# Patient Record
Sex: Male | Born: 1960 | Race: White | Hispanic: No | Marital: Married | Smoking: Never smoker
Health system: Southern US, Community
[De-identification: ages and names within clinical notes are randomized; demographics above are authoritative.]

## PROBLEM LIST (undated history)

## (undated) DIAGNOSIS — I639 Cerebral infarction, unspecified: Secondary | ICD-10-CM

## (undated) DIAGNOSIS — H3320 Serous retinal detachment, unspecified eye: Secondary | ICD-10-CM

## (undated) DIAGNOSIS — J349 Unspecified disorder of nose and nasal sinuses: Secondary | ICD-10-CM

## (undated) DIAGNOSIS — R002 Palpitations: Secondary | ICD-10-CM

## (undated) DIAGNOSIS — H269 Unspecified cataract: Secondary | ICD-10-CM

## (undated) DIAGNOSIS — G459 Transient cerebral ischemic attack, unspecified: Secondary | ICD-10-CM

## (undated) HISTORY — PX: RETINAL DETACHMENT SURGERY: SHX105

## (undated) HISTORY — PX: HAND SURGERY: SHX662

## (undated) HISTORY — PX: CATARACT EXTRACTION: SUR2

## (undated) HISTORY — PX: EYE SURGERY: SHX253

---

## 2013-02-02 ENCOUNTER — Ambulatory Visit: Payer: Self-pay | Admitting: Ophthalmology

## 2014-04-17 ENCOUNTER — Emergency Department: Payer: Self-pay | Admitting: Emergency Medicine

## 2014-04-17 LAB — CBC WITH DIFFERENTIAL/PLATELET
Basophil #: 0.1 10*3/uL (ref 0.0–0.1)
Basophil %: 0.3 %
EOS ABS: 0.1 10*3/uL (ref 0.0–0.7)
EOS PCT: 0.4 %
HCT: 53 % — AB (ref 40.0–52.0)
HGB: 17.1 g/dL (ref 13.0–18.0)
LYMPHS ABS: 0.5 10*3/uL — AB (ref 1.0–3.6)
Lymphocyte %: 2.4 %
MCH: 28 pg (ref 26.0–34.0)
MCHC: 32.3 g/dL (ref 32.0–36.0)
MCV: 87 fL (ref 80–100)
MONO ABS: 0.9 x10 3/mm (ref 0.2–1.0)
Monocyte %: 4.9 %
NEUTROS PCT: 92 %
Neutrophil #: 17.5 10*3/uL — ABNORMAL HIGH (ref 1.4–6.5)
PLATELETS: 309 10*3/uL (ref 150–440)
RBC: 6.1 10*6/uL — AB (ref 4.40–5.90)
RDW: 12.9 % (ref 11.5–14.5)
WBC: 19.1 10*3/uL — ABNORMAL HIGH (ref 3.8–10.6)

## 2014-04-17 LAB — COMPREHENSIVE METABOLIC PANEL
ALBUMIN: 4.6 g/dL (ref 3.4–5.0)
ALT: 27 U/L
ANION GAP: 15 (ref 7–16)
AST: 24 U/L (ref 15–37)
Alkaline Phosphatase: 80 U/L
BUN: 24 mg/dL — ABNORMAL HIGH (ref 7–18)
Bilirubin,Total: 0.9 mg/dL (ref 0.2–1.0)
Calcium, Total: 9.8 mg/dL (ref 8.5–10.1)
Chloride: 104 mmol/L (ref 98–107)
Co2: 20 mmol/L — ABNORMAL LOW (ref 21–32)
Creatinine: 1.39 mg/dL — ABNORMAL HIGH (ref 0.60–1.30)
EGFR (Non-African Amer.): 57 — ABNORMAL LOW
Glucose: 190 mg/dL — ABNORMAL HIGH (ref 65–99)
OSMOLALITY: 287 (ref 275–301)
Potassium: 3.9 mmol/L (ref 3.5–5.1)
Sodium: 139 mmol/L (ref 136–145)
TOTAL PROTEIN: 8.4 g/dL — AB (ref 6.4–8.2)

## 2014-04-17 LAB — TROPONIN I

## 2014-04-17 LAB — LIPASE, BLOOD: Lipase: 239 U/L (ref 73–393)

## 2015-06-21 ENCOUNTER — Ambulatory Visit: Payer: Self-pay | Admitting: Unknown Physician Specialty

## 2016-04-12 ENCOUNTER — Encounter: Payer: Self-pay | Admitting: Emergency Medicine

## 2016-04-12 ENCOUNTER — Emergency Department
Admission: EM | Admit: 2016-04-12 | Discharge: 2016-04-12 | Payer: BLUE CROSS/BLUE SHIELD | Attending: Emergency Medicine | Admitting: Emergency Medicine

## 2016-04-12 DIAGNOSIS — H5340 Unspecified visual field defects: Secondary | ICD-10-CM

## 2016-04-12 DIAGNOSIS — H5460 Unqualified visual loss, one eye, unspecified: Secondary | ICD-10-CM | POA: Diagnosis present

## 2016-04-12 HISTORY — DX: Serous retinal detachment, unspecified eye: H33.20

## 2016-04-12 NOTE — ED Triage Notes (Signed)
L eye partial loss of vision began last night, has history of detatched retinas.

## 2016-04-12 NOTE — ED Notes (Signed)
Pt D/C into the care of his wife. This RN discussed with patient the necessity to go straight to [facility] for follow up. This RN also instructed patient not to eat or drink anything until further evaluation at [facility] and not to drive due to the eye patch. Pt's wife at bedside for D/C instructions, pt states understanding at this time. During D/C patient noted to be angry when this RN asked for final set of vitals and to sign consent to transfer. Pt states "You just did that why do you have to do it again?" This RN explained policy to patient who states understanding.

## 2016-04-12 NOTE — ED Provider Notes (Addendum)
[facility] Emergency Department Provider Note  ____________________________________________   I have reviewed the triage vital signs and the nursing notes.   HISTORY  Chief Complaint Loss of Vision    HPI Chris Shelton is a 54 y.o. male who presents today with a partial vision loss in the left eye. Patient is otherwise a baseline healthy person. He states that he was in his normal state of health last night when he began to see floaters" lightening" in the left eye similar to when he had retinal detachment on the right. He has had 2 different surgeries on the right eye for retinal detachment. His baseline vision is about 2030 2040 on the right and 20/30 on the left. This painless loss of vision progressed overnight and now he has a part of his vision missing specifically on the upper medial quadrant of the left eye. He denies any loss of vision the right eye or any other complaints the right eye itself. He denies any focal numbness or weakness or difficulty speaking headache or head trauma. No vomiting.    Past Medical History:  Diagnosis Date  . Detached retina     There are no active problems to display for this patient.   Past Surgical History:  Procedure Laterality Date  . EYE SURGERY      Prior to Admission medications   Not on File    Allergies Penicillins  No family history on file.  Social History Social History  Substance Use Topics  . Smoking status: Not on file  . Smokeless tobacco: Not on file  . Alcohol use Not on file    Review of Systems Constitutional: No fever/chills Eyes: No visual changes. ENT: No sore throat. No stiff neck no neck pain Cardiovascular: Denies chest pain. Respiratory: Denies shortness of breath. Gastrointestinal:   no vomiting.  No diarrhea.  No constipation. Genitourinary: Negative for dysuria. Musculoskeletal: Negative lower extremity swelling Skin: Negative for rash. Neurological: Negative for  severe headaches, focal weakness or numbness. 10-point ROS otherwise negative.  ____________________________________________   PHYSICAL EXAM:  VITAL SIGNS: ED Triage Vitals  Enc Vitals Group     BP 04/12/16 1235 (!) 141/68     Pulse Rate 04/12/16 1235 65     Resp 04/12/16 1235 20     Temp 04/12/16 1235 98.1 F (36.7 C)     Temp Source 04/12/16 1235 Oral     SpO2 04/12/16 1235 97 %     Weight 04/12/16 1237 225 lb (102.1 kg)     Height 04/12/16 1237 5\' 9"  (1.753 m)     Head Circumference --      Peak Flow --      Pain Score 04/12/16 1237 1     Pain Loc --      Pain Edu? --      Excl. in GC? --     Constitutional: Alert and oriented. Well appearing and in no acute distress. Eyes: Conjunctivae are normal. PERRL. EOMI.  Endoscopic exam is limited but no obvious pathology noted without dilation  Head: Atraumatic. Nose: No congestion/rhinnorhea. Mouth/Throat: Mucous membranes are moist.  Oropharynx non-erythematous. Neck: No stridor.   Nontender with no meningismus Cardiovascular: Normal rate, regular rhythm. Grossly normal heart sounds.  Good peripheral circulation. Respiratory: Normal respiratory effort.  No retractions. Lungs CTAB. Abdominal: Soft and nontender. No distention. No guarding no rebound Back:  There is no focal tenderness or step off.  there is no midline tenderness there are no lesions  noted. there is no CVA tenderness Musculoskeletal: No lower extremity tenderness, no upper extremity tenderness. No joint effusions, no DVT signs strong distal pulses no edema Neurologic:  Cranial nerves II through XII are grossly intact 5 out of 5 strength bilateral upper and lower extremity. Finger to nose within normal limits heel to shin within normal limits, speech is normal with no word finding difficulty or dysarthria, reflexes symmetric, pupils are equally round and reactive to light, there is no pronator drift, sensation is normal, vision is intact to confrontation in all  fields except for the medial upper quadrant of the left eye, gait is deferred, there is no nystagmus,  Skin:  Skin is warm, dry and intact. No rash noted. Psychiatric: Mood and affect are normal. Speech and behavior are normal.  ____________________________________________   LABS (all labs ordered are listed, but only abnormal results are displayed)  Labs Reviewed - No data to display ____________________________________________  EKG  I personally interpreted any EKGs ordered by me or triage  ____________________________________________  RADIOLOGY  I reviewed any imaging ordered by me or triage that were performed during my shift and, if possible, patient and/or family made aware of any abnormal findings. ____________________________________________   PROCEDURES  Procedure(s) performed: None  Procedures  Critical Care performed: None  ____________________________________________   INITIAL IMPRESSION / ASSESSMENT AND PLAN / ED COURSE  Pertinent labs & imaging results that were available during my care of the patient were reviewed by me and considered in my medical decision making (see chart for details).   pt with a unilateral painless loss of vision the the medial upper visual field of the left eye assoc with floaters and lightning  with no other neurological finding. I d/w Dr. Melanie CrazierPorfillio, who agrees that this is likely retinal detachment, but as they no longer have a retinal surgeon they suggested transfer. I [facility] to [facility] and their retinal specialist is not available.  They will call be back if they can be of help.  Pt in nad.  His vision at this time is 20/40 R 20/50 L   Low suspicion for cva and he would be outside the window for tpa even if it were.     ----------------------------------------- 2:10 PM on 04/12/2016 ----------------------------------------- [facility] that [facility] call [facility].  ----------------------------------------- 2:25  PM on 04/12/2016 -----------------------------------------  D/w daniel lattin md of [facility] ophthalmology who agrees with management, would like an eye shield and accepts he patient ED to ED transfer.  ----------------------------------------- 2:28 PM on 04/12/2016 -----------------------------------------  Patient has been given an eye shield, he would prefer to go by private vehicle he does not wish to go by ambulance. This is not unreasonable. Patient's wife will drive him. We will still fill out [facility] paperwork. He will essentially however be discharged from us with instructions to follow up immediately with them. The patient is calling his wife for a ride. He is no acute distress with no change in his status.  Clinical Course   ____________________________________________   FINAL CLINICAL IMPRESSION(S) / ED DIAGNOSES  Final diagnoses:  None      This chart was dictated using voice recognition software.  Despite best efforts to proofread,  errors can occur which can change meaning.      Jeanmarie PlantJames A Jentry Mcqueary, MD 04/12/16 1343    Jeanmarie PlantJames A Yossi Hinchman, MD 04/12/16 1410    Jeanmarie PlantJames A Joy Reiger, MD 04/12/16 1429    Jeanmarie PlantJames A Alesha Jaffee, MD 04/12/16 2043

## 2016-04-12 NOTE — Discharge Instructions (Signed)
Go directly to [facility] Department and make them aware that you are requested in transfer by ophthalmology for possible retinal detachment. If you have any new or worrisome symptoms on the way, seek emergency care. Do not drive. As they may need to do surgery, do not stop to eat or drink.

## 2016-04-12 NOTE — ED Notes (Signed)
Eye pad and eye shield applied per dr Alphonzo Lemmingsmcshane request

## 2016-04-12 NOTE — ED Notes (Signed)
This RN to bedside to inform patient that per MD he is not to eat or drink until he is evaluated at [facility], and also that he will need to wait until his wife arrives before he can leave due to his driving restrictions. Pt noted to be agitated at this time stating, "I'm just ready to leave, I'm just ready to get this over with". Pt noted to continue to wait in room, this RN informed him as soon as his wife arrived to drive him he would receive his D/C paperwork.

## 2017-06-23 DIAGNOSIS — H269 Unspecified cataract: Secondary | ICD-10-CM

## 2017-06-23 DIAGNOSIS — J349 Unspecified disorder of nose and nasal sinuses: Secondary | ICD-10-CM

## 2017-06-23 HISTORY — DX: Unspecified disorder of nose and nasal sinuses: J34.9

## 2017-06-23 HISTORY — DX: Unspecified cataract: H26.9

## 2017-08-17 ENCOUNTER — Encounter: Payer: Self-pay | Admitting: *Deleted

## 2017-08-25 ENCOUNTER — Encounter
Admission: RE | Disposition: A | Discharge status: Home / Self Care | Payer: Self-pay | Source: Ambulatory Visit | Attending: Ophthalmology

## 2017-08-25 ENCOUNTER — Ambulatory Visit
Admission: RE | Admit: 2017-08-25 | Discharge: 2017-08-25 | Disposition: A | Discharge status: Home / Self Care | Payer: BLUE CROSS/BLUE SHIELD | Source: Ambulatory Visit | Attending: Ophthalmology | Admitting: Ophthalmology

## 2017-08-25 ENCOUNTER — Ambulatory Visit: Payer: BLUE CROSS/BLUE SHIELD | Admitting: Certified Registered Nurse Anesthetist

## 2017-08-25 DIAGNOSIS — H2512 Age-related nuclear cataract, left eye: Secondary | ICD-10-CM | POA: Diagnosis present

## 2017-08-25 DIAGNOSIS — F419 Anxiety disorder, unspecified: Secondary | ICD-10-CM | POA: Diagnosis not present

## 2017-08-25 DIAGNOSIS — Z880 Allergy status to penicillin: Secondary | ICD-10-CM | POA: Insufficient documentation

## 2017-08-25 HISTORY — PX: CATARACT EXTRACTION W/PHACO: SHX586

## 2017-08-25 HISTORY — DX: Unspecified cataract: H26.9

## 2017-08-25 HISTORY — DX: Unspecified disorder of nose and nasal sinuses: J34.9

## 2017-08-25 HISTORY — DX: Palpitations: R00.2

## 2017-08-25 SURGERY — PHACOEMULSIFICATION, CATARACT, WITH IOL INSERTION
Anesthesia: Monitor Anesthesia Care | Site: Eye | Laterality: Left | Wound class: Clean

## 2017-08-25 MED ORDER — CARBACHOL 0.01 % IO SOLN
INTRAOCULAR | Status: DC | PRN
  Administered 2017-08-25: .2 mL via INTRAOCULAR

## 2017-08-25 MED ORDER — LIDOCAINE HCL (PF) 4 % IJ SOLN
INTRAMUSCULAR | Status: AC
  Filled 2017-08-25: qty 5

## 2017-08-25 MED ORDER — ARMC OPHTHALMIC DILATING DROPS
OPHTHALMIC | Status: AC
  Administered 2017-08-25: 1 via OPHTHALMIC
  Filled 2017-08-25: qty 0.4

## 2017-08-25 MED ORDER — POVIDONE-IODINE 5 % OP SOLN
OPHTHALMIC | Status: DC | PRN
  Administered 2017-08-25: 1 via OPHTHALMIC

## 2017-08-25 MED ORDER — ARMC OPHTHALMIC DILATING DROPS
1.0000 "application " | OPHTHALMIC | Status: AC
  Administered 2017-08-25 (×3): 1 via OPHTHALMIC

## 2017-08-25 MED ORDER — TRYPAN BLUE 0.06 % OP SOLN
OPHTHALMIC | Status: DC | PRN
  Administered 2017-08-25: 0.5 mL via INTRAOCULAR

## 2017-08-25 MED ORDER — LIDOCAINE HCL (PF) 4 % IJ SOLN
INTRAOCULAR | Status: DC | PRN
  Administered 2017-08-25: .1 mL via OPHTHALMIC

## 2017-08-25 MED ORDER — EPINEPHRINE PF 1 MG/ML IJ SOLN
INTRAOCULAR | Status: DC | PRN
  Administered 2017-08-25: 250 mL via OPHTHALMIC

## 2017-08-25 MED ORDER — MIDAZOLAM HCL 2 MG/2ML IJ SOLN
INTRAMUSCULAR | Status: DC | PRN
  Administered 2017-08-25 (×2): 1 mg via INTRAVENOUS

## 2017-08-25 MED ORDER — NA CHONDROIT SULF-NA HYALURON 40-17 MG/ML IO SOLN
INTRAOCULAR | Status: AC
  Filled 2017-08-25: qty 1

## 2017-08-25 MED ORDER — MOXIFLOXACIN HCL 0.5 % OP SOLN
OPHTHALMIC | Status: AC
  Filled 2017-08-25: qty 3

## 2017-08-25 MED ORDER — MIDAZOLAM HCL 2 MG/2ML IJ SOLN
INTRAMUSCULAR | Status: AC
  Filled 2017-08-25: qty 2

## 2017-08-25 MED ORDER — EPINEPHRINE PF 1 MG/ML IJ SOLN
INTRAMUSCULAR | Status: AC
  Filled 2017-08-25: qty 2

## 2017-08-25 MED ORDER — MOXIFLOXACIN HCL 0.5 % OP SOLN: 1.0000 [drp] | Freq: Once | OPHTHALMIC | Status: DC

## 2017-08-25 MED ORDER — MOXIFLOXACIN HCL 0.5 % OP SOLN
OPHTHALMIC | Status: DC | PRN
  Administered 2017-08-25: 0.2 mL via OPHTHALMIC

## 2017-08-25 MED ORDER — POVIDONE-IODINE 5 % OP SOLN
OPHTHALMIC | Status: AC
  Filled 2017-08-25: qty 30

## 2017-08-25 MED ORDER — TRYPAN BLUE 0.06 % OP SOLN
OPHTHALMIC | Status: AC
  Filled 2017-08-25: qty 0.5

## 2017-08-25 MED ORDER — NA CHONDROIT SULF-NA HYALURON 40-17 MG/ML IO SOLN
INTRAOCULAR | Status: DC | PRN
  Administered 2017-08-25: 1 mL via INTRAOCULAR

## 2017-08-25 MED ORDER — SODIUM CHLORIDE 0.9 % IV SOLN
INTRAVENOUS | Status: DC
  Administered 2017-08-25: 11:00:00 via INTRAVENOUS

## 2017-08-25 SURGICAL SUPPLY — 20 items
CANNULA ANT/CHMB 27GA (MISCELLANEOUS) ×3 IMPLANT
FILTER MILLEX .045 (MISCELLANEOUS) ×1 IMPLANT
FLTR MILLEX .045 (MISCELLANEOUS) ×3
GLOVE BIO SURGEON STRL SZ8 (GLOVE) ×3 IMPLANT
GLOVE BIOGEL M 6.5 STRL (GLOVE) ×3 IMPLANT
GLOVE SURG LX 8.0 MICRO (GLOVE) ×2
GLOVE SURG LX STRL 8.0 MICRO (GLOVE) ×1 IMPLANT
GOWN STRL REUS W/ TWL LRG LVL3 (GOWN DISPOSABLE) ×2 IMPLANT
GOWN STRL REUS W/TWL LRG LVL3 (GOWN DISPOSABLE) ×4
LABEL CATARACT MEDS ST (LABEL) ×3 IMPLANT
LENS IOL TECNIS ITEC 18.5 (Intraocular Lens) ×3 IMPLANT
PACK CATARACT (MISCELLANEOUS) ×3 IMPLANT
PACK CATARACT BRASINGTON LX (MISCELLANEOUS) ×3 IMPLANT
PACK EYE AFTER SURG (MISCELLANEOUS) ×3 IMPLANT
SOL BSS BAG (MISCELLANEOUS) ×3
SOLUTION BSS BAG (MISCELLANEOUS) ×1 IMPLANT
SYR 3ML LL SCALE MARK (SYRINGE) ×6 IMPLANT
SYR 5ML LL (SYRINGE) ×3 IMPLANT
WATER STERILE IRR 250ML POUR (IV SOLUTION) ×3 IMPLANT
WIPE NON LINTING 3.25X3.25 (MISCELLANEOUS) ×3 IMPLANT

## 2017-08-25 NOTE — Anesthesia Post-op Follow-up Note (Signed)
Anesthesia QCDR form completed.        

## 2017-08-25 NOTE — H&P (Signed)
All labs reviewed. Abnormal studies sent to patients PCP when indicated.  Previous H&P reviewed, patient examined, there are NO CHANGES.  Chris Shelton Porfilio3/5/201911:54 AM

## 2017-08-25 NOTE — Anesthesia Preprocedure Evaluation (Addendum)
Anesthesia Evaluation  Patient identified by MRN, date of birth, ID band Patient awake    Reviewed: Allergy & Precautions, H&P , NPO status , Patient's Chart, lab work & pertinent test results, reviewed documented beta blocker date and time   History of Anesthesia Complications Negative for: history of anesthetic complications  Airway Mallampati: I  TM Distance: >3 FB Neck ROM: full    Dental  (+) Dental Advidsory Given, Teeth Intact, Poor Dentition, Missing   Pulmonary neg pulmonary ROS,           Cardiovascular Exercise Tolerance: Good negative cardio ROS       Neuro/Psych negative neurological ROS  negative psych ROS   GI/Hepatic negative GI ROS, Neg liver ROS,   Endo/Other  negative endocrine ROS  Renal/GU negative Renal ROS  negative genitourinary   Musculoskeletal   Abdominal   Peds  Hematology negative hematology ROS (+)   Anesthesia Other Findings Past Medical History: 2019: Cataracts, bilateral No date: Detached retina No date: Palpitations 2019: Sinus trouble   Reproductive/Obstetrics negative OB ROS                             Anesthesia Physical Anesthesia Plan  ASA: I  Anesthesia Plan: MAC   Post-op Pain Management:    Induction: Intravenous  PONV Risk Score and Plan:   Airway Management Planned: Nasal Cannula  Additional Equipment:   Intra-op Plan:   Post-operative Plan:   Informed Consent: I have reviewed the patients History and Physical, chart, labs and discussed the procedure including the risks, benefits and alternatives for the proposed anesthesia with the patient or authorized representative who has indicated his/her understanding and acceptance.   Dental Advisory Given  Plan Discussed with: Anesthesiologist, CRNA and Surgeon  Anesthesia Plan Comments:        Anesthesia Quick Evaluation

## 2017-08-25 NOTE — Op Note (Signed)
PREOPERATIVE DIAGNOSIS:  Nuclear sclerotic cataract of the left eye.   POSTOPERATIVE DIAGNOSIS:  Nuclear sclerotic cataract of the left eye.   OPERATIVE PROCEDURE: Procedure(s): CATARACT EXTRACTION PHACO AND INTRAOCULAR LENS PLACEMENT (IOC)   SURGEON:  Galen ManilaWilliam Baby Gieger, MD.   ANESTHESIA:  Anesthesiologist: Lenard SimmerKarenz, Andrew, MD CRNA: Dava NajjarFrazier, Susan, CRNA; Lily KocherPeralta, Nicole, CRNA  1.      Managed anesthesia care. 2.     0.261ml of Shugarcaine was instilled following the paracentesis   COMPLICATIONS:Vision Blue was used to stain the anterior capsule due to very poor/ no visualization of the red reflex.    TECHNIQUE:   Stop and chop   DESCRIPTION OF PROCEDURE:  The patient was examined and consented in the preoperative holding area where the aforementioned topical anesthesia was applied to the left eye and then brought back to the Operating Room where the left eye was prepped and draped in the usual sterile ophthalmic fashion and a lid speculum was placed. A paracentesis was created with the side port blade and the anterior chamber was filled with viscoelastic. A near clear corneal incision was performed with the steel keratome. A continuous curvilinear capsulorrhexis was performed with a cystotome followed by the capsulorrhexis forceps. Hydrodissection and hydrodelineation were carried out with BSS on a blunt cannula. The lens was removed in a stop and chop  technique and the remaining cortical material was removed with the irrigation-aspiration handpiece. The capsular bag was inflated with viscoelastic and the Technis [id number] lens was placed in the capsular bag without complication. The remaining viscoelastic was removed from the eye with the irrigation-aspiration handpiece. The wounds were hydrated. The anterior chamber was flushed with Miostat and the eye was inflated to physiologic pressure. 0.471ml Vigamox was placed in the anterior chamber. The wounds were found to be water tight. The eye was dressed  with Vigamox. The patient was given protective glasses to wear throughout the day and a shield with which to sleep tonight. The patient was also given drops with which to begin a drop regimen today and will follow-up with me in one day. Implant Name Type Inv. Item Serial No. Manufacturer Lot No. LRB No. Used  LENS IOL DIOP 18.5 - [id number] Intraocular Lens LENS IOL DIOP 18.5 [id number] AMO  Left 1    Procedure(s) with comments: CATARACT EXTRACTION PHACO AND INTRAOCULAR LENS PLACEMENT (IOC) (Left) - US   00:49.9 AP%   17.5 CDE   8.75 Fluid Pack Lot # [id number]  Electronically signed: Galen ManilaWilliam Eleisha Branscomb 08/25/2017 12:29 PM

## 2017-08-25 NOTE — Transfer of Care (Signed)
Immediate Anesthesia Transfer of Care Note  Patient: Chris Shelton  Procedure(s) Performed: CATARACT EXTRACTION PHACO AND INTRAOCULAR LENS PLACEMENT (IOC) (Left Eye)  Patient Location: PACU  Anesthesia Type:MAC  Level of Consciousness: awake, alert  and oriented  Airway & Oxygen Therapy: Patient Spontanous Breathing and Patient connected to nasal cannula oxygen  Post-op Assessment: Report given to RN and Post -op Vital signs reviewed and stable  Post vital signs: Reviewed and stable  Last Vitals:  Vitals:   08/25/17 1104 08/25/17 1232  BP: (!) 150/81 130/76  Pulse: 80 60  Resp: 18 18  Temp: 36.5 C 36.6 C  SpO2: 98% 99%    Last Pain:  Vitals:   08/25/17 1232  TempSrc: Oral         Complications: No apparent anesthesia complications

## 2017-08-25 NOTE — Anesthesia Postprocedure Evaluation (Signed)
Anesthesia Post Note  Patient: Chris Shelton  Procedure(s) Performed: CATARACT EXTRACTION PHACO AND INTRAOCULAR LENS PLACEMENT (IOC) (Left Eye)  Patient location during evaluation: PACU Anesthesia Type: MAC Level of consciousness: awake and alert Pain management: pain level controlled Vital Signs Assessment: post-procedure vital signs reviewed and stable Respiratory status: spontaneous breathing, nonlabored ventilation, respiratory function stable and patient connected to nasal cannula oxygen Cardiovascular status: stable and blood pressure returned to baseline Postop Assessment: no apparent nausea or vomiting Anesthetic complications: no     Last Vitals:  Vitals:   08/25/17 1104 08/25/17 1232  BP: (!) 150/81 130/76  Pulse: 80 60  Resp: 18 18  Temp: 36.5 C 36.6 C  SpO2: 98% 99%    Last Pain:  Vitals:   08/25/17 1232  TempSrc: Oral                 Martha Clan

## 2017-08-25 NOTE — Discharge Instructions (Signed)
FOLLOW DR. PORFILIO'S POSTOP DISCHARGE INSTRUCTION SHEET AS REVIEWED. Eye Surgery Discharge Instructions  Expect mild scratchy sensation or mild soreness. DO NOT RUB YOUR EYE!  The day of surgery:  Minimal physical activity, but bed rest is not required  No reading, computer work, or close hand work  No bending, lifting, or straining.  May watch TV  For 24 hours:  No driving, legal decisions, or alcoholic beverages  Safety precautions  Eat anything you prefer: It is better to start with liquids, then soup then solid foods.  _____ Eye patch should be worn until postoperative exam tomorrow.  ____ Solar shield eyeglasses should be worn for comfort in the sunlight/patch while sleeping  Resume all regular medications including aspirin or Coumadin if these were discontinued prior to surgery. You may shower, bathe, shave, or wash your hair. Tylenol may be taken for mild discomfort.  Call your doctor if you experience significant pain, nausea, or vomiting, fever > 101 or other signs of infection. [phone] or [phone] Specific instructions:  Follow-up Information    Galen ManilaPorfilio, William, MD Follow up.   Specialty:  Ophthalmology Why:  08/26/17 @ 2:35 pm Contact information: [address] [place] [id number] [phone]

## 2017-09-22 ENCOUNTER — Encounter: Payer: Self-pay | Admitting: Ophthalmology

## 2019-02-27 ENCOUNTER — Emergency Department
Admission: EM | Admit: 2019-02-27 | Discharge: 2019-02-27 | Disposition: A | Discharge status: Home / Self Care | Payer: BC Managed Care – PPO | Attending: Emergency Medicine | Admitting: Emergency Medicine

## 2019-02-27 ENCOUNTER — Other Ambulatory Visit: Payer: Self-pay

## 2019-02-27 ENCOUNTER — Encounter: Payer: Self-pay | Admitting: Emergency Medicine

## 2019-02-27 DIAGNOSIS — T7840XA Allergy, unspecified, initial encounter: Secondary | ICD-10-CM | POA: Diagnosis not present

## 2019-02-27 DIAGNOSIS — H5789 Other specified disorders of eye and adnexa: Secondary | ICD-10-CM | POA: Diagnosis not present

## 2019-02-27 DIAGNOSIS — R220 Localized swelling, mass and lump, head: Secondary | ICD-10-CM | POA: Diagnosis present

## 2019-02-27 MED ORDER — FAMOTIDINE 20 MG PO TABS
20.0000 mg | ORAL_TABLET | Freq: Once | ORAL | Status: AC
  Administered 2019-02-27: 23:00:00 20 mg via ORAL
  Filled 2019-02-27: qty 1

## 2019-02-27 MED ORDER — DIPHENHYDRAMINE HCL 50 MG/ML IJ SOLN
50.0000 mg | Freq: Once | INTRAMUSCULAR | Status: AC
  Administered 2019-02-27: 23:00:00 50 mg via INTRAMUSCULAR
  Filled 2019-02-27: qty 1

## 2019-02-27 MED ORDER — PREDNISONE 50 MG PO TABS: 50.0000 mg | ORAL_TABLET | Freq: Every day | ORAL | 0 refills | Status: DC

## 2019-02-27 MED ORDER — AZELASTINE HCL 0.05 % OP SOLN: 1.0000 [drp] | Freq: Two times a day (BID) | OPHTHALMIC | 1 refills | Status: DC

## 2019-02-27 MED ORDER — DEXAMETHASONE SODIUM PHOSPHATE 10 MG/ML IJ SOLN
10.0000 mg | Freq: Once | INTRAMUSCULAR | Status: AC
  Administered 2019-02-27: 10 mg via INTRAMUSCULAR
  Filled 2019-02-27: qty 1

## 2019-02-27 NOTE — ED Notes (Signed)
No peripheral IV placed this visit.   Discharge instructions reviewed with patient. Questions fielded by this RN. Patient verbalizes understanding of instructions. Patient discharged home in stable condition per edp. No acute distress noted at time of discharge.

## 2019-02-27 NOTE — ED Triage Notes (Signed)
Pt arrives POV to triage with c/o facial swelling around his eyes x 3 hours. Pt states that he took some benadryl with no relief. Pt appears in NAD.

## 2019-02-27 NOTE — ED Provider Notes (Signed)
[facility] Emergency Department Provider Note  ____________________________________________  Time seen: Approximately 10:51 PM  I have reviewed the triage vital signs and the nursing notes.   HISTORY  Chief Complaint Facial Swelling    HPI Chris Shelton is a 58 y.o. male who presents the emergency department complaining of erythema and edema around both eyes.  Patient reports that symptoms began rapidly earlier today.  Patient denies any pain associated with it and states that the first symptom was noted by his family member when they identified increased swelling around his eye.  Patient originally had edema about both eyes but states that after taking some Benadryl the right side improved with residual edema of the left.  Patient was concerned and presented to emergency department for possible allergic reaction.  Patient reports that the only thing that he knows that he is allergic to is penicillin and has not been exposed to penicillin.  Patient denies any food or environmental allergies.  He denies any nasal congestion, sneezing.  No shortness of breath, edema about the mouth or tongue.  No coughing, wheezing, shortness of breath.  Other than Benadryl no medications prior to arrival.  No other complaints at this time.         Past Medical History:  Diagnosis Date  . Cataracts, bilateral 2019  . Detached retina   . Palpitations   . Sinus trouble 2019    There are no active problems to display for this patient.   Past Surgical History:  Procedure Laterality Date  . CATARACT EXTRACTION Right   . CATARACT EXTRACTION W/PHACO Left 08/25/2017   Procedure: CATARACT EXTRACTION PHACO AND INTRAOCULAR LENS PLACEMENT (IOC);  Surgeon: Galen Manila, MD;  Location: [facility] ORS;  Service: Ophthalmology;  Laterality: Left;  [place]   00:49.9 AP%   17.5 CDE   8.75 Fluid Pack Lot # [id number]  . EYE SURGERY    . HAND SURGERY     multiple surgeries  . RETINAL DETACHMENT  SURGERY Bilateral    right x 2 and left x 2    Prior to Admission medications   Medication Sig Start Date End Date Taking? Authorizing Provider  azelastine (OPTIVAR) 0.05 % ophthalmic solution Place 1 drop into the left eye 2 (two) times daily. 02/27/19   Samanthan Dugo, Delorise Royals, PA-C  predniSONE (DELTASONE) 50 MG tablet Take 1 tablet (50 mg total) by mouth daily with breakfast. 02/27/19   Keshauna Degraffenreid, Delorise Royals, PA-C    Allergies Penicillins  No family history on file.  Social History Social History   Tobacco Use  . Smoking status: Never Smoker  . Smokeless tobacco: Never Used  Substance Use Topics  . Alcohol use: No    Frequency: Never  . Drug use: No     Review of Systems  Constitutional: No fever/chills Eyes: No visual changes. No discharge ENT: No upper respiratory complaints. Cardiovascular: no chest pain. Respiratory: no cough. No SOB. Gastrointestinal: No abdominal pain.  No nausea, no vomiting.  No diarrhea.   Musculoskeletal: Negative for musculoskeletal pain. Skin: Swelling around both eyes Neurological: Negative for headaches, focal weakness or numbness. 10-point ROS otherwise negative.  ____________________________________________   PHYSICAL EXAM:  VITAL SIGNS: ED Triage Vitals  Enc Vitals Group     BP 02/27/19 2208 (!) 154/65     Pulse Rate 02/27/19 2208 64     Resp 02/27/19 2208 18     Temp 02/27/19 2207 98 F (36.7 C)     Temp Source 02/27/19 2207  Oral     SpO2 02/27/19 2208 98 %     Weight 02/27/19 2209 217 lb (98.4 kg)     Height 02/27/19 2209 5\' 9"  (1.753 m)     Head Circumference --      Peak Flow --      Pain Score 02/27/19 2209 0     Pain Loc --      Pain Edu? --      Excl. in [place]? --      Constitutional: Alert and oriented. Well appearing and in no acute distress. Eyes: Conjunctivae are normal. PERRL. EOMI. Head: Atraumatic.  Visualization of the face reveals edema about the left eye.  This is been compensating the lateral aspect of  the left eye.  No periorbital edema noted about the right.  Skin is minimally erythematous when compared with surrounding tissue.  Area is nontender to palpation with no palpable abnormalities.  No other visible abnormality to the face or skull. ENT:      Ears:       Nose: No congestion/rhinnorhea.      Mouth/Throat: Mucous membranes are moist.  Neck: No stridor.   Hematological/Lymphatic/Immunilogical: No cervical lymphadenopathy. Cardiovascular: Normal rate, regular rhythm. Normal S1 and S2.  Good peripheral circulation. Respiratory: Normal respiratory effort without tachypnea or retractions. Lungs CTAB. Good air entry to the bases with no decreased or absent breath sounds. Musculoskeletal: Full range of motion to all extremities. No gross deformities appreciated. Neurologic:  Normal speech and language. No gross focal neurologic deficits are appreciated.  Skin:  Skin is warm, dry and intact. No rash noted. Psychiatric: Mood and affect are normal. Speech and behavior are normal. Patient exhibits appropriate insight and judgement.   ____________________________________________   LABS (all labs ordered are listed, but only abnormal results are displayed)  Labs Reviewed - No data to display ____________________________________________  EKG   ____________________________________________  RADIOLOGY   No results found.  ____________________________________________    PROCEDURES  Procedure(s) performed:    Procedures    Medications  dexamethasone (DECADRON) injection 10 mg (has no administration in time range)  diphenhydrAMINE (BENADRYL) injection 50 mg (has no administration in time range)  famotidine (PEPCID) tablet 20 mg (has no administration in time range)     ____________________________________________   INITIAL IMPRESSION / ASSESSMENT AND PLAN / ED COURSE  Pertinent labs & imaging results that were available during my care of the patient were reviewed by  me and considered in my medical decision making (see chart for details).  Review of the [place] CSRS was performed in accordance of the [place] prior to dispensing any controlled drugs.           Patient's diagnosis is consistent with periorbital edema.  Patient presented to the emergency department complaining of swelling about both eyes.  I suspect that this is a localized reaction.  Differential included allergic reaction, ocular trauma, preseptal cellulitis, periorbital cellulitis.  No evidence of infection on exam.  The right side improved with Benadryl use prior to arrival with residual edema about the left eye.  Patient is given an injection of Decadron, diphenhydramine and oral famotidine..  Patient will be placed on short course of oral steroid, antihistamine eyedrops for symptom relief.  Return precautions are discussed at length with the patient to include pain about the left eye, worsening edema, worsening erythema, vision changes, headache, fevers or chills, shortness of breath, feeling of swelling of tongue or throat, wheezing or coughing.  Patient verbalizes understanding and agreement with  plan.  Follow-up with primary care as needed.  Patient is given ED precautions to return to the ED for any worsening or new symptoms.     ____________________________________________  FINAL CLINICAL IMPRESSION(S) / ED DIAGNOSES  Final diagnoses:  Allergic reaction, initial encounter  Eye swelling, bilateral      NEW MEDICATIONS STARTED DURING THIS VISIT:  ED Discharge Orders         Ordered    azelastine (OPTIVAR) 0.05 % ophthalmic solution  2 times daily     02/27/19 2256    predniSONE (DELTASONE) 50 MG tablet  Daily with breakfast     02/27/19 2256              This chart was dictated using voice recognition software/Dragon. Despite best efforts to proofread, errors can occur which can change the meaning. Any change was purely unintentional.    Racheal PatchesCuthriell, Zenith Kercheval D,  PA-C 02/27/19 2257    Phineas SemenGoodman, [place], MD 02/28/19 [phone]

## 2021-03-06 ENCOUNTER — Other Ambulatory Visit: Payer: Self-pay

## 2021-03-06 DIAGNOSIS — Z021 Encounter for pre-employment examination: Secondary | ICD-10-CM

## 2021-03-06 NOTE — Progress Notes (Signed)
Pt cleared pre-employment drug screen, HR notified.

## 2021-04-30 ENCOUNTER — Emergency Department: Payer: BC Managed Care – PPO

## 2021-04-30 ENCOUNTER — Other Ambulatory Visit: Payer: Self-pay

## 2021-04-30 ENCOUNTER — Emergency Department
Admission: EM | Admit: 2021-04-30 | Discharge: 2021-04-30 | Disposition: A | Discharge status: Home / Self Care | Payer: BC Managed Care – PPO | Attending: Emergency Medicine | Admitting: Emergency Medicine

## 2021-04-30 DIAGNOSIS — G459 Transient cerebral ischemic attack, unspecified: Secondary | ICD-10-CM | POA: Insufficient documentation

## 2021-04-30 DIAGNOSIS — H538 Other visual disturbances: Secondary | ICD-10-CM

## 2021-04-30 DIAGNOSIS — R110 Nausea: Secondary | ICD-10-CM | POA: Diagnosis not present

## 2021-04-30 DIAGNOSIS — R42 Dizziness and giddiness: Secondary | ICD-10-CM | POA: Diagnosis present

## 2021-04-30 LAB — PROTIME-INR
INR: 1 (ref 0.8–1.2)
Prothrombin Time: 13 seconds (ref 11.4–15.2)

## 2021-04-30 LAB — COMPREHENSIVE METABOLIC PANEL
ALT: 13 U/L (ref 0–44)
AST: 18 U/L (ref 15–41)
Albumin: 4.3 g/dL (ref 3.5–5.0)
Alkaline Phosphatase: 59 U/L (ref 38–126)
Anion gap: 8 (ref 5–15)
BUN: 20 mg/dL (ref 6–20)
CO2: 24 mmol/L (ref 22–32)
Calcium: 9.4 mg/dL (ref 8.9–10.3)
Chloride: 105 mmol/L (ref 98–111)
Creatinine, Ser: 0.98 mg/dL (ref 0.61–1.24)
GFR, Estimated: >60 mL/min (ref >60)
Glucose, Bld: 147 mg/dL — ABNORMAL HIGH (ref 70–99)
Potassium: 3.7 mmol/L (ref 3.5–5.1)
Sodium: 137 mmol/L (ref 135–145)
Total Bilirubin: 1.2 mg/dL (ref 0.3–1.2)
Total Protein: 7.2 g/dL (ref 6.5–8.1)

## 2021-04-30 LAB — CBC
HCT: 46.1 % (ref 39.0–52.0)
Hemoglobin: 15.6 g/dL (ref 13.0–17.0)
MCH: 29 pg (ref 26.0–34.0)
MCHC: 33.8 g/dL (ref 30.0–36.0)
MCV: 85.7 fL (ref 80.0–100.0)
Platelets: 237 10*3/uL (ref 150–400)
RBC: 5.38 MIL/uL (ref 4.22–5.81)
RDW: 12.2 % (ref 11.5–15.5)
WBC: 6.3 10*3/uL (ref 4.0–10.5)
nRBC: 0 % (ref 0.0–0.2)

## 2021-04-30 LAB — DIFFERENTIAL
Abs Immature Granulocytes: 0.02 10*3/uL (ref 0.00–0.07)
Basophils Absolute: 0.1 10*3/uL (ref 0.0–0.1)
Basophils Relative: 1 %
Eosinophils Absolute: 0.1 10*3/uL (ref 0.0–0.5)
Eosinophils Relative: 2 %
Immature Granulocytes: 0 %
Lymphocytes Relative: 25 %
Lymphs Abs: 1.6 10*3/uL (ref 0.7–4.0)
Monocytes Absolute: 0.3 10*3/uL (ref 0.1–1.0)
Monocytes Relative: 5 %
Neutro Abs: 4.2 10*3/uL (ref 1.7–7.7)
Neutrophils Relative %: 67 %

## 2021-04-30 LAB — APTT: aPTT: 27 seconds (ref 24–36)

## 2021-04-30 IMAGING — MR MR HEAD W/O CM
12 series · 48 of 48 positions shown · non-contrast
Comparison: Same day CT head.

CLINICAL DATA: Neuro deficit, acute, stroke suspected

EXAM:
MRI HEAD WITHOUT CONTRAST
TECHNIQUE: Multiplanar, multiecho pulse sequences of the brain and surrounding
structures were obtained without intravenous contrast.

[Series 5: ax dwi_tracew · axial · 3.0mm · 0.65mm/px · z∈[-93,+62]mm · 4 of 48 slices shown]
[im 1/48]
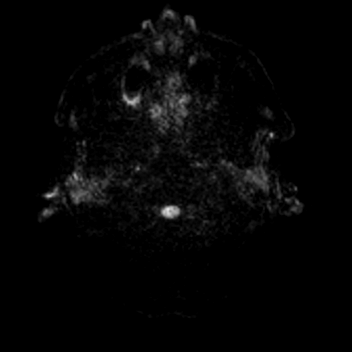
[im 16/48]
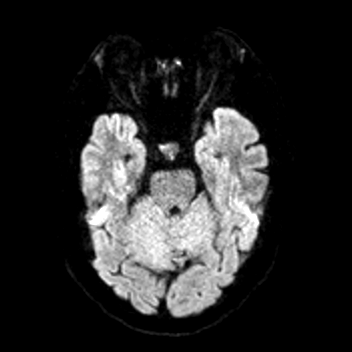
[im 32/48]
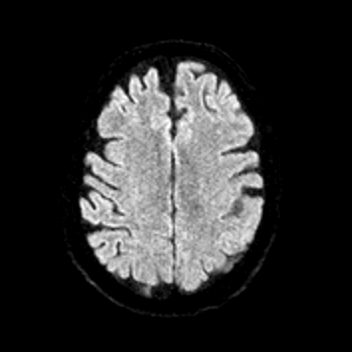
[im 48/48]
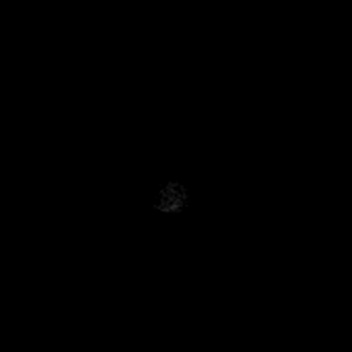

[Series 6: ax dwi_adc · axial · 3.0mm · 0.65mm/px · z∈[-93,+58]mm · 3 of 47 slices shown]
[im 1/47]
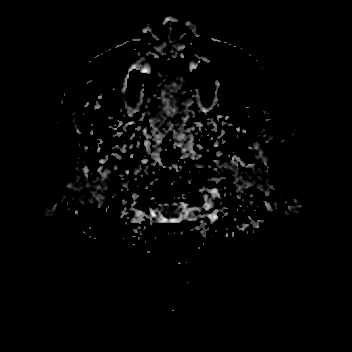
[im 24/47]
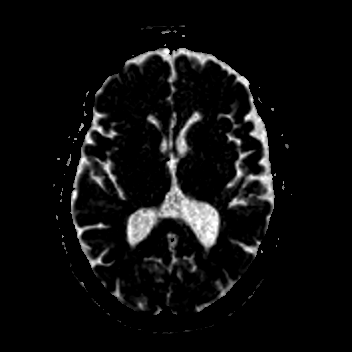
[im 47/47]
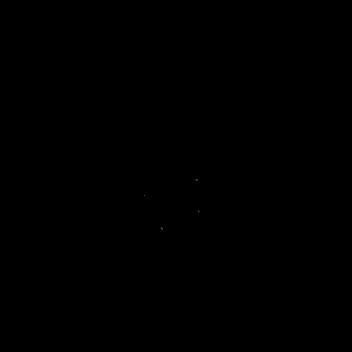

[Series 7: cor dwi_tracew · coronal · 5.0mm · 0.65mm/px · 3 of 40 slices shown]
[im 1/40]
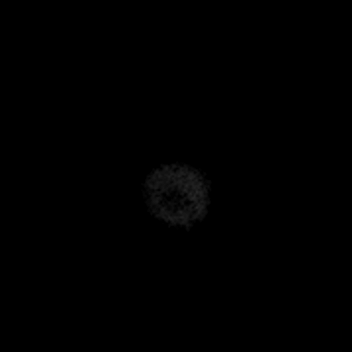
[im 20/40]
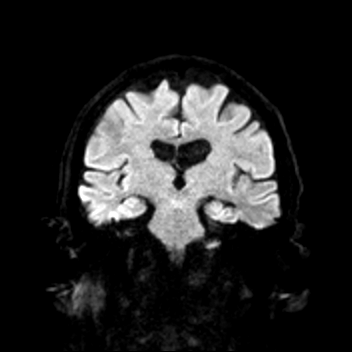
[im 40/40]
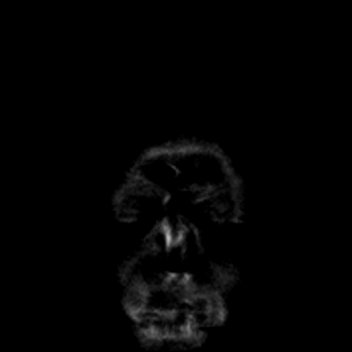

[Series 8: cor dwi_adc · coronal · 5.0mm · 0.65mm/px · 3 of 39 slices shown]
[im 1/39]
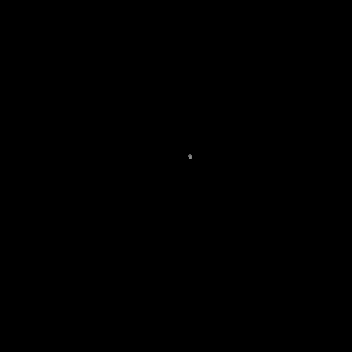
[im 20/39]
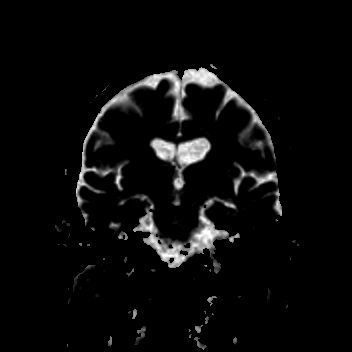
[im 39/39]
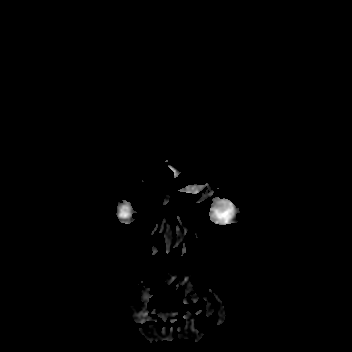

[Series 9: T1 · sagittal · 5.0mm · 0.62mm/px · 2 of 25 slices shown (1 of 2)]
[im 1/25]
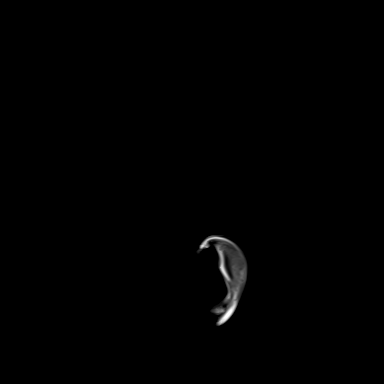
[im 25/25]
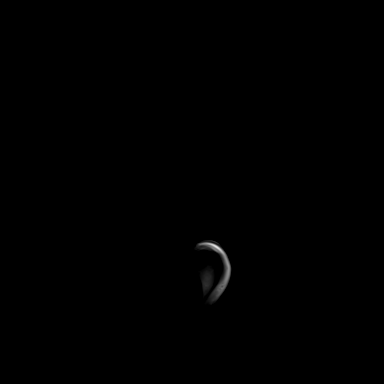

[Series 10: T2 · axial · 5.0mm · 0.53mm/px · z∈[-85,+58]mm · 2 of 25 slices shown (1 of 2)]
[im 1/25]
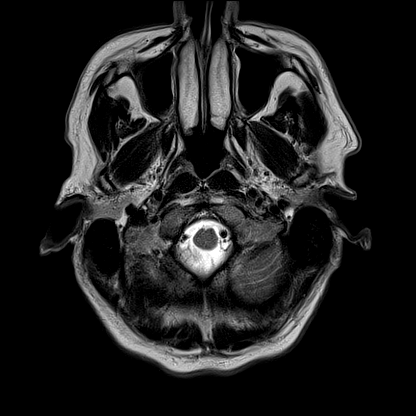
[im 25/25]
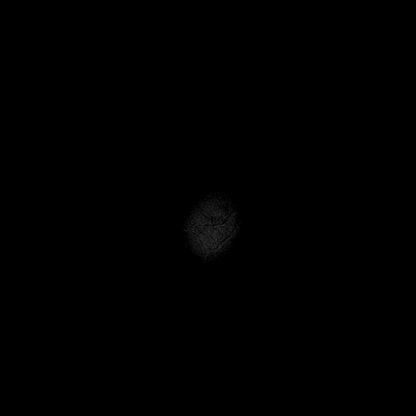

[Series 11: mag_images · axial · 3.0mm · 0.90mm/px · z∈[-101,+75]mm · 4 of 60 slices shown]
[im 1/60]
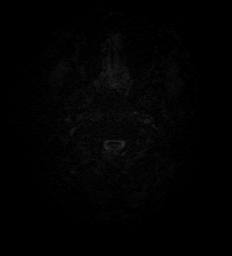
[im 20/60]
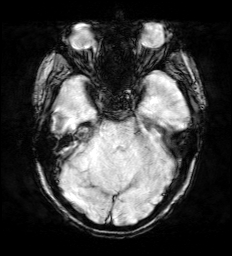
[im 40/60]
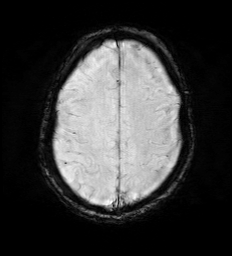
[im 60/60]
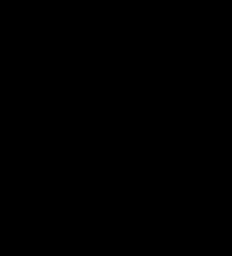

[Series 12: pha_images · axial · 3.0mm · 0.90mm/px · z∈[-101,+75]mm · 4 of 59 slices shown]
[im 1/59]
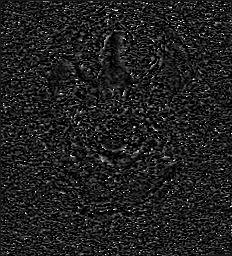
[im 20/59]
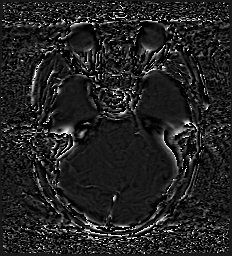
[im 39/59]
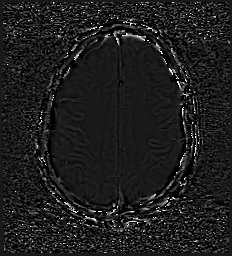
[im 59/59]
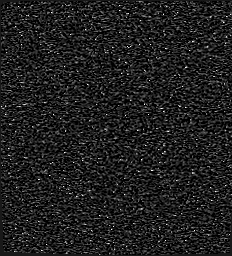

[Series 13: swi_images · axial · 3.0mm · 0.90mm/px · z∈[-101,+75]mm · 4 of 60 slices shown]
[im 1/60]
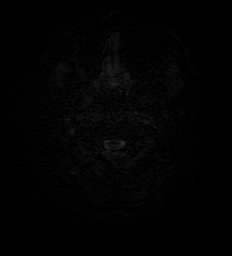
[im 20/60]
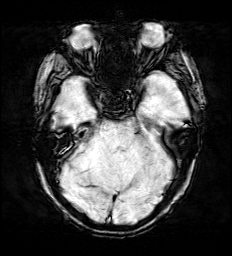
[im 40/60]
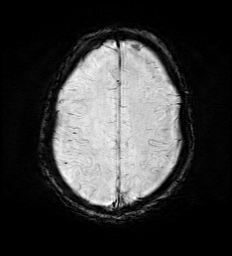
[im 60/60]
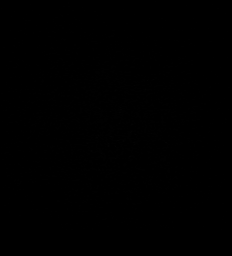

[Series 15: FLAIR · axial · 3.0mm · 0.53mm/px · z∈[-94,+67]mm · 4 of 55 slices shown]
[im 1/55]
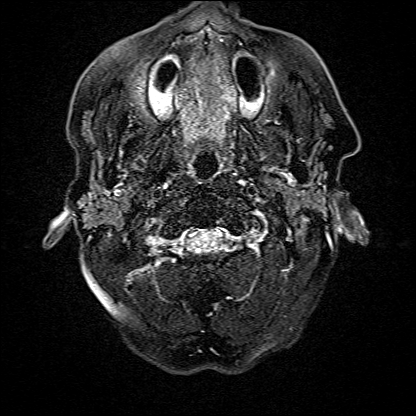
[im 19/55]
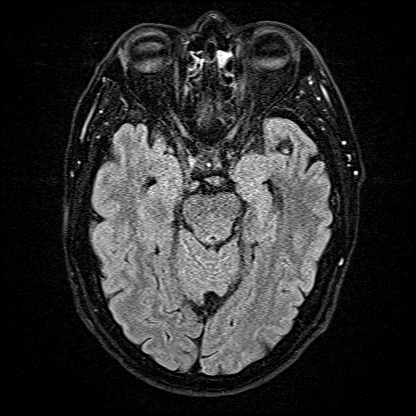
[im 37/55]
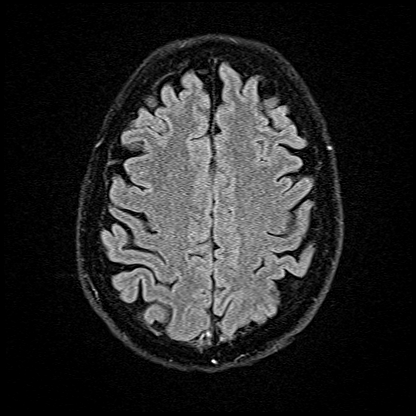
[im 55/55]
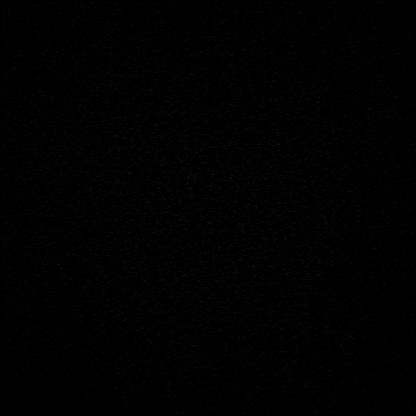

[Series 16: T1 · axial · 1.0mm · 0.98mm/px · z∈[-104,+70]mm · 13 of 176 slices shown (2 of 2)]
[im 1/176]
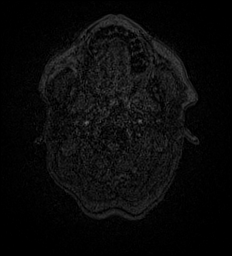
[im 15/176]
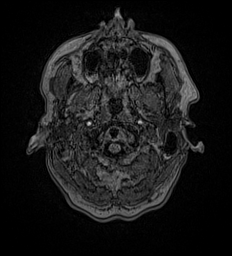
[im 30/176]
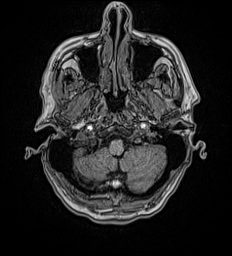
[im 44/176]
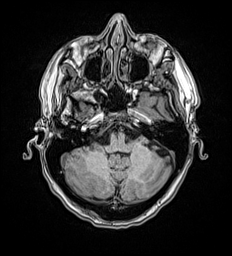
[im 59/176]
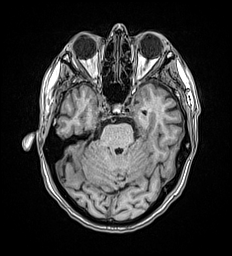
[im 73/176]
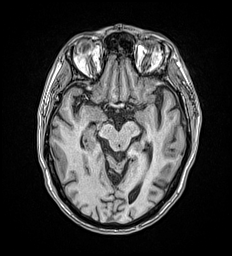
[im 88/176]
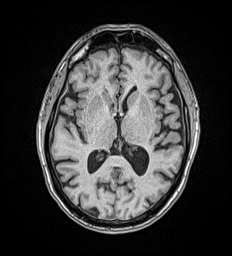
[im 103/176]
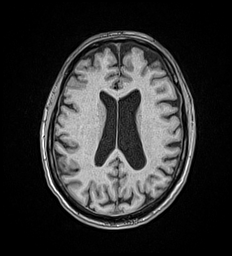
[im 117/176]
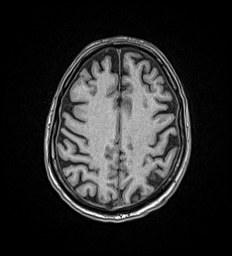
[im 132/176]
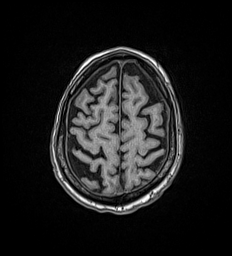
[im 146/176]
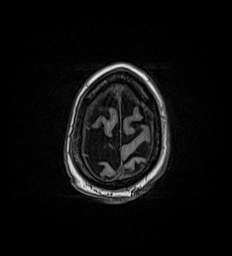
[im 161/176]
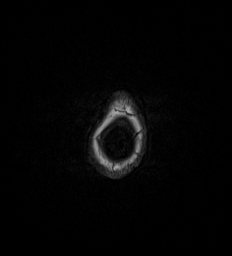
[im 176/176]
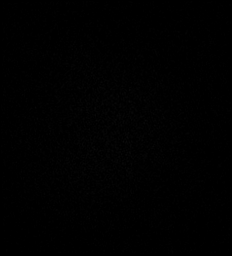

[Series 17: T2 · coronal · 5.0mm · 0.57mm/px · 2 of 30 slices shown (2 of 2)]
[im 1/30]
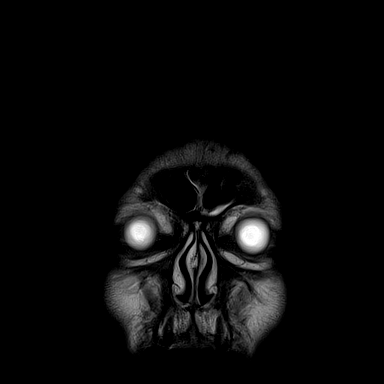
[im 30/30]
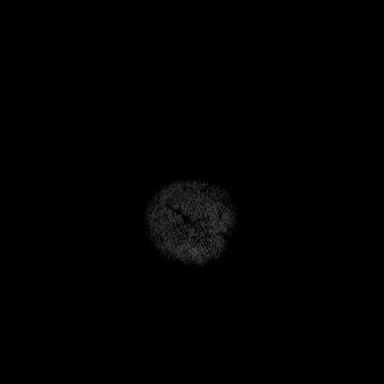

[48 of 48 positions shown; findings below may reference images not displayed]

FINDINGS: Brain: No acute infarction, hemorrhage, hydrocephalus, extra-axial
collection or mass lesion.

Vascular: Major arterial flow voids are maintained at the skull
base.

Skull and upper cervical spine: Normal marrow signal.

Sinuses/Orbits: Mild paranasal sinus mucosal thickening. No acute
orbital findings.

Other: No sizable mastoid effusions.
IMPRESSION: No evidence of acute intracranial abnormality, including acute
infarct.

## 2021-04-30 IMAGING — CT CT HEAD W/O CM
3 series · 16 of 47 positions shown, 19 images · non-contrast
Comparison: None.

CLINICAL DATA: Neuro deficit, acute, stroke suspected stroke like
symptoms, out of code stroke window. Blurry vision.

EXAM:
CT HEAD WITHOUT CONTRAST
TECHNIQUE: Contiguous axial images were obtained from the base of the skull
through the vertex without intravenous contrast.

[Series 2: head wo · axial · 0.45mm/px · z∈[+432,+567]mm · 10 of 33 slices shown, 13 images]
[im 3/33  brain]
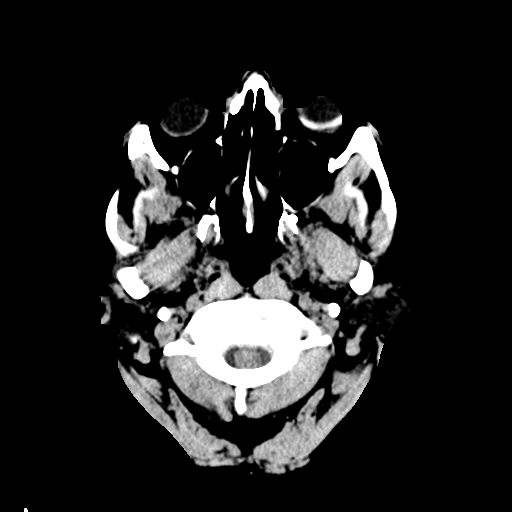
[im 3/33  bone]
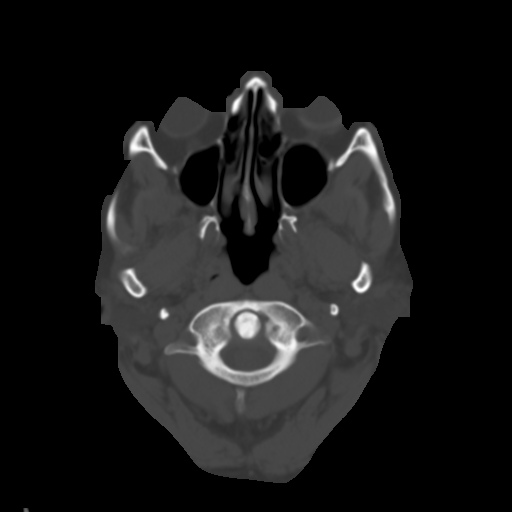
[im 6/33  brain]
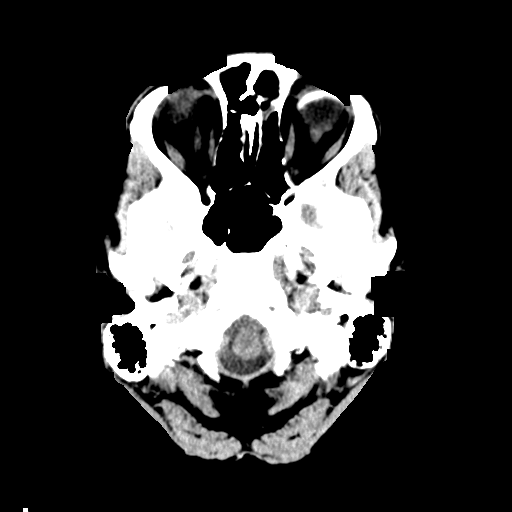
[im 9/33  brain]
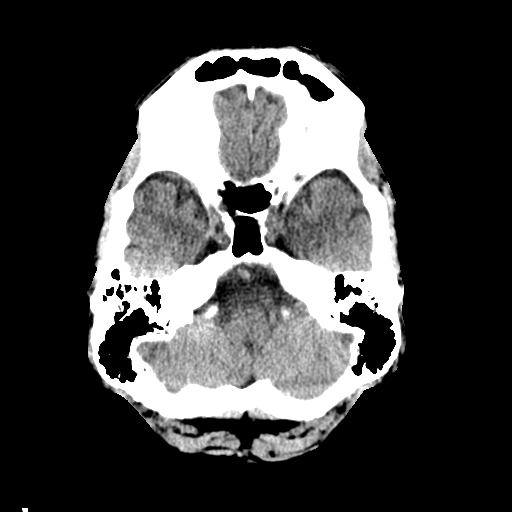
[im 12/33  brain]
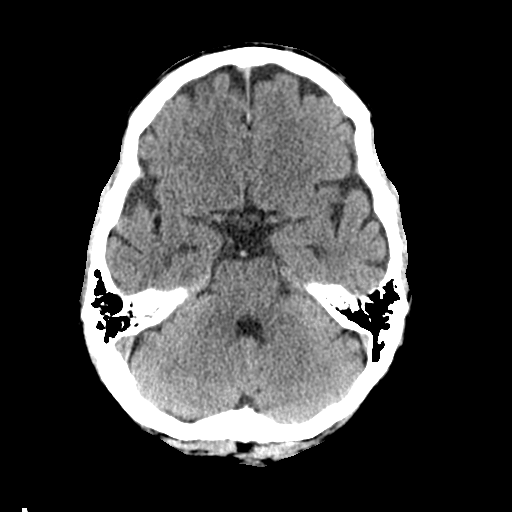
[im 15/33  brain]
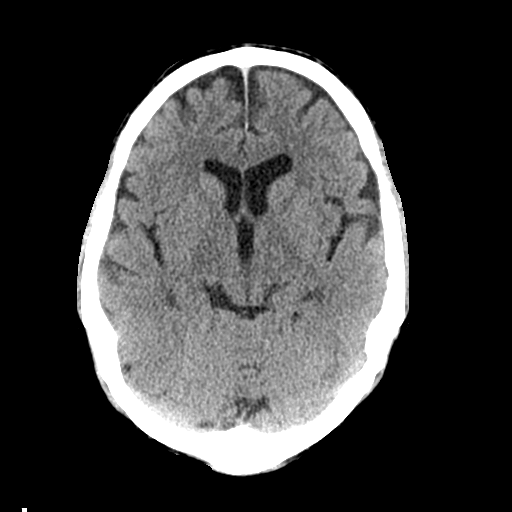
[im 15/33  bone]
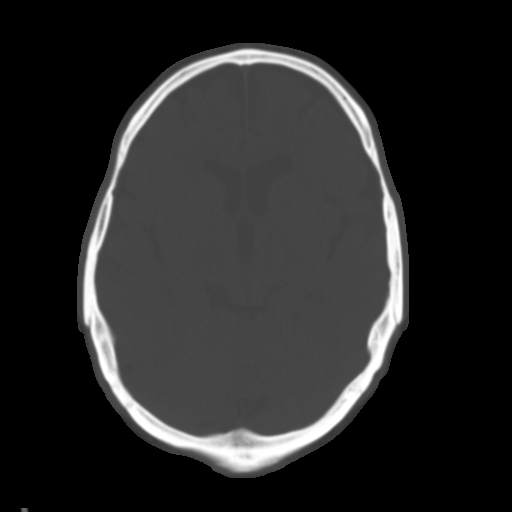
[im 18/33  brain]
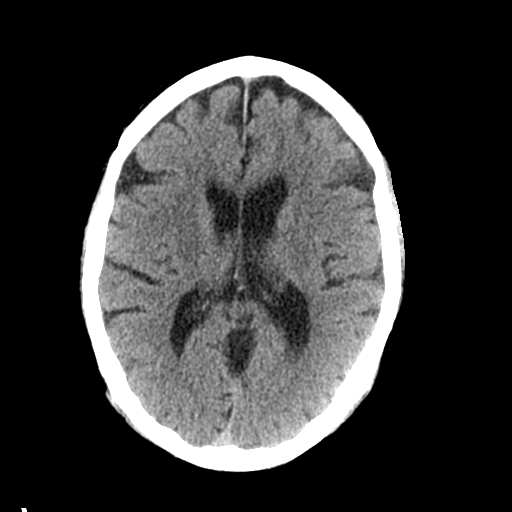
[im 21/33  brain]
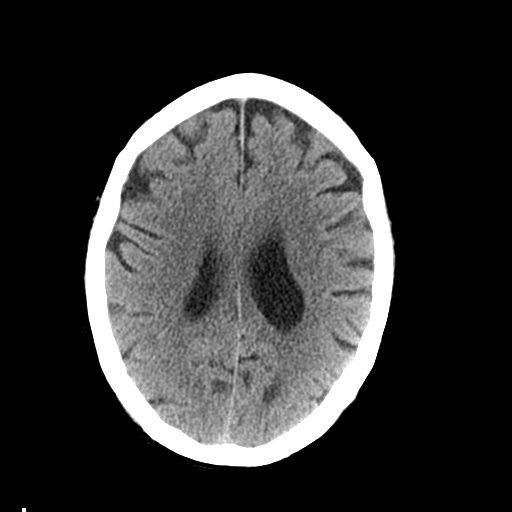
[im 25/33  brain]
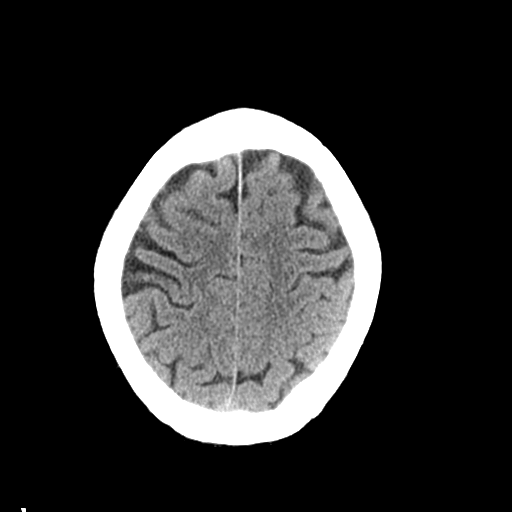
[im 27/33  brain]
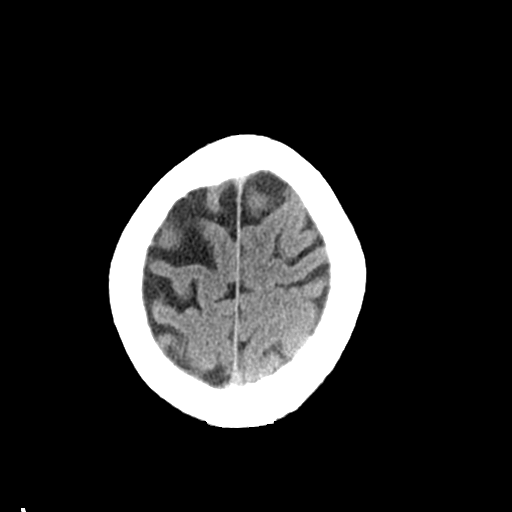
[im 27/33  bone]
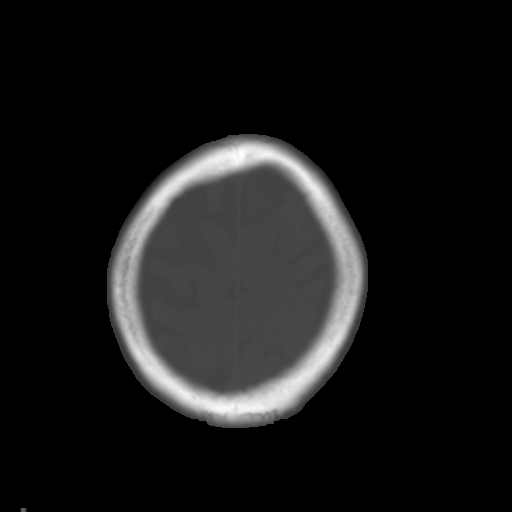
[im 30/33  brain]
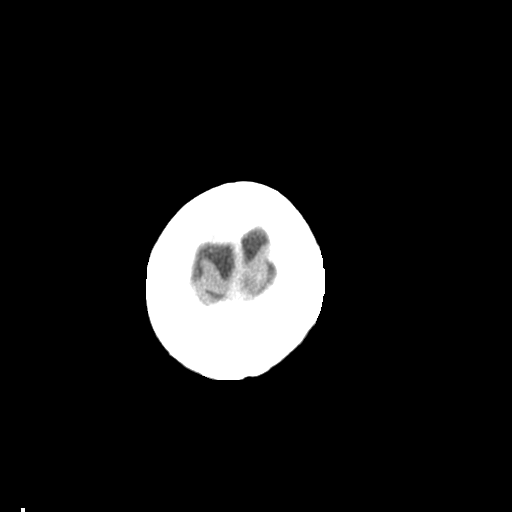

[Series 4: coronal soft tissue · coronal · 0.35mm/px · 3 of 72 slices shown]
[im 24/72  brain]
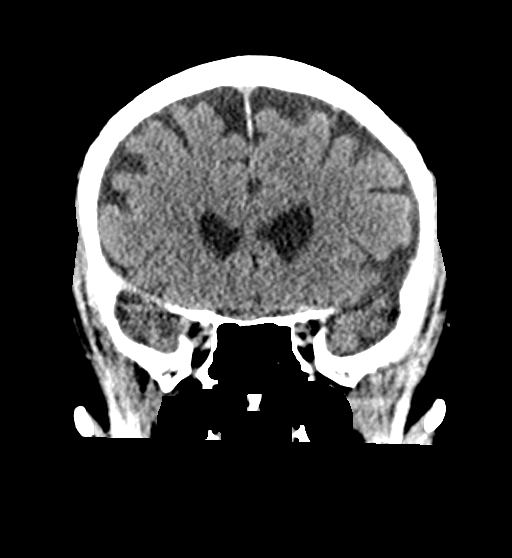
[im 32/72  brain]
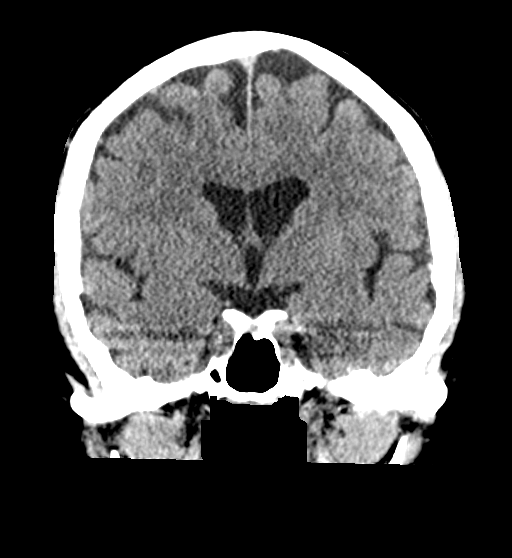
[im 40/72  brain]
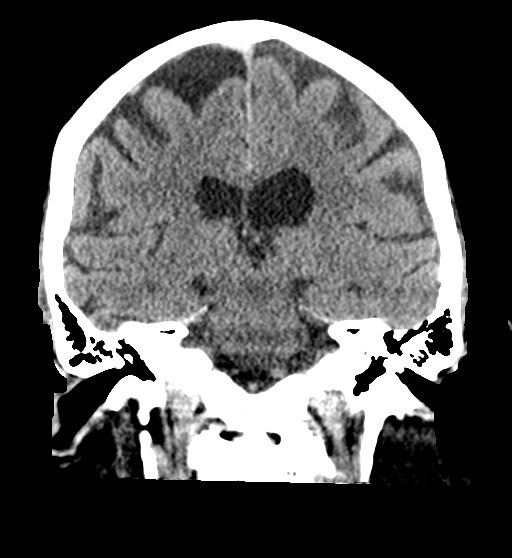

[Series 5: sagittal soft tissue · sagittal · 0.40mm/px · 3 of 57 slices shown]
[im 19/57  brain]
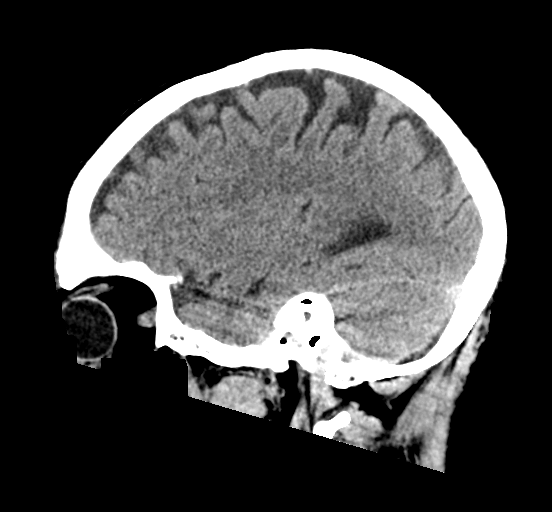
[im 29/57  brain]
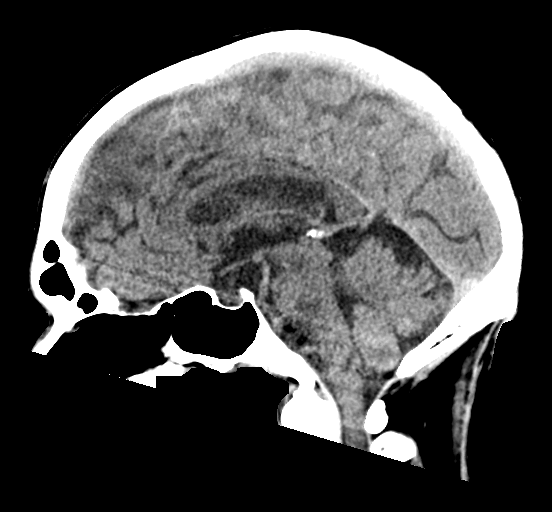
[im 38/57  brain]
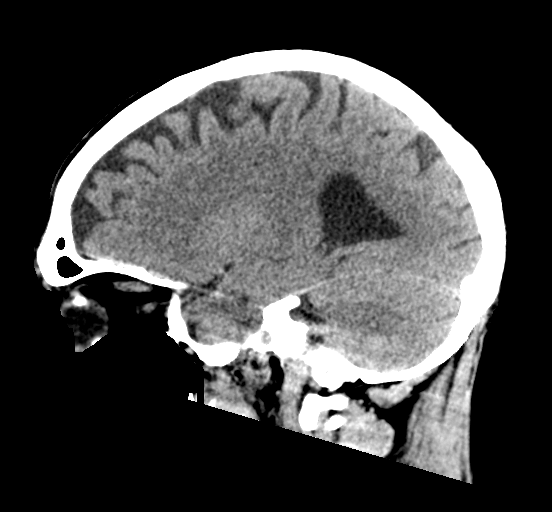

[16 of 47 positions shown; findings below may reference images not displayed]

FINDINGS: Brain: No evidence of parenchymal hemorrhage or extra-axial fluid
collection. No mass lesion, mass effect, or midline shift. No CT
evidence of acute infarction. Cerebral volume is age appropriate. No
ventriculomegaly.

Vascular: No acute abnormality.

Skull: No evidence of calvarial fracture.

Sinuses/Orbits: The visualized paranasal sinuses are essentially
clear.

Other:  The mastoid air cells are unopacified.
IMPRESSION: Negative head CT. No evidence of acute intracranial abnormality.

## 2021-04-30 MED ORDER — LORAZEPAM 2 MG/ML IJ SOLN: 1.0000 mg | INTRAMUSCULAR | Status: DC | PRN

## 2021-04-30 MED ORDER — DIPHENHYDRAMINE HCL 50 MG/ML IJ SOLN
25.0000 mg | Freq: Once | INTRAMUSCULAR | Status: AC
  Administered 2021-04-30: 25 mg via INTRAVENOUS
  Filled 2021-04-30: qty 1

## 2021-04-30 MED ORDER — PROCHLORPERAZINE EDISYLATE 10 MG/2ML IJ SOLN
10.0000 mg | Freq: Once | INTRAMUSCULAR | Status: AC
  Administered 2021-04-30: 10 mg via INTRAVENOUS
  Filled 2021-04-30: qty 2

## 2021-04-30 NOTE — ED Notes (Signed)
Pt is ambulatory to the restroom at this time. No assistance needed. Pt denies dizziness at this time and states he feels better.

## 2021-04-30 NOTE — ED Triage Notes (Signed)
Pt presents to ER c/o possible stroke.  Pt states he went to bed around 2300 last night and when he woke up, started having burning in his right eye, blurry vision, and was having difficulty explaining to his wife what he was feeling.  Pt denies any unilateral weakness.  No facial droop, slurred speech, or other neurological deficits noted.

## 2021-04-30 NOTE — ED Provider Notes (Signed)
[facility] Emergency Department Provider Note   ____________________________________________   Event Date/Time   First MD Initiated Contact with Patient 04/30/21 [phone]     (approximate)  I have reviewed the triage vital signs and the nursing notes.   HISTORY  Chief Complaint Dizziness    HPI Chris Shelton is a 60 y.o. male with past medical history of ocular migraines who presents to the ED complaining of dizziness.  Patient reports that around 6 AM he was woken from sleep with feeling of discomfort in his right eye and right ear.  He states it seemed like his vision was blurry in both eyes, right greater than left.  He also had some difficulty using his left arm, states it was difficult for him to reach up and touch his nose.  The symptoms lasted for about 5 minutes before resolving, he now describes some lingering nausea and dizziness.  He denies any symptoms affecting his right arm, right leg, or left leg.  His ocular migraines typically cause a kaleidoscope like sensation in his right eye, but he describes the symptoms today as different.  He does not take any blood thinners.        Past Medical History:  Diagnosis Date   Cataracts, bilateral 2019   Detached retina    Palpitations    Sinus trouble 2019    There are no problems to display for this patient.   Past Surgical History:  Procedure Laterality Date   CATARACT EXTRACTION Right    CATARACT EXTRACTION W/PHACO Left 08/25/2017   Procedure: CATARACT EXTRACTION PHACO AND INTRAOCULAR LENS PLACEMENT (IOC);  Surgeon: Galen Manila, MD;  Location: [facility] ORS;  Service: Ophthalmology;  Laterality: Left;  [place]   00:49.9 AP%   17.5 CDE   8.75 Fluid Pack Lot # [id number]   EYE SURGERY     HAND SURGERY     multiple surgeries   RETINAL DETACHMENT SURGERY Bilateral    right x 2 and left x 2    Prior to Admission medications   Medication Sig Start Date End Date Taking? Authorizing Provider   azelastine (OPTIVAR) 0.05 % ophthalmic solution Place 1 drop into the left eye 2 (two) times daily. 02/27/19   Cuthriell, Delorise Royals, PA-C  predniSONE (DELTASONE) 50 MG tablet Take 1 tablet (50 mg total) by mouth daily with breakfast. 02/27/19   Cuthriell, Delorise Royals, PA-C    Allergies Penicillins  History reviewed. No pertinent family history.  Social History Social History   Tobacco Use   Smoking status: Never   Smokeless tobacco: Never  Vaping Use   Vaping Use: Never used  Substance Use Topics   Alcohol use: No   Drug use: No    Review of Systems  Constitutional: No fever/chills Eyes: Positive for blurry vision visual changes. ENT: No sore throat. Cardiovascular: Denies chest pain. Respiratory: Denies shortness of breath. Gastrointestinal: No abdominal pain.  Positive for nausea, no vomiting.  No diarrhea.  No constipation. Genitourinary: Negative for dysuria. Musculoskeletal: Negative for back pain. Skin: Negative for rash. Neurological: Negative for headaches, positive for dizziness and left arm weakness.  Negative for numbness.  ____________________________________________   PHYSICAL EXAM:  VITAL SIGNS: ED Triage Vitals  Enc Vitals Group     BP 04/30/21 0701 (!) 162/77     Pulse Rate 04/30/21 0701 66     Resp 04/30/21 0701 16     Temp 04/30/21 0701 (!) 97.5 F (36.4 C)     Temp Source  04/30/21 0701 Oral     SpO2 04/30/21 0701 98 %     Weight 04/30/21 0702 195 lb (88.5 kg)     Height 04/30/21 0702 5\' 9"  (1.753 m)     Head Circumference --      Peak Flow --      Pain Score 04/30/21 0701 2     Pain Loc --      Pain Edu? --      Excl. in [place]? --     Constitutional: Alert and oriented. Eyes: Conjunctivae are normal.  Pupils equal, round, and reactive to light bilaterally.  Extraocular movements intact. Head: Atraumatic. Nose: No congestion/rhinnorhea. Mouth/Throat: Mucous membranes are moist. Neck: Normal ROM Cardiovascular: Normal rate, regular rhythm.  Grossly normal heart sounds.  2+ radial pulses bilaterally. Respiratory: Normal respiratory effort.  No retractions. Lungs CTAB. Gastrointestinal: Soft and nontender. No distention. Genitourinary: deferred Musculoskeletal: No lower extremity tenderness nor edema. Neurologic:  Normal speech and language. No gross focal neurologic deficits are appreciated. Skin:  Skin is warm, dry and intact. No rash noted. Psychiatric: Mood and affect are normal. Speech and behavior are normal.  ____________________________________________   LABS (all labs ordered are listed, but only abnormal results are displayed)  Labs Reviewed  COMPREHENSIVE METABOLIC PANEL - Abnormal; Notable for the following components:      Result Value   Glucose, Bld 147 (*)    All other components within normal limits  PROTIME-INR  APTT  CBC  DIFFERENTIAL  CBG MONITORING, ED   ____________________________________________   PROCEDURES  Procedure(s) performed (including Critical Care):  Procedures   ____________________________________________   INITIAL IMPRESSION / ASSESSMENT AND PLAN / ED COURSE      60 year old male with past medical history of ocular migraines presents to the ED complaining of sudden onset right eye and ear discomfort associated with blurry vision and some difficulty using his left arm.  The symptoms have now resolved and patient only complains of dizziness and nausea, he has no focal neurologic deficits on exam and visual acuity is now intact.  CT head is negative for acute process and labs are unremarkable.  Findings reviewed with Dr. 67 of neurology, who agrees that suspicion for TIA is very low given brief symptoms, associated pain, and no clear neurologic distribution.  We will treat with migraine cocktail and reassess.  Patient with improving symptoms following migraine cocktail but does state he continues to feel dizzy with some blurry vision in his right eye.  MRI was performed and  negative for acute process, no evidence of stroke to explain his symptoms.  At this point, symptoms seem most likely to be part of a complicated migraine and he is appropriate for discharge home with PCP follow-up.  He also has establish care with an ophthalmologist and I counseled him to follow-up with them as well.  He was counseled to return to the ED for new or worsening symptoms, patient agrees with plan.      ____________________________________________   FINAL CLINICAL IMPRESSION(S) / ED DIAGNOSES  Final diagnoses:  Dizziness  Blurry vision     ED Discharge Orders     None        Note:  This document was prepared using Dragon voice recognition software and may include unintentional dictation errors.    Selina Cooley, MD 04/30/21 1425

## 2021-07-09 ENCOUNTER — Observation Stay
Admission: EM | Admit: 2021-07-09 | Discharge: 2021-07-10 | Disposition: A | Discharge status: Home / Self Care | Payer: BC Managed Care – PPO | Attending: Internal Medicine | Admitting: Internal Medicine

## 2021-07-09 ENCOUNTER — Emergency Department: Payer: BC Managed Care – PPO

## 2021-07-09 ENCOUNTER — Other Ambulatory Visit: Payer: Self-pay

## 2021-07-09 ENCOUNTER — Encounter: Payer: Self-pay | Admitting: Emergency Medicine

## 2021-07-09 DIAGNOSIS — Z20822 Contact with and (suspected) exposure to covid-19: Secondary | ICD-10-CM | POA: Insufficient documentation

## 2021-07-09 DIAGNOSIS — I639 Cerebral infarction, unspecified: Secondary | ICD-10-CM | POA: Diagnosis present

## 2021-07-09 DIAGNOSIS — I10 Essential (primary) hypertension: Secondary | ICD-10-CM | POA: Insufficient documentation

## 2021-07-09 DIAGNOSIS — Z8616 Personal history of COVID-19: Secondary | ICD-10-CM | POA: Diagnosis not present

## 2021-07-09 DIAGNOSIS — R519 Headache, unspecified: Secondary | ICD-10-CM | POA: Diagnosis present

## 2021-07-09 DIAGNOSIS — Z79899 Other long term (current) drug therapy: Secondary | ICD-10-CM | POA: Diagnosis not present

## 2021-07-09 DIAGNOSIS — E871 Hypo-osmolality and hyponatremia: Secondary | ICD-10-CM | POA: Diagnosis not present

## 2021-07-09 DIAGNOSIS — I6322 Cerebral infarction due to unspecified occlusion or stenosis of basilar arteries: Secondary | ICD-10-CM | POA: Diagnosis not present

## 2021-07-09 DIAGNOSIS — R001 Bradycardia, unspecified: Secondary | ICD-10-CM | POA: Diagnosis not present

## 2021-07-09 IMAGING — MR MR HEAD W/O CM
12 series · 35 of 48 positions shown · IV contrast (gadavist)
Comparison: Prior CT from earlier the same day.

CLINICAL DATA: Initial evaluation for neuro deficit, stroke
suspected.

EXAM:
MRI HEAD WITHOUT CONTRAST
MRA HEAD WITHOUT CONTRAST
MRA NECK WITHOUT AND WITH CONTRAST
TECHNIQUE: Multiplanar, multi-echo pulse sequences of the brain and surrounding
structures were acquired without intravenous contrast. Angiographic
images of the Circle of Willis were acquired using MRA technique
without intravenous contrast. Angiographic images of the neck were
acquired using MRA technique without and with intravenous contrast.
Carotid stenosis measurements (when applicable) are obtained
utilizing NASCET criteria, using the distal internal carotid
diameter as the denominator.
CONTRAST:  9mL GADAVIST GADOBUTROL 1 MMOL/ML IV SOLN

[Series 5: ax dwi_tracew · axial · 3.0mm · 0.65mm/px · z∈[-90,+61]mm · 4 of 48 slices shown]
[im 1/48]
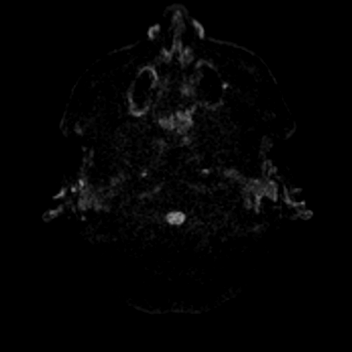
[im 16/48]
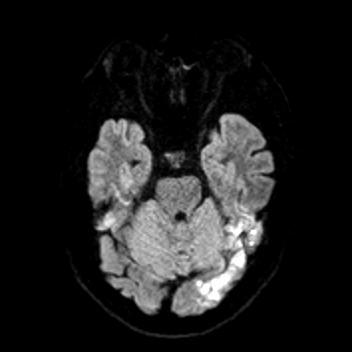
[im 32/48]
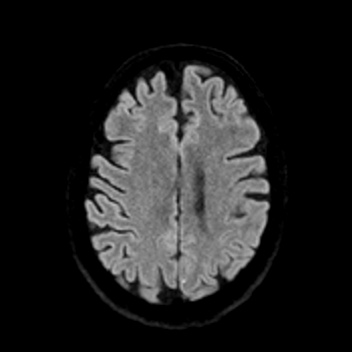
[im 48/48]
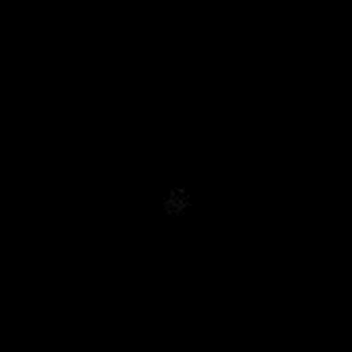

[Series 6: ax dwi_adc · axial · 3.0mm · 0.65mm/px · z∈[-90,+58]mm · 3 of 47 slices shown]
[im 1/47]
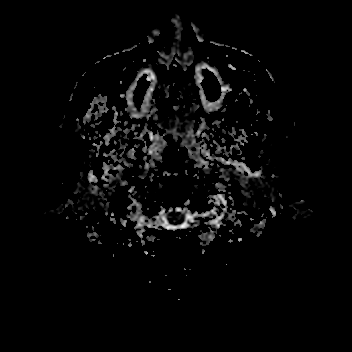
[im 24/47]
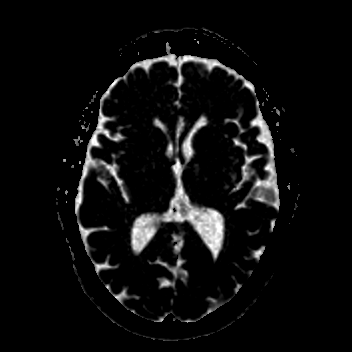
[im 47/47]
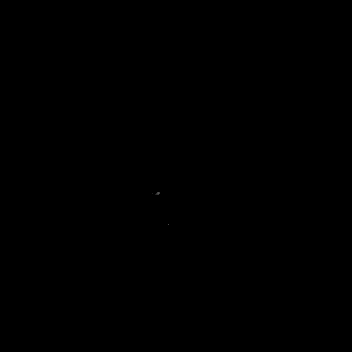

[Series 7: cor dwi_tracew · coronal · 5.0mm · 0.65mm/px · 2 of 40 slices shown]
[im 1/40]
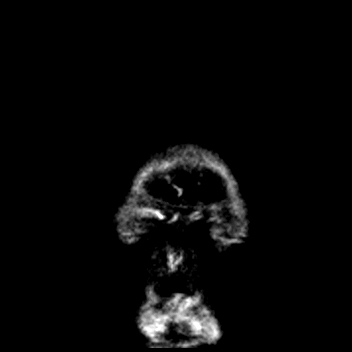
[im 40/40]
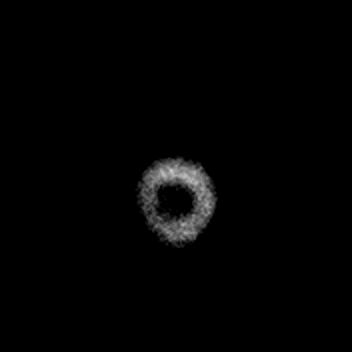

[Series 8: cor dwi_adc · coronal · 5.0mm · 0.65mm/px · 2 of 40 slices shown]
[im 1/40]
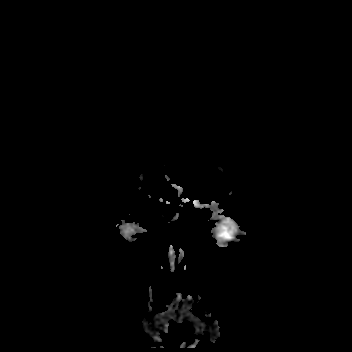
[im 40/40]
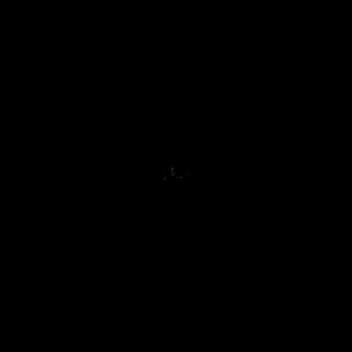

[Series 9: T1 · sagittal · 5.0mm · 0.62mm/px · 1 of 23 slices shown (1 of 2)]
[im 1/23]
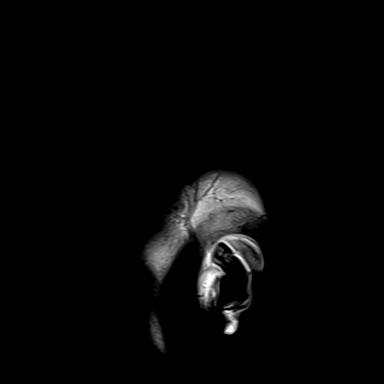

[Series 10: T2 · axial · 5.0mm · 0.53mm/px · z∈[-88,+65]mm · 2 of 27 slices shown (1 of 2)]
[im 1/27]
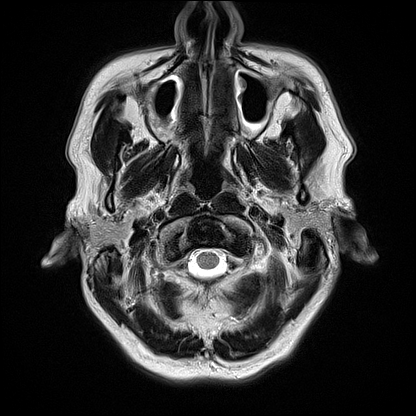
[im 27/27]
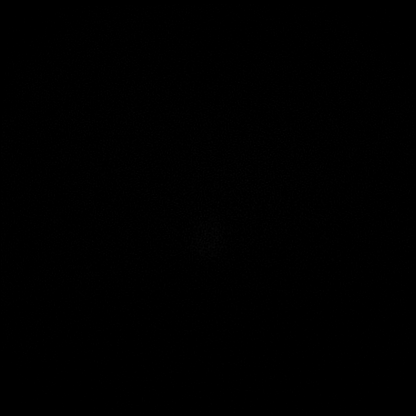

[Series 12: pha_images · axial · 3.0mm · 0.90mm/px · z∈[-98,+66]mm · 3 of 57 slices shown]
[im 1/57]
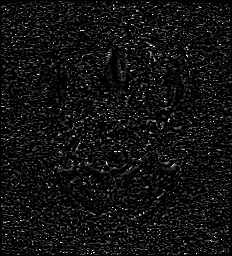
[im 29/57]
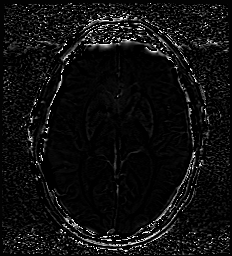
[im 57/57]
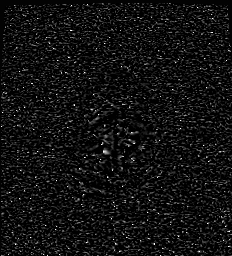

[Series 13: swi_images · axial · 3.0mm · 0.90mm/px · z∈[-98,+75]mm · 4 of 60 slices shown]
[im 1/60]
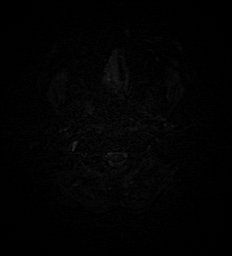
[im 20/60]
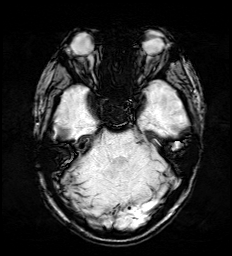
[im 40/60]
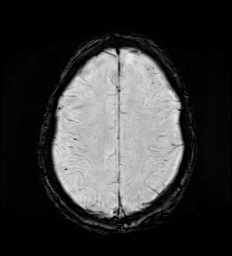
[im 60/60]
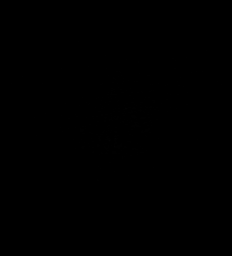

[Series 15: FLAIR · axial · 3.0mm · 0.53mm/px · z∈[-91,+68]mm · 3 of 55 slices shown]
[im 1/55]
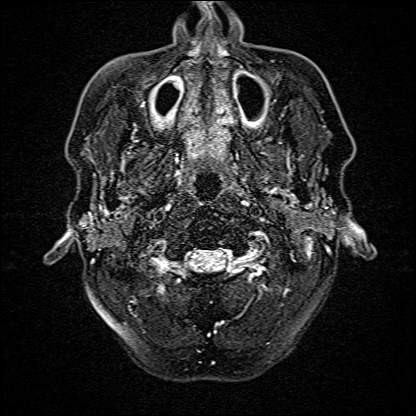
[im 28/55]
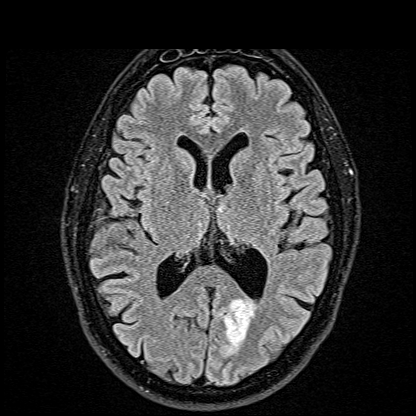
[im 55/55]
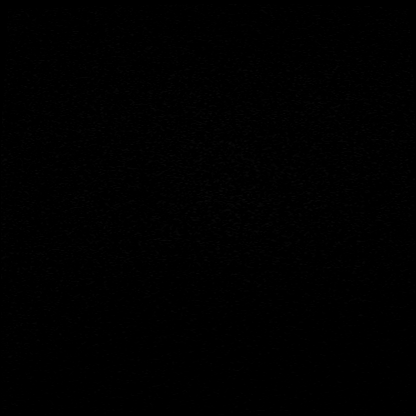

[Series 16: T1 · axial · 1.0mm · 0.98mm/px · z∈[-99,+72]mm · 8 of 174 slices shown (2 of 2)]
[im 1/174]
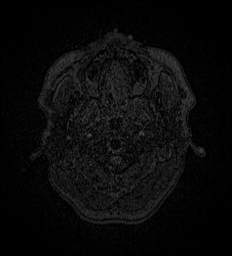
[im 20/174]
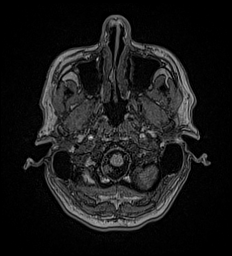
[im 58/174]
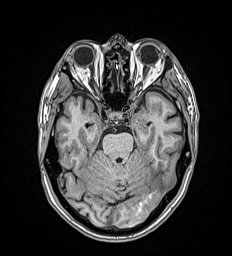
[im 77/174]
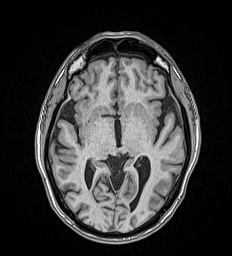
[im 97/174]
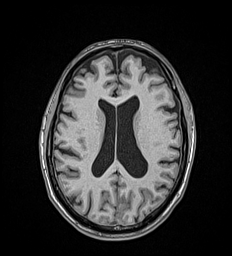
[im 116/174]
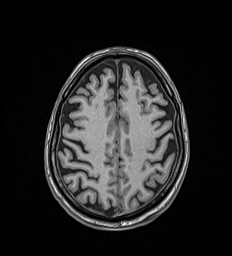
[im 154/174]
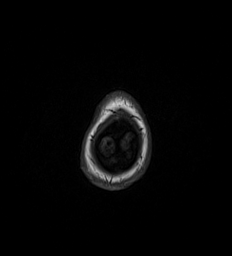
[im 174/174]
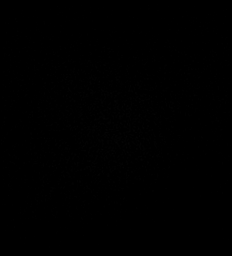

[Series 17: T2 · coronal · 5.0mm · 0.57mm/px · 2 of 31 slices shown (2 of 2)]
[im 1/31]
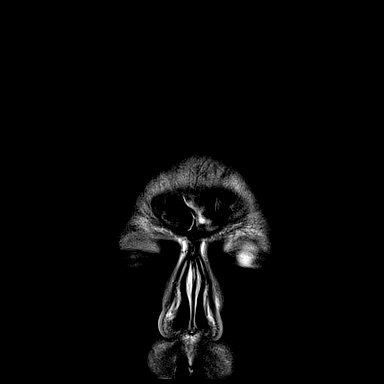
[im 31/31]
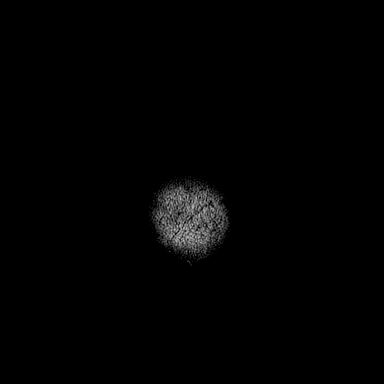

[Series 18: TOF · axial · 0.5mm · 0.41mm/px · 1 of 205 slices shown]
[im 1/205]
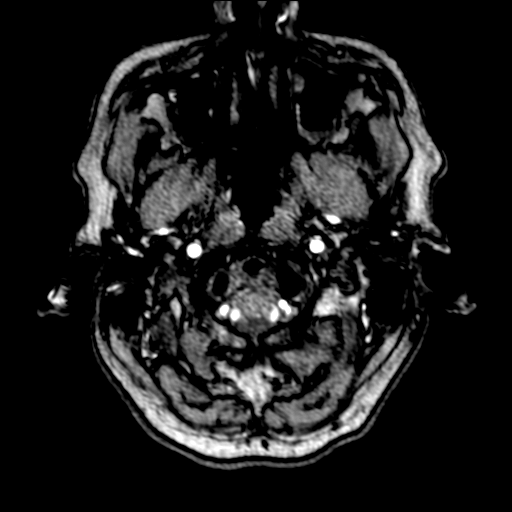

[35 of 48 positions shown; findings below may reference images not displayed]

FINDINGS: MRI HEAD FINDINGS

Brain: Cerebral volume within normal limits. No significant cerebral
white matter disease for age.

Restricted diffusion involving the left parieto-occipital region,
consistent with acute to early subacute left PCA distribution
infarct. Associated gyral swelling and edema without significant
regional mass effect. Scattered areas of intrinsic T1 hyperintensity
and susceptibility artifact within the area of infarction consistent
with petechial hemorrhage. No frank hemorrhagic transformation or
intraparenchymal hematoma formation.

No other evidence for acute or subacute ischemia. No other areas of
chronic cortical infarction. No other acute or chronic blood
products.

No mass lesion or midline shift. No hydrocephalus or extra-axial
fluid collection. Pituitary gland and suprasellar region normal.

Vascular: Focal heterogeneous signal abnormality seen within the mid
basilar artery, corresponding with a high-grade stenosis seen on
corresponding MRA (series 10, image 8). Major intracranial vascular
flow voids are otherwise maintained.

Skull and upper cervical spine: Craniocervical junction within
normal limits. Bone marrow signal intensity normal. No scalp soft
tissue abnormality.

Sinuses/Orbits: Patient status post bilateral ocular lens
replacement. Prior scleral buckle on the left. Globes orbital soft
tissues demonstrate no acute finding. Scattered mucosal thickening
noted throughout the paranasal sinuses. No mastoid effusion. Inner
ear structures grossly normal.

Other: None.

MRA HEAD FINDINGS

Anterior circulation: Visualized distal cervical segments of the
internal carotid arteries are patent with antegrade flow. Petrous,
cavernous, and supraclinoid segments patent without stenosis or
other abnormality. A1 segments patent bilaterally. Normal anterior
communicating artery complex. Evaluation of the anterior cerebral
artery somewhat limited by motion, but are grossly patent to their
distal aspects without appreciable stenosis. No visible M1 stenosis
or occlusion. Normal MCA bifurcations. Distal MCA branches perfused
and symmetric.

Posterior circulation: Both V4 segments patent to the
vertebrobasilar junction without stenosis. Neither PICA origin well
visualized. Basilar widely patent proximally. There is a focal flow
gap within the mid basilar artery, consistent with a severe
high-grade stenosis (series [GH], image 12). Stenosis measures
approximately 3 mm in length. Basilar otherwise patent distally. No
made of a possible short-segment fenestration at the distal basilar
artery just prior to the takeoff of the SCA is. The SCA is
themselves are patent. Both PCAs supplied via the basilar as well as
small bilateral posterior communicating arteries. Right PCA grossly
patent to its distal aspect. Left PCA patent through its P2 segment,
but is attenuated distally, in keeping with the acute left PCA
distribution infarct.

Anatomic variants: None significant.  No aneurysm.

MRA NECK FINDINGS

Aortic arch: Visualized aortic arch normal in caliber with normal
branch pattern. No stenosis about the origin of the great vessels.

Right carotid system: Right common and internal carotid arteries
patent without stenosis or evidence for dissection. No significant
atheromatous narrowing about the right carotid bulb.

Left carotid system: Left common and internal carotid arteries
patent without stenosis or evidence for dissection. No significant
atheromatous narrowing about the left carotid bulb.

Vertebral arteries: Both vertebral arteries arise from the
subclavian arteries. No proximal subclavian artery stenosis.
Vertebral arteries patent within the neck without stenosis or
evidence for dissection.

Other: None
IMPRESSION: MRI HEAD:

1. Acute to early subacute left PCA distribution infarct. Associated
petechial hemorrhage without frank hemorrhagic transformation or
significant regional mass effect.
2. Otherwise normal brain MRI for age.

MRA HEAD:

1. Focal severe near occlusive/high-grade stenosis involving the mid
basilar artery as above. Neuro interventional consultation
suggested, as this patient may be a candidate for vascular stenting.
2. Otherwise negative intracranial MRA. No large vessel occlusion.
No other hemodynamically significant or correctable stenosis.

MRA NECK:

Normal MRA of the neck.

## 2021-07-09 IMAGING — MR MR MRA NECK WO/W CM
1 of 2 series · 20 of 48 positions shown · IV contrast (9ml Gadavist)
Comparison: Prior CT from earlier the same day.

CLINICAL DATA: Initial evaluation for neuro deficit, stroke
suspected.

EXAM:
MRI HEAD WITHOUT CONTRAST
MRA HEAD WITHOUT CONTRAST
MRA NECK WITHOUT AND WITH CONTRAST
TECHNIQUE: Multiplanar, multi-echo pulse sequences of the brain and surrounding
structures were acquired without intravenous contrast. Angiographic
images of the Circle of Willis were acquired using MRA technique
without intravenous contrast. Angiographic images of the neck were
acquired using MRA technique without and with intravenous contrast.
Carotid stenosis measurements (when applicable) are obtained
utilizing NASCET criteria, using the distal internal carotid
diameter as the denominator.
CONTRAST:  9mL GADAVIST GADOBUTROL 1 MMOL/ML IV SOLN

[Series 11: angio_fl3d_cor_post_ttc=2.0s · coronal · B · 0.9mm · 0.85mm/px · 20 of 94 slices shown]
[im 1/94]
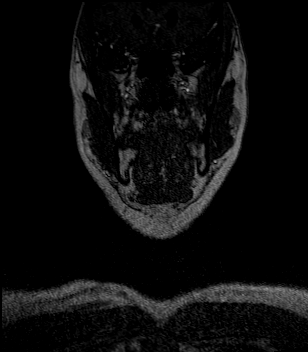
[im 5/94]
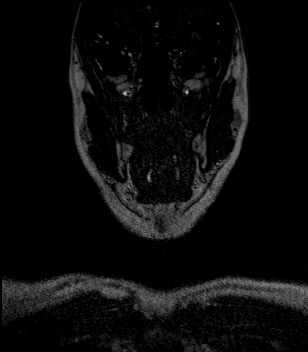
[im 10/94]
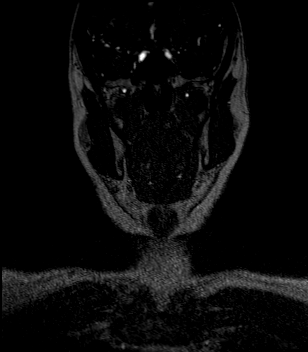
[im 15/94]
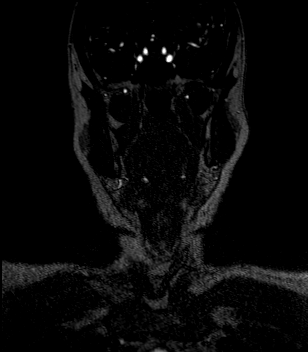
[im 20/94]
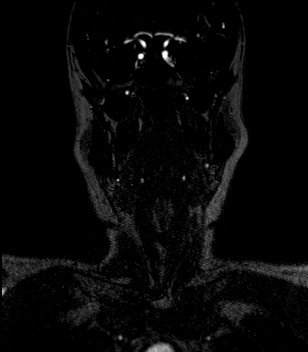
[im 25/94]
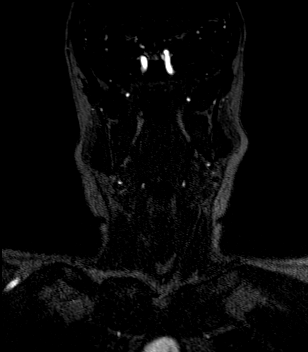
[im 30/94]
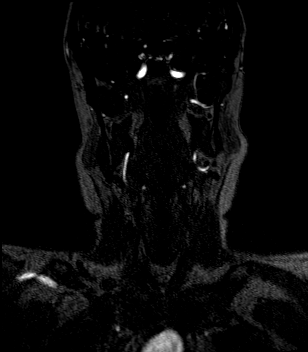
[im 35/94]
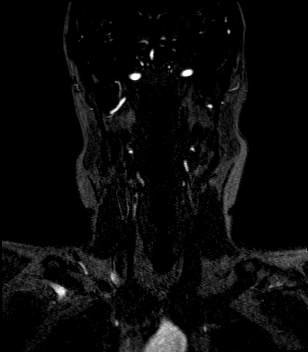
[im 40/94]
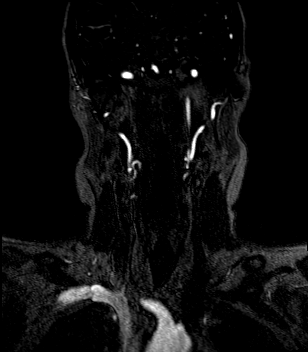
[im 45/94]
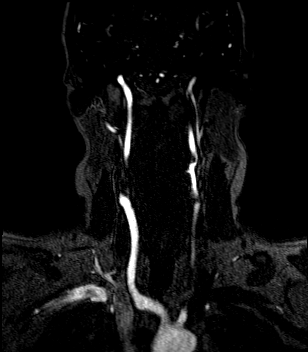
[im 49/94]
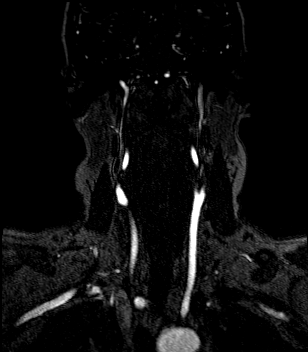
[im 54/94]
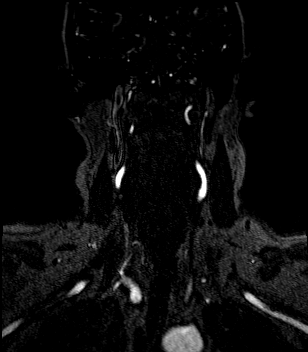
[im 59/94]
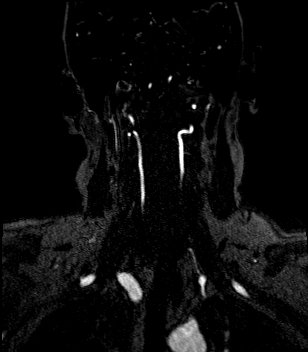
[im 64/94]
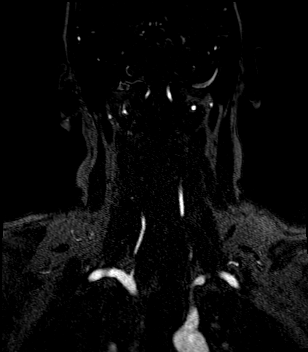
[im 69/94]
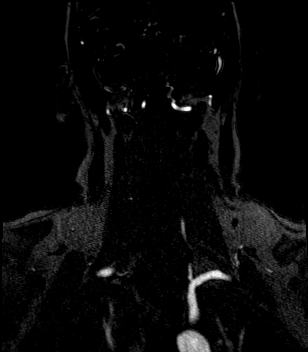
[im 74/94]
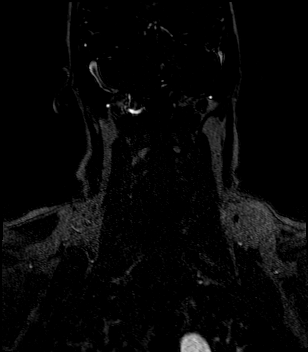
[im 79/94]
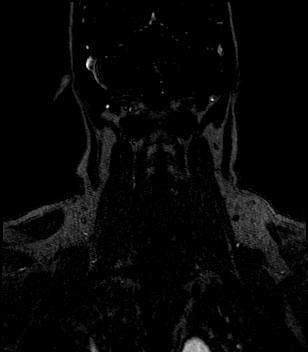
[im 84/94]
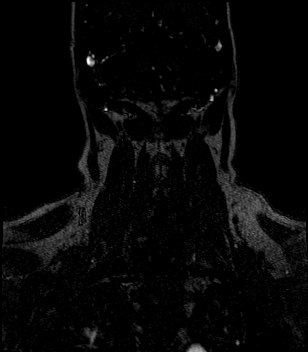
[im 89/94]
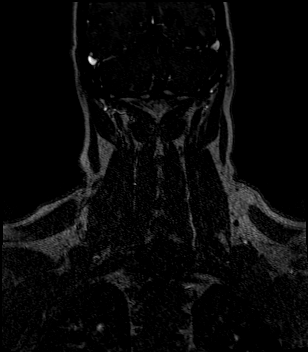
[im 94/94]
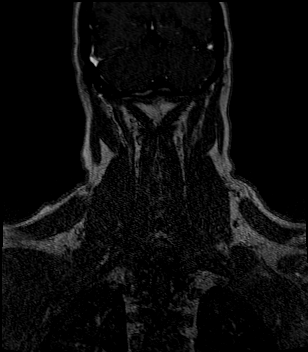

[20 of 48 positions shown; findings below may reference images not displayed]

FINDINGS: MRI HEAD FINDINGS

Brain: Cerebral volume within normal limits. No significant cerebral
white matter disease for age.

Restricted diffusion involving the left parieto-occipital region,
consistent with acute to early subacute left PCA distribution
infarct. Associated gyral swelling and edema without significant
regional mass effect. Scattered areas of intrinsic T1 hyperintensity
and susceptibility artifact within the area of infarction consistent
with petechial hemorrhage. No frank hemorrhagic transformation or
intraparenchymal hematoma formation.

No other evidence for acute or subacute ischemia. No other areas of
chronic cortical infarction. No other acute or chronic blood
products.

No mass lesion or midline shift. No hydrocephalus or extra-axial
fluid collection. Pituitary gland and suprasellar region normal.

Vascular: Focal heterogeneous signal abnormality seen within the mid
basilar artery, corresponding with a high-grade stenosis seen on
corresponding MRA (series 10, image 8). Major intracranial vascular
flow voids are otherwise maintained.

Skull and upper cervical spine: Craniocervical junction within
normal limits. Bone marrow signal intensity normal. No scalp soft
tissue abnormality.

Sinuses/Orbits: Patient status post bilateral ocular lens
replacement. Prior scleral buckle on the left. Globes orbital soft
tissues demonstrate no acute finding. Scattered mucosal thickening
noted throughout the paranasal sinuses. No mastoid effusion. Inner
ear structures grossly normal.

Other: None.

MRA HEAD FINDINGS

Anterior circulation: Visualized distal cervical segments of the
internal carotid arteries are patent with antegrade flow. Petrous,
cavernous, and supraclinoid segments patent without stenosis or
other abnormality. A1 segments patent bilaterally. Normal anterior
communicating artery complex. Evaluation of the anterior cerebral
artery somewhat limited by motion, but are grossly patent to their
distal aspects without appreciable stenosis. No visible M1 stenosis
or occlusion. Normal MCA bifurcations. Distal MCA branches perfused
and symmetric.

Posterior circulation: Both V4 segments patent to the
vertebrobasilar junction without stenosis. Neither PICA origin well
visualized. Basilar widely patent proximally. There is a focal flow
gap within the mid basilar artery, consistent with a severe
high-grade stenosis (series [GH], image 12). Stenosis measures
approximately 3 mm in length. Basilar otherwise patent distally. No
made of a possible short-segment fenestration at the distal basilar
artery just prior to the takeoff of the SCA is. The SCA is
themselves are patent. Both PCAs supplied via the basilar as well as
small bilateral posterior communicating arteries. Right PCA grossly
patent to its distal aspect. Left PCA patent through its P2 segment,
but is attenuated distally, in keeping with the acute left PCA
distribution infarct.

Anatomic variants: None significant.  No aneurysm.

MRA NECK FINDINGS

Aortic arch: Visualized aortic arch normal in caliber with normal
branch pattern. No stenosis about the origin of the great vessels.

Right carotid system: Right common and internal carotid arteries
patent without stenosis or evidence for dissection. No significant
atheromatous narrowing about the right carotid bulb.

Left carotid system: Left common and internal carotid arteries
patent without stenosis or evidence for dissection. No significant
atheromatous narrowing about the left carotid bulb.

Vertebral arteries: Both vertebral arteries arise from the
subclavian arteries. No proximal subclavian artery stenosis.
Vertebral arteries patent within the neck without stenosis or
evidence for dissection.

Other: None
IMPRESSION: MRI HEAD:

1. Acute to early subacute left PCA distribution infarct. Associated
petechial hemorrhage without frank hemorrhagic transformation or
significant regional mass effect.
2. Otherwise normal brain MRI for age.

MRA HEAD:

1. Focal severe near occlusive/high-grade stenosis involving the mid
basilar artery as above. Neuro interventional consultation
suggested, as this patient may be a candidate for vascular stenting.
2. Otherwise negative intracranial MRA. No large vessel occlusion.
No other hemodynamically significant or correctable stenosis.

MRA NECK:

Normal MRA of the neck.

## 2021-07-09 IMAGING — CT CT HEAD W/O CM
4 series · 16 of 47 positions shown, 18 images · non-contrast
Comparison: MRI head  [DATE]

CLINICAL DATA: Headache. Loss of vision right eye. Symptoms started
a week ago.



[Series 2: head wo · axial · 0.44mm/px · z∈[-114,-4]mm · 7 of 30 slices shown, 9 images]
[im 4/30  brain]
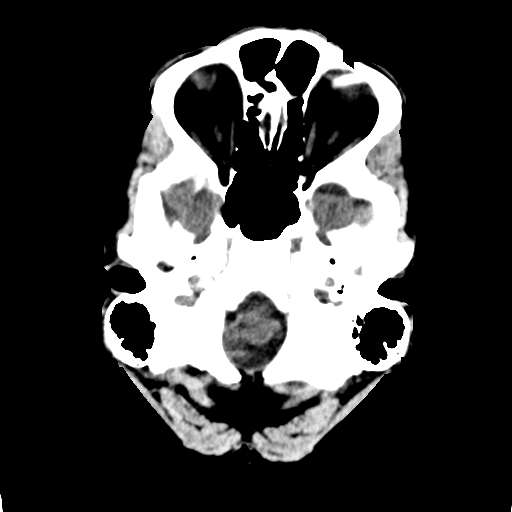
[im 4/30  bone]
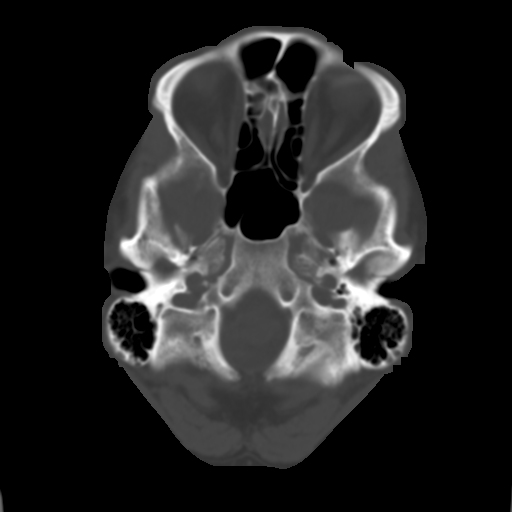
[im 8/30  brain]
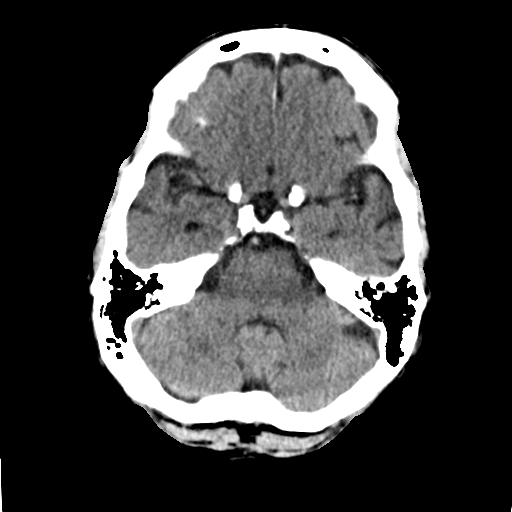
[im 11/30  brain]
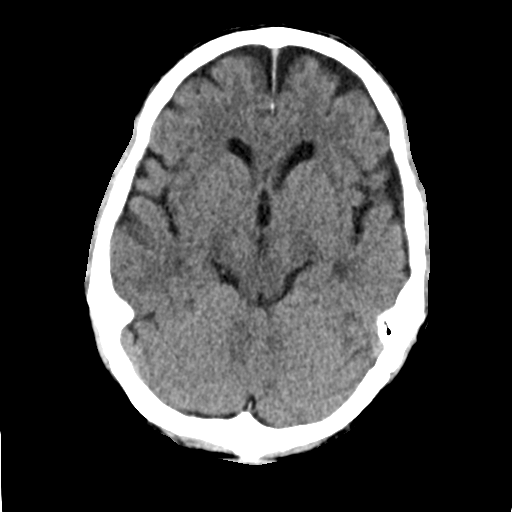
[im 15/30  brain]
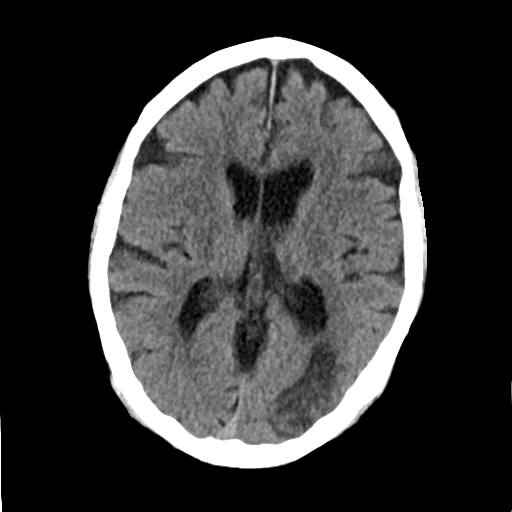
[im 19/30  brain]
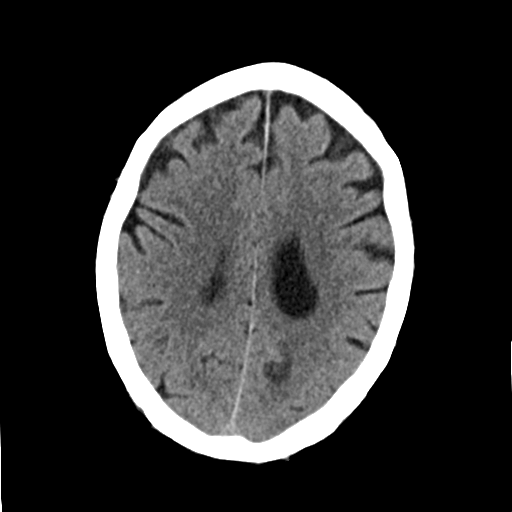
[im 19/30  bone]
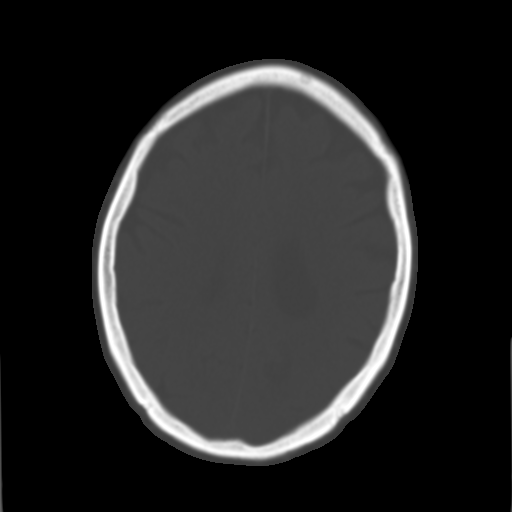
[im 22/30  brain]
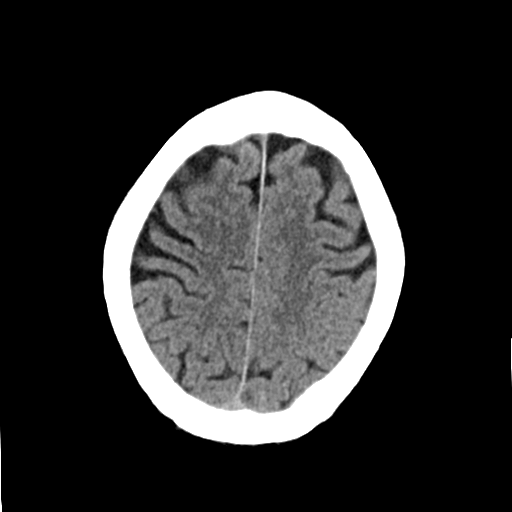
[im 26/30  brain]
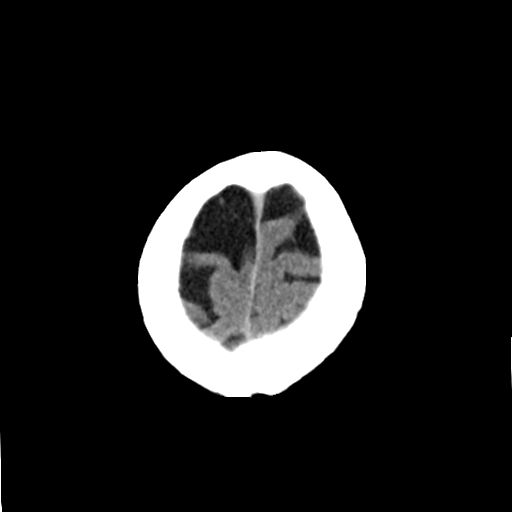

[Series 3: head bone · axial · 0.44mm/px · z∈[-115,-85]mm · 3 of 75 slices shown]
[im 8/75  bone]
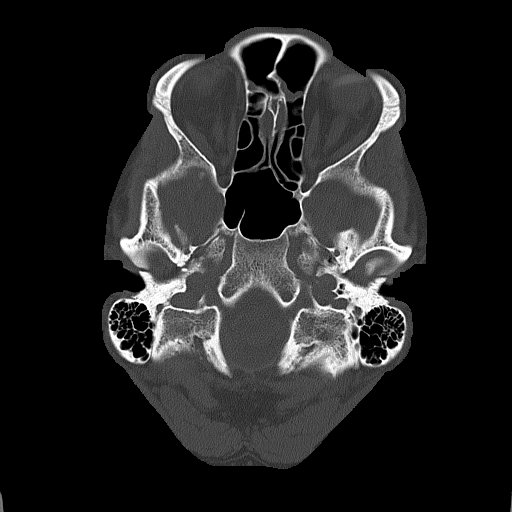
[im 15/75  bone]
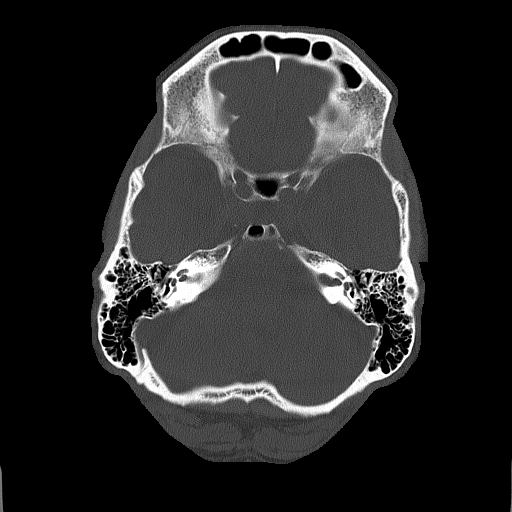
[im 23/75  bone]
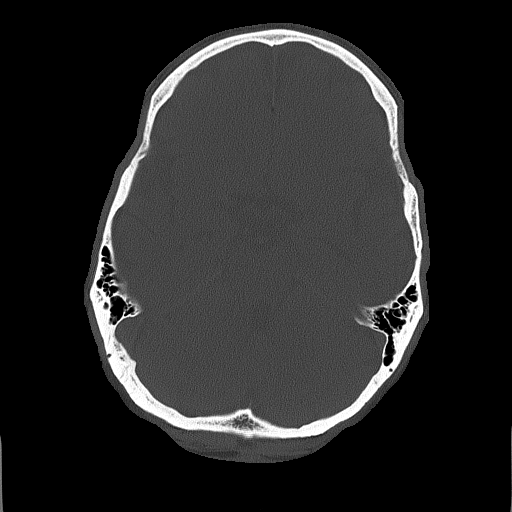

[Series 4: coronal soft tissue · coronal · 0.32mm/px · 3 of 69 slices shown]
[im 23/69  brain]
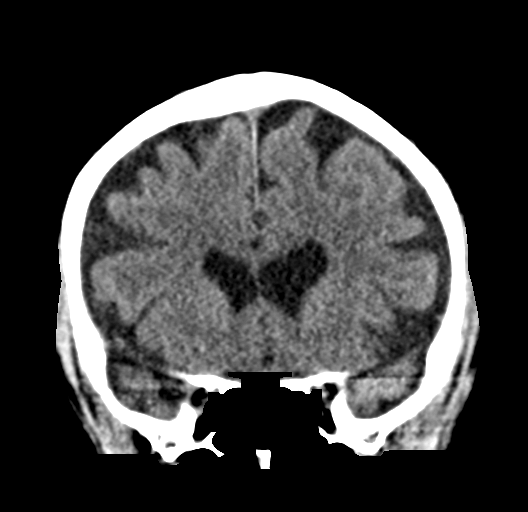
[im 31/69  brain]
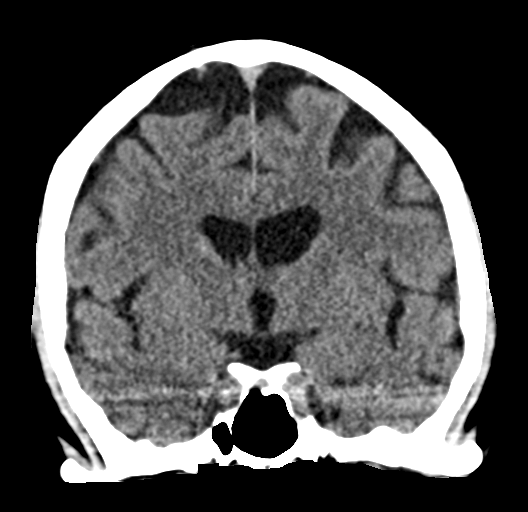
[im 38/69  brain]
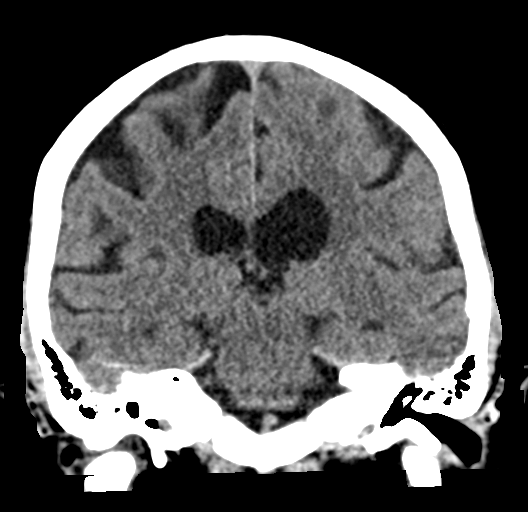

[Series 5: sagittal soft tissue · sagittal · 0.32mm/px · 3 of 56 slices shown]
[im 19/56  brain]
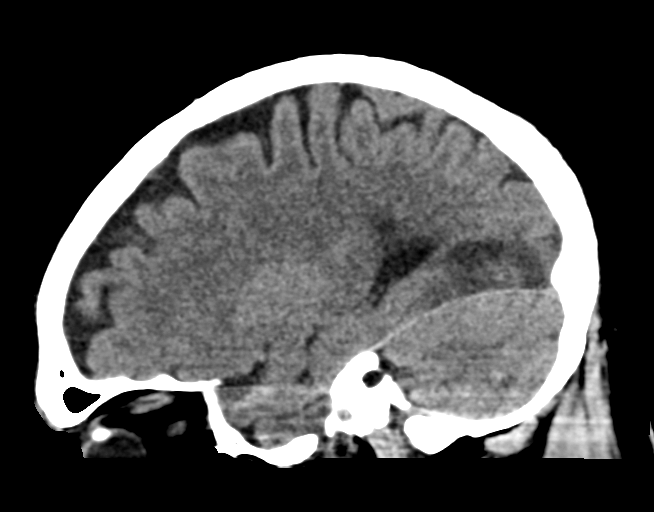
[im 28/56  brain]
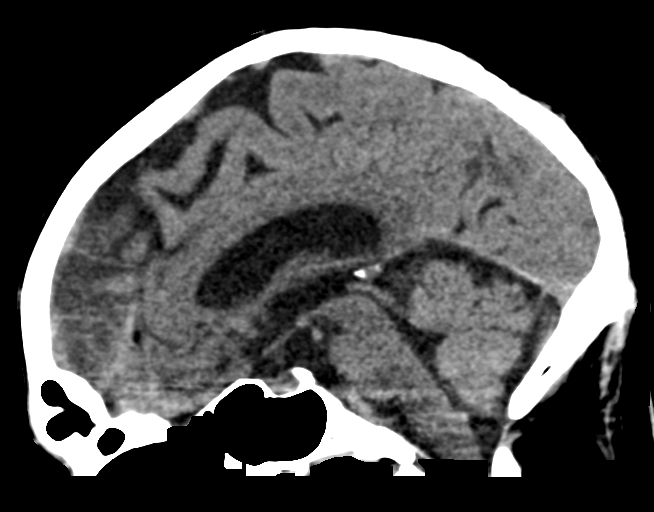
[im 37/56  brain]
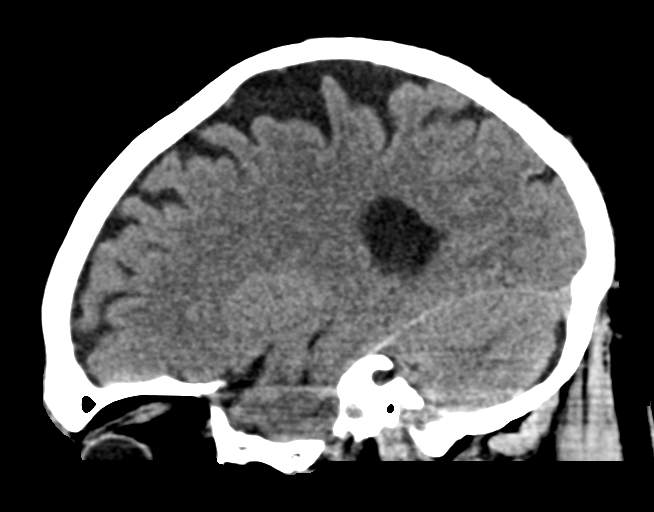

[16 of 47 positions shown; findings below may reference images not displayed]

FINDINGS: Brain: Interval development of ill-defined hypodensity left
occipital parietal lobe compatible with subacute infarct. No
associated acute hemorrhage.

Ventricle size normal.  Negative for mass or acute hemorrhage.

Vascular: Negative for hyperdense vessel

Skull: Negative

Sinuses/Orbits: Minimal mucosal edema paranasal sinuses. Bilateral
ocular surgery.

Other: None
IMPRESSION: Ill-defined hypodensity left occipital parietal lobe compatible with
subacute infarct. No acute hemorrhage.

## 2021-07-09 IMAGING — CT CT ORBITS W/O CM
3 of 6 series · 15 of 47 positions shown, 18 images · non-contrast
Comparison: CT head [DATE]

CLINICAL DATA: Vision loss right eye.  Symptoms started a week ago.

EXAM:
CT ORBITS WITHOUT CONTRAST
TECHNIQUE: Multidetector CT imaging of the orbits was performed using the
standard protocol without intravenous contrast. Multiplanar CT image
reconstructions were also generated.
RADIATION DOSE REDUCTION: This exam was performed according to the
departmental dose-optimization program which includes automated
exposure control, adjustment of the mA and/or kV according to
patient size and/or use of iterative reconstruction technique.

[Series 2: orbits 2.0 hr60 bone · axial · 0.26mm/px · z∈[-163,-101]mm · 10 of 37 slices shown, 13 images]
[im 3/37  brain]
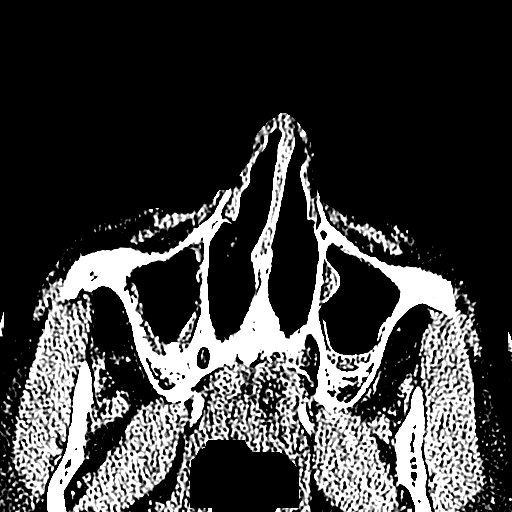
[im 3/37  bone]
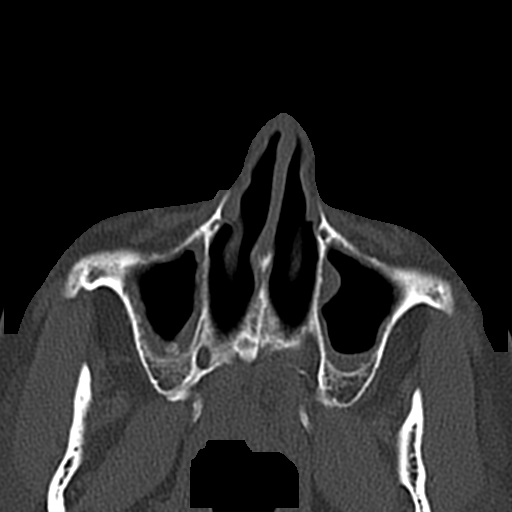
[im 6/37  bone]
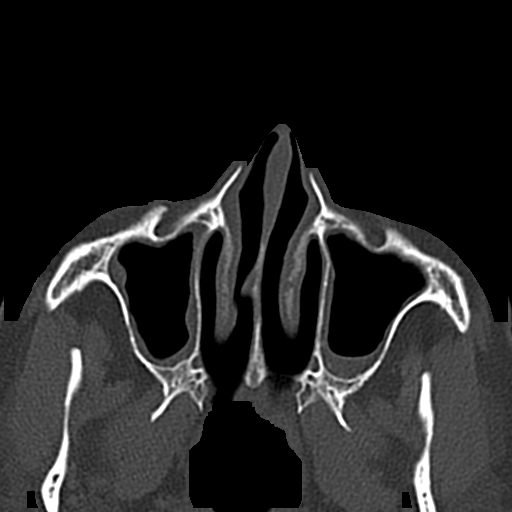
[im 11/37  bone]
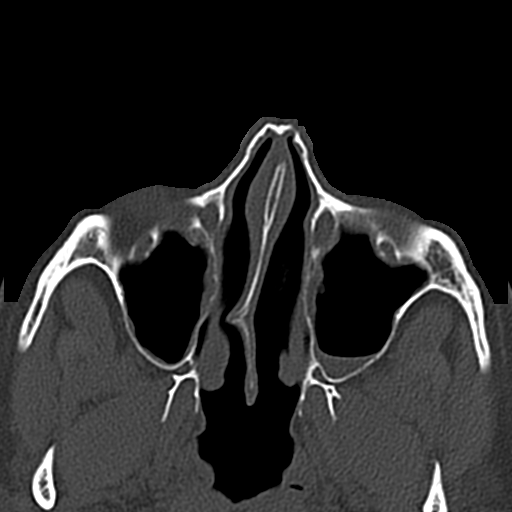
[im 13/37  bone]
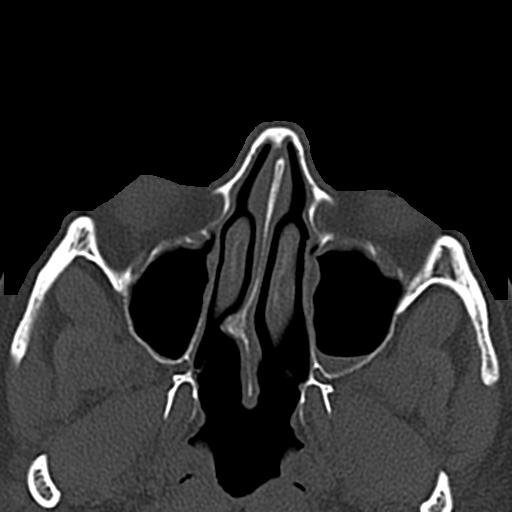
[im 16/37  brain]
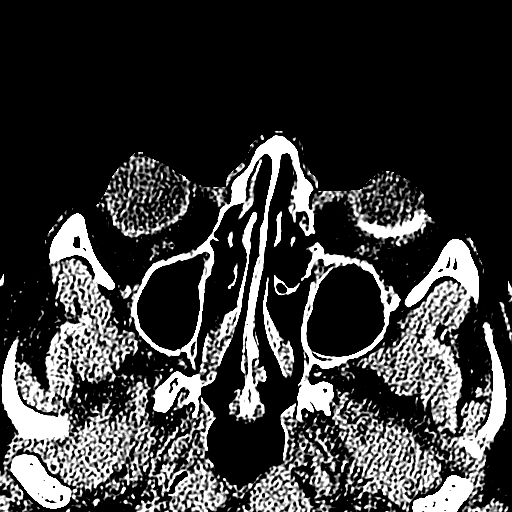
[im 16/37  bone]
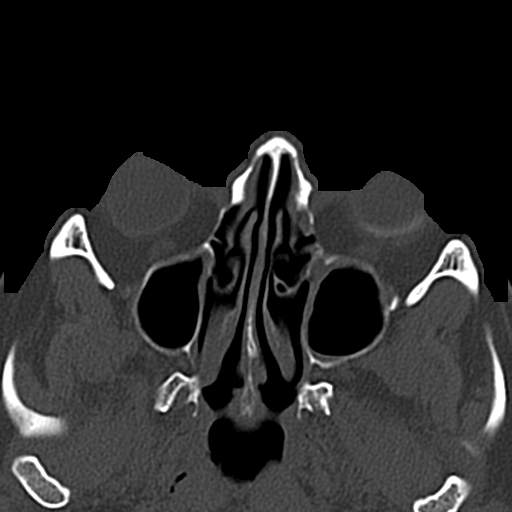
[im 21/37  bone]
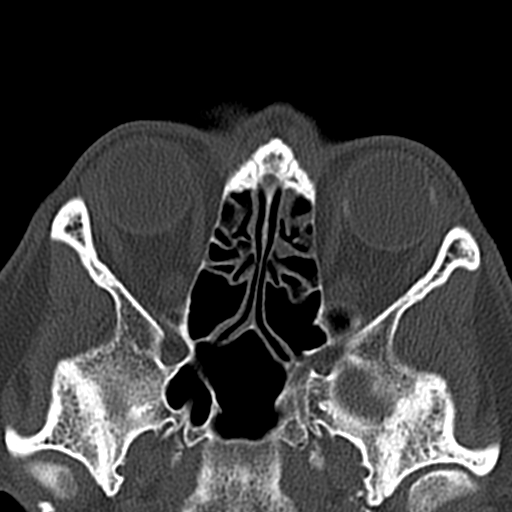
[im 24/37  bone]
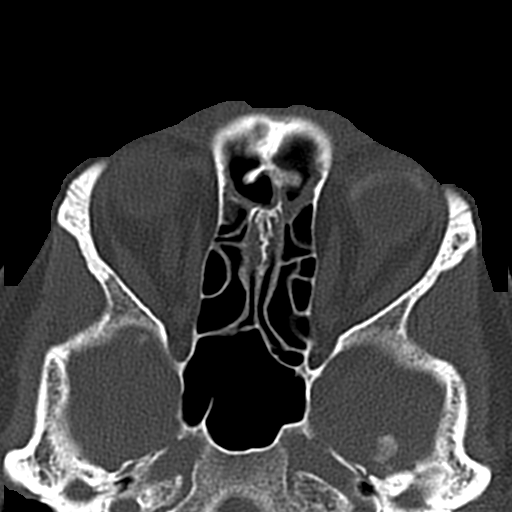
[im 26/37  bone]
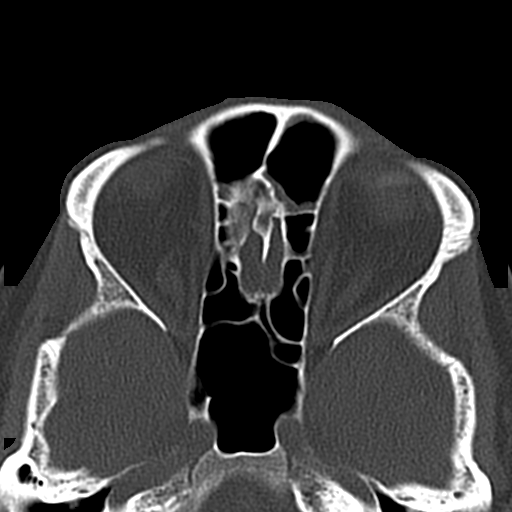
[im 31/37  brain]
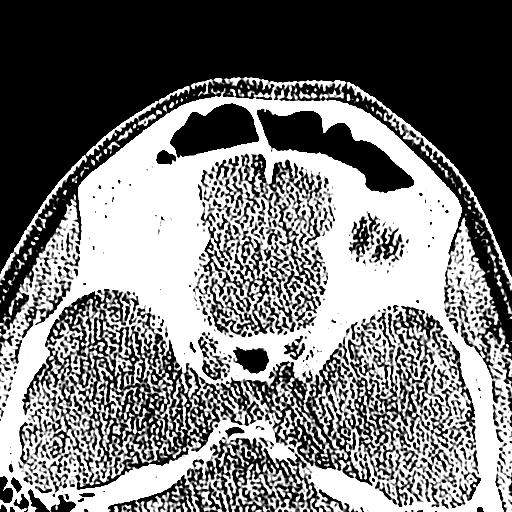
[im 31/37  bone]
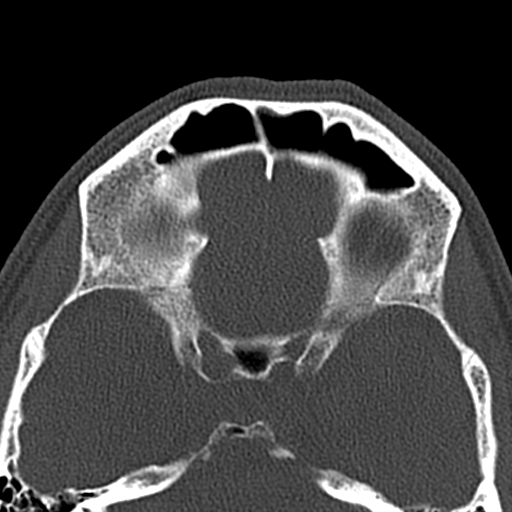
[im 34/37  bone]
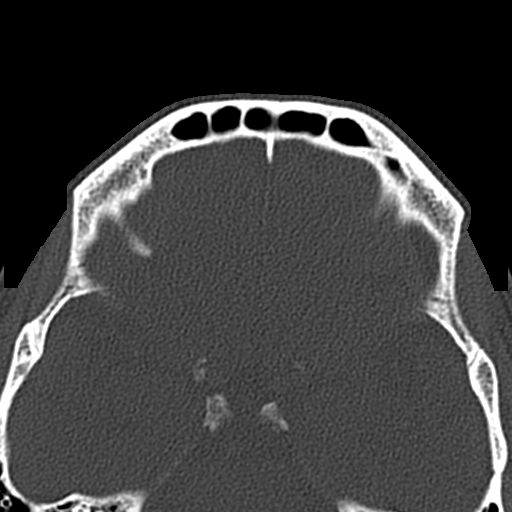

[Series 6: orbits 2.0 coronal · coronal · 0.23mm/px · 3 of 75 slices shown]
[im 15/75  bone]
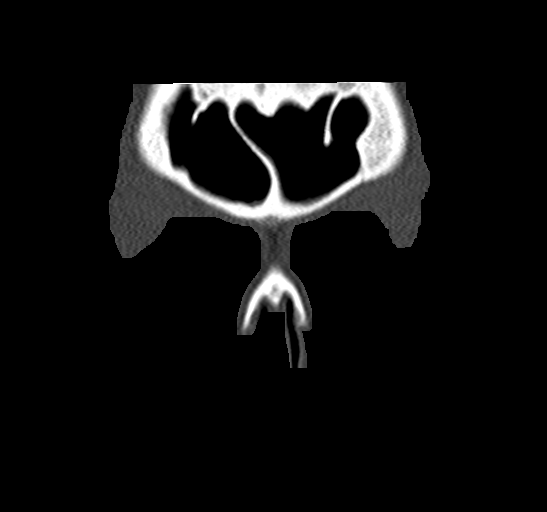
[im 30/75  bone]
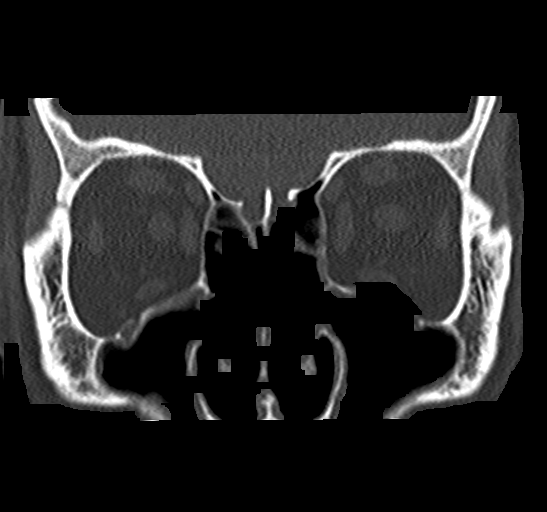
[im 45/75  bone]
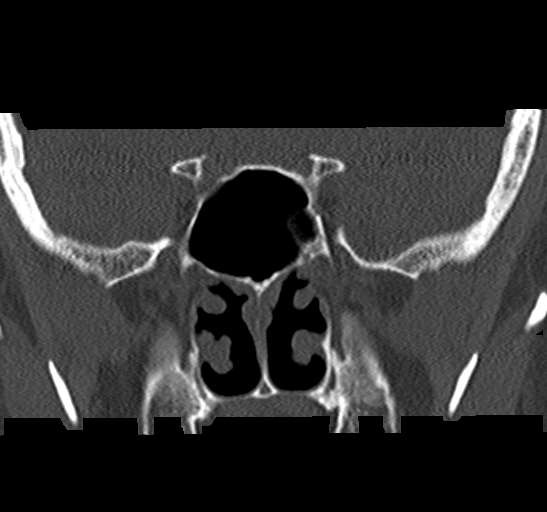

[Series 9: orbits 2.0 sagittal · sagittal · 0.19mm/px · 2 of 73 slices shown]
[im 25/73  bone]
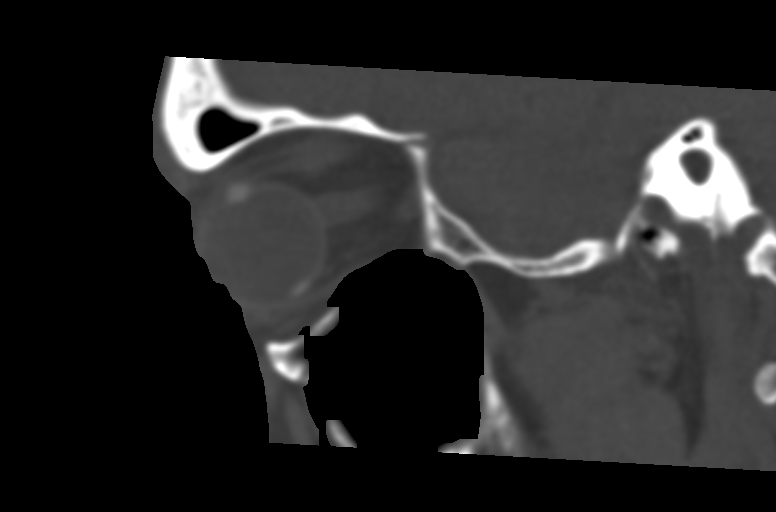
[im 49/73  bone]
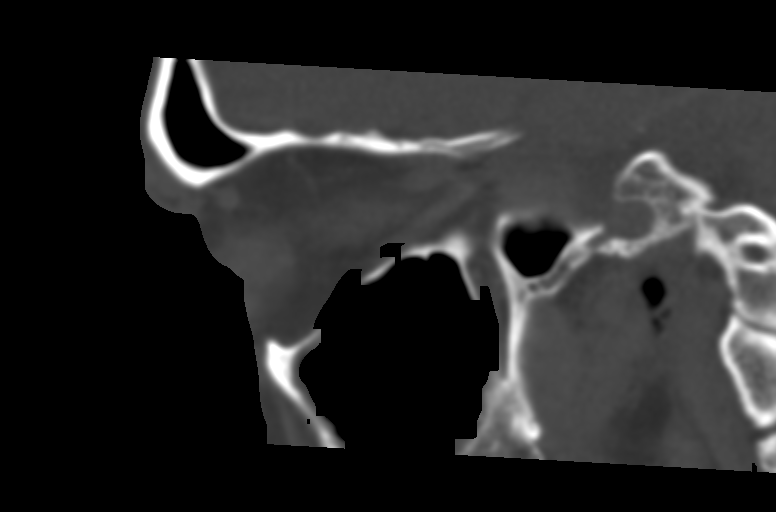

[15 of 47 positions shown; findings below may reference images not displayed]

FINDINGS: Orbits: No orbital mass or edema. Bilateral cataract extraction.
Scleral banding on the left. Pituitary not enlarged. Cavernous sinus
normal bilaterally.

Visible paranasal sinuses: Mucosal edema paranasal sinuses. Small
air-fluid level left maxillary sinus.

Soft tissues: Negative for soft tissue mass or edema.

Osseous: No fracture or skeletal lesion.
IMPRESSION: No orbital lesion.  Prior orbital surgery.

Sinus mucosal edema.  Air-fluid level left maxillary sinus.

## 2021-07-09 MED ORDER — GADOBUTROL 1 MMOL/ML IV SOLN
9.0000 mL | Freq: Once | INTRAVENOUS | Status: AC | PRN
  Administered 2021-07-09: 9 mL via INTRAVENOUS

## 2021-07-09 MED ORDER — ASPIRIN 81 MG PO CHEW
324.0000 mg | CHEWABLE_TABLET | Freq: Once | ORAL | Status: AC
Start: 2021-07-10 — End: 2021-07-10
  Administered 2021-07-10: 324 mg via ORAL
  Filled 2021-07-09: qty 4

## 2021-07-09 NOTE — ED Provider Notes (Signed)
[facility] Provider Note    Event Date/Time   First MD Initiated Contact with Patient 07/09/21 2113     (approximate)   History   Headache and Loss of Vision   HPI  Chris Shelton is a 61 y.o. male past medical history of cataracts who presents with difficulty with vision.  Patient noticed this sometime last week.  Having trouble seeing out of the right side of both eyes.  He saw his ophthalmologist today who referred him to the emergency department for concern for stroke.  He denies any other symptoms including difficulty with speech, numbness tingling weakness has no history of stroke    Past Medical History:  Diagnosis Date   Cataracts, bilateral 2019   Detached retina    Palpitations    Sinus trouble 2019    There are no problems to display for this patient.    Physical Exam  Triage Vital Signs: ED Triage Vitals  Enc Vitals Group     BP 07/09/21 1857 134/69     Pulse Rate 07/09/21 1857 60     Resp 07/09/21 1857 16     Temp 07/09/21 1857 98.2 F (36.8 C)     Temp Source 07/09/21 1857 Oral     SpO2 07/09/21 1857 98 %     Weight 07/09/21 1826 199 lb (90.3 kg)     Height 07/09/21 1826 5\' 9"  (1.753 m)     Head Circumference --      Peak Flow --      Pain Score 07/09/21 1825 8     Pain Loc --      Pain Edu? --      Excl. in GC? --     Most recent vital signs: Vitals:   07/09/21 1857 07/09/21 2323  BP: 134/69 (!) 155/83  Pulse: 60 (!) 56  Resp: 16 16  Temp: 98.2 F (36.8 C)   SpO2: 98% 99%     General: Awake, no distress.  CV:  Good peripheral perfusion.  Resp:  Normal effort.  Abd:  No distention.  Neuro:             Awake, Alert, Oriented x 3    Aox3, nml speech  PERRL, EOMI, face symmetric, nml tongue movement  Visual fields: R hemianopsia  5/5 strength in the BL upper and lower extremities  Sensation grossly intact in the BL upper and lower extremities  Finger-nose-finger intact BL  Other:     ED Results /  Procedures / Treatments  Labs (all labs ordered are listed, but only abnormal results are displayed) Labs Reviewed - No data to display   EKG     RADIOLOGY  Reviewed the CT of the head which shows a subacute infarct in the left occipital lobe  PROCEDURES:  Critical Care performed: No     MEDICATIONS ORDERED IN ED: Medications  aspirin chewable tablet 324 mg (has no administration in time range)  gadobutrol (GADAVIST) 1 MMOL/ML injection 9 mL (9 mLs Intravenous Contrast Given 07/09/21 2254)     IMPRESSION / MDM / ASSESSMENT AND PLAN / ED COURSE  I reviewed the triage vital signs and the nursing notes.                              Differential diagnosis includes, but is not limited to, CVA, mass, demyelinating disease  Patient is a 61 year old male presenting with visual field cut.  He noticed this sometime last week.  There is been no other associated neurologic symptoms.  Vital signs overall within normal limits, mildly hypertensive.  Patient has a right-sided hemianopsia but otherwise his neurologic exam is intact.  CT of his head shows a ill-defined lesion in the left occipital lobe consistent with likely subacute infarct.  We will obtain an MRI of the brain MRA head and neck.  Will require admission for ongoing stroke work-up.  Will be given an aspirin.   FINAL CLINICAL IMPRESSION(S) / ED DIAGNOSES   Final diagnoses:  Cerebrovascular accident (CVA), unspecified mechanism (HCC)     Rx / DC Orders   ED Discharge Orders     None        Note:  This document was prepared using Dragon voice recognition software and may include unintentional dictation errors.   Georga Hacking, MD 07/09/21 2351

## 2021-07-09 NOTE — ED Triage Notes (Signed)
Pt to ED via POV with c/o not being able to see out the right side of either eye that has been going on for almost a week. Pt was sent here from [facility] to r/o optic tract infarct

## 2021-07-09 NOTE — ED Notes (Signed)
Patient transported to MRI 

## 2021-07-09 NOTE — ED Provider Triage Note (Signed)
Emergency Medicine Provider Triage Evaluation Note  Chris Shelton , a 61 y.o. male  was evaluated in triage.  Pt complains of vision loss of right eye. Sent to ER for stroke rule out--concern for optic tract infarct. He also has a "sinus headache." Symptoms started about a week ago.  Review of Systems  Positive: Vision loss, headache Negative: Nausea, vomiting  Physical Exam  There were no vitals taken for this visit. Gen:   Awake, no distress  Resp:  Normal effort  MSK:   Moves extremities without difficulty  Other:  Awake, alert, oriented.  Medical Decision Making  Medically screening exam initiated at 6:22 PM.  Appropriate orders placed.  Franz Dell was informed that the remainder of the evaluation will be completed by another provider, this initial triage assessment does not replace that evaluation, and the importance of remaining in the ED until their evaluation is complete.    Chinita Pester, FNP 07/09/21 1826

## 2021-07-10 ENCOUNTER — Encounter: Payer: Self-pay | Admitting: Internal Medicine

## 2021-07-10 ENCOUNTER — Other Ambulatory Visit: Payer: Self-pay

## 2021-07-10 ENCOUNTER — Observation Stay (HOSPITAL_BASED_OUTPATIENT_CLINIC_OR_DEPARTMENT_OTHER)
Admit: 2021-07-10 | Discharge: 2021-07-10 | Disposition: A | Discharge status: Home / Self Care | Payer: BC Managed Care – PPO | Attending: Internal Medicine | Admitting: Internal Medicine

## 2021-07-10 DIAGNOSIS — I6389 Other cerebral infarction: Secondary | ICD-10-CM

## 2021-07-10 DIAGNOSIS — E871 Hypo-osmolality and hyponatremia: Secondary | ICD-10-CM | POA: Diagnosis not present

## 2021-07-10 DIAGNOSIS — I639 Cerebral infarction, unspecified: Secondary | ICD-10-CM

## 2021-07-10 DIAGNOSIS — Z8616 Personal history of COVID-19: Secondary | ICD-10-CM

## 2021-07-10 DIAGNOSIS — I10 Essential (primary) hypertension: Secondary | ICD-10-CM | POA: Diagnosis not present

## 2021-07-10 DIAGNOSIS — I6322 Cerebral infarction due to unspecified occlusion or stenosis of basilar arteries: Secondary | ICD-10-CM | POA: Diagnosis not present

## 2021-07-10 DIAGNOSIS — R001 Bradycardia, unspecified: Secondary | ICD-10-CM

## 2021-07-10 LAB — CBC WITH DIFFERENTIAL/PLATELET
Abs Immature Granulocytes: 0.03 10*3/uL (ref 0.00–0.07)
Basophils Absolute: 0.1 10*3/uL (ref 0.0–0.1)
Basophils Relative: 1 %
Eosinophils Absolute: 0.2 10*3/uL (ref 0.0–0.5)
Eosinophils Relative: 2 %
HCT: 43 % (ref 39.0–52.0)
Hemoglobin: 14.6 g/dL (ref 13.0–17.0)
Immature Granulocytes: 0 %
Lymphocytes Relative: 30 %
Lymphs Abs: 2.1 10*3/uL (ref 0.7–4.0)
MCH: 29.1 pg (ref 26.0–34.0)
MCHC: 34 g/dL (ref 30.0–36.0)
MCV: 85.7 fL (ref 80.0–100.0)
Monocytes Absolute: 0.4 10*3/uL (ref 0.1–1.0)
Monocytes Relative: 6 %
Neutro Abs: 4.3 10*3/uL (ref 1.7–7.7)
Neutrophils Relative %: 61 %
Platelets: 234 10*3/uL (ref 150–400)
RBC: 5.02 MIL/uL (ref 4.22–5.81)
RDW: 12 % (ref 11.5–15.5)
WBC: 7.1 10*3/uL (ref 4.0–10.5)
nRBC: 0 % (ref 0.0–0.2)

## 2021-07-10 LAB — CBC
HCT: 42.3 % (ref 39.0–52.0)
Hemoglobin: 14.6 g/dL (ref 13.0–17.0)
MCH: 29.2 pg (ref 26.0–34.0)
MCHC: 34.5 g/dL (ref 30.0–36.0)
MCV: 84.6 fL (ref 80.0–100.0)
Platelets: 211 10*3/uL (ref 150–400)
RBC: 5 MIL/uL (ref 4.22–5.81)
RDW: 12 % (ref 11.5–15.5)
WBC: 6.9 10*3/uL (ref 4.0–10.5)
nRBC: 0 % (ref 0.0–0.2)

## 2021-07-10 LAB — ECHOCARDIOGRAM COMPLETE
AR max vel: 2.22 cm2
AV Area VTI: 2.74 cm2
AV Area mean vel: 2.13 cm2
AV Mean grad: 3 mmHg
AV Peak grad: 5.1 mmHg
Ao pk vel: 1.13 m/s
Area-P 1/2: 3.46 cm2
Height: 69 in
MV VTI: 2.12 cm2
S' Lateral: 2.5 cm
Weight: 3184 oz

## 2021-07-10 LAB — BASIC METABOLIC PANEL
Anion gap: 7 (ref 5–15)
BUN: 18 mg/dL (ref 6–20)
CO2: 27 mmol/L (ref 22–32)
Calcium: 9 mg/dL (ref 8.9–10.3)
Chloride: 102 mmol/L (ref 98–111)
Creatinine, Ser: 0.8 mg/dL (ref 0.61–1.24)
GFR, Estimated: >60 mL/min (ref >60)
Glucose, Bld: 103 mg/dL — ABNORMAL HIGH (ref 70–99)
Potassium: 3.5 mmol/L (ref 3.5–5.1)
Sodium: 136 mmol/L (ref 135–145)

## 2021-07-10 LAB — URINE DRUG SCREEN, QUALITATIVE (ARMC ONLY)
Amphetamines, Ur Screen: NOT DETECTED
Barbiturates, Ur Screen: NOT DETECTED
Benzodiazepine, Ur Scrn: NOT DETECTED
Cannabinoid 50 Ng, Ur ~~LOC~~: NOT DETECTED
Cocaine Metabolite,Ur ~~LOC~~: NOT DETECTED
MDMA (Ecstasy)Ur Screen: NOT DETECTED
Methadone Scn, Ur: NOT DETECTED
Opiate, Ur Screen: NOT DETECTED
Phencyclidine (PCP) Ur S: NOT DETECTED
Tricyclic, Ur Screen: NOT DETECTED

## 2021-07-10 LAB — LIPID PANEL
Cholesterol: 221 mg/dL — ABNORMAL HIGH (ref 0–200)
HDL: 39 mg/dL — ABNORMAL LOW (ref >40)
LDL Cholesterol: 150 mg/dL — ABNORMAL HIGH (ref 0–99)
Total CHOL/HDL Ratio: 5.7 RATIO
Triglycerides: 159 mg/dL — ABNORMAL HIGH (ref <150)
VLDL: 32 mg/dL (ref 0–40)

## 2021-07-10 LAB — COMPREHENSIVE METABOLIC PANEL
ALT: 14 U/L (ref 0–44)
AST: 17 U/L (ref 15–41)
Albumin: 4.1 g/dL (ref 3.5–5.0)
Alkaline Phosphatase: 61 U/L (ref 38–126)
Anion gap: 5 (ref 5–15)
BUN: 21 mg/dL — ABNORMAL HIGH (ref 6–20)
CO2: 27 mmol/L (ref 22–32)
Calcium: 8.9 mg/dL (ref 8.9–10.3)
Chloride: 101 mmol/L (ref 98–111)
Creatinine, Ser: 0.71 mg/dL (ref 0.61–1.24)
GFR, Estimated: >60 mL/min (ref >60)
Glucose, Bld: 104 mg/dL — ABNORMAL HIGH (ref 70–99)
Potassium: 3.6 mmol/L (ref 3.5–5.1)
Sodium: 133 mmol/L — ABNORMAL LOW (ref 135–145)
Total Bilirubin: 0.8 mg/dL (ref 0.3–1.2)
Total Protein: 7.3 g/dL (ref 6.5–8.1)

## 2021-07-10 LAB — GLUCOSE, CAPILLARY: Glucose-Capillary: 100 mg/dL — ABNORMAL HIGH (ref 70–99)

## 2021-07-10 LAB — RESP PANEL BY RT-PCR (FLU A&B, COVID) ARPGX2
Influenza A by PCR: NEGATIVE
Influenza B by PCR: NEGATIVE
SARS Coronavirus 2 by RT PCR: NEGATIVE

## 2021-07-10 LAB — HIV ANTIBODY (ROUTINE TESTING W REFLEX): HIV Screen 4th Generation wRfx: NONREACTIVE

## 2021-07-10 MED ORDER — ATORVASTATIN CALCIUM 80 MG PO TABS: 80.0000 mg | ORAL_TABLET | Freq: Every day | ORAL | 0 refills | Status: DC

## 2021-07-10 MED ORDER — LABETALOL HCL 5 MG/ML IV SOLN
5.0000 mg | INTRAVENOUS | Status: DC | PRN
Start: 2021-07-10 — End: 2021-07-10

## 2021-07-10 MED ORDER — CLOPIDOGREL BISULFATE 75 MG PO TABS: 75.0000 mg | ORAL_TABLET | Freq: Every day | ORAL | 0 refills | Status: AC

## 2021-07-10 MED ORDER — ENOXAPARIN SODIUM 40 MG/0.4ML IJ SOSY
40.0000 mg | PREFILLED_SYRINGE | INTRAMUSCULAR | Status: DC
  Administered 2021-07-10: 40 mg via SUBCUTANEOUS
  Filled 2021-07-10: qty 0.4

## 2021-07-10 MED ORDER — CLOPIDOGREL BISULFATE 75 MG PO TABS: 75.0000 mg | ORAL_TABLET | Freq: Every day | ORAL | Status: DC

## 2021-07-10 MED ORDER — ACETAMINOPHEN 325 MG PO TABS: 650.0000 mg | ORAL_TABLET | ORAL | Status: DC | PRN

## 2021-07-10 MED ORDER — ASPIRIN EC 81 MG PO TBEC
81.0000 mg | DELAYED_RELEASE_TABLET | Freq: Every day | ORAL | Status: DC
  Administered 2021-07-10: 08:00:00 81 mg via ORAL
  Filled 2021-07-10: qty 1

## 2021-07-10 MED ORDER — STROKE: EARLY STAGES OF RECOVERY BOOK: Freq: Once | Status: DC

## 2021-07-10 MED ORDER — CLOPIDOGREL BISULFATE 75 MG PO TABS
75.0000 mg | ORAL_TABLET | Freq: Every day | ORAL | Status: DC
Start: 2021-07-10 — End: 2021-07-10

## 2021-07-10 MED ORDER — ASPIRIN 81 MG PO TBEC: 81.0000 mg | DELAYED_RELEASE_TABLET | Freq: Every day | ORAL | 11 refills | Status: DC

## 2021-07-10 MED ORDER — CLOPIDOGREL BISULFATE 75 MG PO TABS
300.0000 mg | ORAL_TABLET | Freq: Once | ORAL | Status: AC
  Administered 2021-07-10: 16:00:00 300 mg via ORAL
  Filled 2021-07-10: qty 4

## 2021-07-10 MED ORDER — LABETALOL HCL 5 MG/ML IV SOLN: 5.0000 mg | INTRAVENOUS | Status: DC | PRN

## 2021-07-10 MED ORDER — SENNOSIDES-DOCUSATE SODIUM 8.6-50 MG PO TABS: 1.0000 | ORAL_TABLET | Freq: Every evening | ORAL | Status: DC | PRN

## 2021-07-10 MED ORDER — ACETAMINOPHEN 650 MG RE SUPP: 650.0000 mg | RECTAL | Status: DC | PRN

## 2021-07-10 MED ORDER — ATORVASTATIN CALCIUM 20 MG PO TABS: 80.0000 mg | ORAL_TABLET | Freq: Every day | ORAL | Status: DC

## 2021-07-10 MED ORDER — ATORVASTATIN CALCIUM 20 MG PO TABS
20.0000 mg | ORAL_TABLET | Freq: Every day | ORAL | Status: DC
  Administered 2021-07-10: 20 mg via ORAL
  Filled 2021-07-10: qty 1

## 2021-07-10 MED ORDER — ACETAMINOPHEN 160 MG/5ML PO SOLN
650.0000 mg | ORAL | Status: DC | PRN
  Filled 2021-07-10: qty 20.3

## 2021-07-10 NOTE — TOC CM/SW Note (Signed)
Patient has orders to discharge home today. Chart reviewed. On room air. No wounds. No TOC needs identified. CSW signing off. ° °Lysbeth Dicola, CSW °[phone] ° °

## 2021-07-10 NOTE — Evaluation (Signed)
Occupational Therapy Evaluation Patient Details Name: HELIO LACK MRN: [id number] DOB: [birth year] Today's Date: 07/10/2021   History of Present Illness Hakim Minniefield is a 61 year old male with no prior medical diagnosis who presents emergency department for chief concerns of right eye blindness. Imaging significant for early subacute L PCA infarct with small amount of petechial hemorrhage   Clinical Impression   Mr. Negron was seen for OT evaluation this date. Prior to hospital admission, pt was independent in all aspects of ADL/IADL, working full time as a Quarry manager. Pt denied falls history in past 12 months. Pt lives with his spouse in a 3 story home with 4 steps to enter with no hand rails. Currently pt reporting demonstrates baseline independence to perform ADL and mobility tasks and no strength, sensory, coordination, or cognitive deficits appreciated with assessment. Pt continues to have a R visual field deficit with significant R side peripheral vision loss also appreciated on assessment. Pt endorses some vision return to his lower R visual quadrant in his L eye, but otherwise presents with symptoms classically associated with Homonymous Hemianopsia. Informational handout provided, and pt educated on compensatory strategies for mgt of R visual field deficit. Pt will benefit from follow up with vision and driver rehab to support return to baseline level of functional independence with work and driving. Pt strongly encouraged to refrain from driving until he can be formally assessed for safety. Driver [employer] provided. MD notified. No further skilled OT needs identified. Will sign off. Please re-consult if additional OT needs arise.       Recommendations for follow up therapy are one component of a multi-disciplinary discharge planning process, led by the attending physician.  Recommendations may be updated based on patient status, additional functional criteria and insurance  authorization.   Follow Up Recommendations  Other (comment) (OT follow-up for driving assessment/vision rehab.)    Assistance Recommended at Discharge PRN  Patient can return home with the following      Functional Status Assessment  Patient has not had a recent decline in their functional status  Equipment Recommendations       Recommendations for Other Services       Precautions / Restrictions Precautions Precautions: Fall Precaution Comments: Low fall Restrictions Other Position/Activity Restrictions: None      Mobility Bed Mobility Overal bed mobility: Independent                  Transfers Overall transfer level: Independent Equipment used: None                      Balance Overall balance assessment: Independent                                         ADL either performed or assessed with clinical judgement   ADL Overall ADL's : At baseline                                       General ADL Comments: Up independently in room. Performing all functional mobilit/ADL management at baseline level of functional independence.     Vision Baseline Vision/History: 1 Wears glasses;3 Museum/gallery conservator Macular Degeneration Patient Visual Report: Peripheral vision impairment Vision Assessment?: Yes Eye Alignment: Within Functional Limits Ocular Range of Motion: Within Functional Limits  Alignment/Gaze Preference: Within Defined Limits Tracking/Visual Pursuits: Able to track stimulus in all quads without difficulty Saccades: Within functional limits Visual Fields: Right homonymous hemianopsia;Right visual field deficit     Perception Perception Perception: Within Functional Limits   Praxis Praxis Praxis: Intact    Pertinent Vitals/Pain Pain Assessment Pain Assessment: No/denies pain     Hand Dominance     Extremity/Trunk Assessment Upper Extremity Assessment Upper Extremity Assessment: Overall WFL for  tasks assessed   Lower Extremity Assessment Lower Extremity Assessment: Overall WFL for tasks assessed   Cervical / Trunk Assessment Cervical / Trunk Assessment: Normal   Communication Communication Communication: No difficulties   Cognition Arousal/Alertness: Awake/alert Behavior During Therapy: WFL for tasks assessed/performed Overall Cognitive Status: Within Functional Limits for tasks assessed                                       General Comments       Exercises Other Exercises Other Exercises: Pt educated in role of OT in acute setting, driver rehabilitation, and vision rehabilitation services. Pt also educated on strategies to improve safety and functional independence in light of R visual field deficit. Handout on Homonymous Hemianopisa provided.   Shoulder Instructions      Home Living Family/patient expects to be discharged to:: Private residence Living Arrangements: Spouse/significant other Available Help at Discharge: Family;Available PRN/intermittently Type of Home: House Home Access: Stairs to enter [employer] CorporationEntrance Stairs-Number of Steps: 3-4 Entrance Stairs-Rails: None Home Layout: Multi-level (3 story home) Alternate Level Stairs-Number of Steps: full flight       Bathroom Toilet: Standard     Home Equipment: None          Prior Functioning/Environment Prior Level of Function : Independent/Modified Independent;Driving               ADLs Comments: Active and independent. Pt is working full time as a Magazine features editorprogrammer.        OT Problem List: Impaired vision/perception      OT Treatment/Interventions:      OT Goals(Current goals can be found in the care plan section) Acute Rehab OT Goals Patient Stated Goal: To follow up with my eye doctor. OT Goal Formulation: All assessment and education complete, DC therapy Time For Goal Achievement: 07/10/21 Potential to Achieve Goals: Good  OT Frequency:      Co-evaluation               AM-PAC OT "6 Clicks" Daily Activity     Outcome Measure Help from another person eating meals?: None Help from another person taking care of personal grooming?: None Help from another person toileting, which includes using toliet, bedpan, or urinal?: None Help from another person bathing (including washing, rinsing, drying)?: None Help from another person to put on and taking off regular upper body clothing?: None Help from another person to put on and taking off regular lower body clothing?: None 6 Click Score: 24   End of Session    Activity Tolerance: Patient tolerated treatment well Patient left: in bed  OT Visit Diagnosis: Other symptoms and signs involving the nervous system (R29.898)                Time: 1441-1511 OT Time Calculation (min): 30 min Charges:  OT General Charges $OT Visit: 1 Visit OT Evaluation $OT Eval Low Complexity: 1 Low OT Treatments $Self Care/Home Management : 8-22 mins  Wannetta Langland Smith RobertKingsbury,  M.S., OTR/L Feeding Team - [facility] Ascom: [phone] 07/10/21, 3:34 PM

## 2021-07-10 NOTE — Progress Notes (Signed)
*  PRELIMINARY RESULTS* Echocardiogram 2D Echocardiogram has been performed.  Cristela Blue 07/10/2021, 2:41 PM

## 2021-07-10 NOTE — Assessment & Plan Note (Signed)
-   Mild hyponatremia, asymptomatic

## 2021-07-10 NOTE — H&P (Signed)
History and Physical   BOSIE SUMMIT [id number] DOB: [birth year] DOA: 07/09/2021  PCP: Pcp, No  Outpatient Specialists: Dr. George Ina, ophthalmology Patient coming from: Ophthalmology clinic  I have personally briefly reviewed patient's old medical records in [place].  Chief Concern: Right eye blindness  HPI: Mr. Chris Shelton is a 61 year old male with no prior medical diagnosis who presents emergency department for chief concerns of right eye blindness.  Patient presents from outpatient eye doctor clinic.  Initial vitals in the emergency department showed temperature of 98.2, respiration rate of 16, heart rate of 60, blood pressure 134/69, SPO2 of 98% on room air.  Labs in the emergency department showed that the CBC with differential was within normal limits.  Serum sodium is 133, potassium 3.6, chloride of 101, bicarb of 27, nonfasting blood glucose 104, BUN 21, serum creatinine of 0.71, estimated GFR is greater than 60.  At bedside patient is able to tell me his full name, age, and current location of hospital.  He reports that since Thursday, 07/04/2021, he experienced right-sided blindness in both eyes.  This has been persistent.  He saw his ophthalmologist and was advised to come to the emergency department for possible stroke.  He denies this happening before.  He denies any chest pain, shortness of breath, abdominal pain, dysuria, diarrhea, syncopal event.  Social history: He lives at home with his wife and kids.  He denies any history of tobacco, EtOH, recreational drug use.  He works as a Dance movement psychotherapist.  Vaccination history: He states he is not vaccinated for COVID-19.  ROS: Constitutional: no weight change, no fever ENT/Mouth: no sore throat, no rhinorrhea Eyes: no eye pain, bilateral right-sided eye blindness Cardiovascular: no chest pain, no dyspnea,  no edema, no palpitations Respiratory: no cough, no sputum, no wheezing Gastrointestinal: no nausea, no  vomiting, no diarrhea, no constipation Genitourinary: no urinary incontinence, no dysuria, no hematuria Musculoskeletal: no arthralgias, no myalgias Skin: no skin lesions, no pruritus, Neuro: no weakness, no loss of consciousness, no syncope Psych: no anxiety, no depression, no decrease appetite Heme/Lymph: no bruising, no bleeding  ED Course: Discussed with emergency medicine provider, patient requiring hospitalization for chief concerns of left occipital parietal lobe stroke.  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 per EDP, I ordered aspirin 81 mg daily starting on 07/10/2021 - Ordered atorvastatin 20 mg nightly - a.m. team to consult in-house neurology for further recommendations - Complete echo - MRI of the brain and MRA of the brain ordered by EDP - a.m. fasting lipid and A1c ordered - Patient is outside permissive hypertension window - Frequent neuro vascular checks per hospital/floor protocol - Fall precaution - Admit to telemetry medical floor, observation  Cardiovascular and Mediastinum Essential hypertension Assessment & Plan - Patient has no prior diagnosis of hypertension - Labetalol 5 mg IV every 2 hours as needed for SBP greater than 170, 4 doses ordered  Other Hyponatremia Assessment & Plan - Mild hyponatremia, asymptomatic  Chart reviewed.   Outpatient 07/12/2020: Patient was diagnosed with COVID 19 infection.  Patient was prescribed Flonase, albuterol inhalation every 4 hours as needed, Tessalon Perles, prednisone 20 mg twice daily for 5 days, ondansetron as needed for nausea.  DVT prophylaxis: Enoxaparin 40 mg  subcutaneous every 24 hours Code Status: Full code Diet: Heart healthy Family Communication:  no Disposition Plan: Pending clinical course Consults called: None at this time Admission status: Telemetry medical, observation  Past Medical History:  Diagnosis Date   Cataracts, bilateral 2019   Detached retina    Palpitations    Sinus trouble 2019   Past Surgical History:  Procedure Laterality Date   CATARACT EXTRACTION Right    CATARACT EXTRACTION W/PHACO Left 08/25/2017   Procedure: CATARACT EXTRACTION PHACO AND INTRAOCULAR LENS PLACEMENT ([place]);  Surgeon: Birder Robson, MD;  Location: [facility] ORS;  Service: Ophthalmology;  Laterality: Left;  [place]   00:49.9 AP%   17.5 CDE   8.75 Fluid Pack Lot # [id number]   EYE SURGERY     HAND SURGERY     multiple surgeries   RETINAL DETACHMENT SURGERY Bilateral    right x 2 and left x 2   Social History:  reports that he has never smoked. He has never used smokeless tobacco. He reports that he does not drink alcohol and does not use drugs.  Allergies  Allergen Reactions   Penicillins Anaphylaxis   History reviewed. No pertinent family history. Family history: Family history reviewed and not pertinent.  Prior to Admission medications   Medication Sig Start Date End Date Taking? Authorizing Provider  azelastine (OPTIVAR) 0.05 % ophthalmic solution Place 1 drop into the left eye 2 (two) times daily. 02/27/19   Cuthriell, Charline Bills, PA-C  predniSONE (DELTASONE) 50 MG tablet Take 1 tablet (50 mg total) by mouth daily with breakfast. 02/27/19   Cuthriell, Charline Bills, PA-C   Physical Exam: Vitals:   07/09/21 1826 07/09/21 1857 07/09/21 2323  BP:  134/69 (!) 155/83  Pulse:  60 (!) 56  Resp:  16 16  Temp:  98.2 F (36.8 C)   TempSrc:  Oral   SpO2:  98% 99%  Weight: 90.3 kg    Height: 5\' 9"  (1.753 m)     Constitutional: appears age-appropriate, NAD, calm, comfortable Eyes: PERRL, lids and conjunctivae normal HENMT: Male pattern baldness, mucous membranes are moist. Posterior pharynx clear of any exudate or lesions. Age-appropriate  dentition. Hearing appropriate Neck: normal, supple, no masses, no thyromegaly Respiratory: clear to auscultation bilaterally, no wheezing, no crackles. Normal respiratory effort. No accessory muscle use.  Cardiovascular: Regular rate and rhythm, no murmurs / rubs / gallops. No extremity edema. 2+ pedal pulses. No carotid bruits.  Abdomen: no tenderness, no masses palpated, no hepatosplenomegaly. Bowel sounds positive.  Musculoskeletal: no clubbing / cyanosis. No joint deformity upper and lower extremities. Good ROM, no contractures, no atrophy. Normal muscle tone.  Skin: no rashes, lesions, ulcers. No induration Neurologic: Sensation intact. Strength 5/5 in all 4.  Psychiatric: Normal judgment and insight. Alert and oriented x 3. Normal mood.   EKG: independently reviewed, showing sinus bradycardia with rate of 55, QTc 390  CT head without contrast on Admission: I personally reviewed and I agree with radiologist reading as below.  CT Head Wo Contrast  Result Date: 07/09/2021 CLINICAL DATA:  Headache. Loss of vision right eye. Symptoms started a week ago. EXAM: CT HEAD WITHOUT CONTRAST TECHNIQUE: Contiguous axial images were obtained from the base of the skull through the vertex without intravenous contrast. RADIATION DOSE REDUCTION: This exam was performed according to the departmental dose-optimization program which includes automated exposure control, adjustment of the mA and/or kV according to patient size and/or use of iterative reconstruction technique. COMPARISON:  MRI head  04/30/2021 FINDINGS: Brain: Interval  development of ill-defined hypodensity left occipital parietal lobe compatible with subacute infarct. No associated acute hemorrhage. Ventricle size normal.  Negative for mass or acute hemorrhage. Vascular: Negative for hyperdense vessel Skull: Negative Sinuses/Orbits: Minimal mucosal edema paranasal sinuses. Bilateral ocular surgery. Other: None IMPRESSION: Ill-defined hypodensity left  occipital parietal lobe compatible with subacute infarct. No acute hemorrhage. Electronically Signed   By: Franchot Gallo M.D.   On: 07/09/2021 18:49   CT Orbits Wo Contrast  Result Date: 07/09/2021 CLINICAL DATA:  Vision loss right eye.  Symptoms started a week ago. EXAM: CT ORBITS WITHOUT CONTRAST TECHNIQUE: Multidetector CT imaging of the orbits was performed using the standard protocol without intravenous contrast. Multiplanar CT image reconstructions were also generated. RADIATION DOSE REDUCTION: This exam was performed according to the departmental dose-optimization program which includes automated exposure control, adjustment of the mA and/or kV according to patient size and/or use of iterative reconstruction technique. COMPARISON:  CT head 07/09/2021 FINDINGS: Orbits: No orbital mass or edema. Bilateral cataract extraction. Scleral banding on the left. Pituitary not enlarged. Cavernous sinus normal bilaterally. Visible paranasal sinuses: Mucosal edema paranasal sinuses. Small air-fluid level left maxillary sinus. Soft tissues: Negative for soft tissue mass or edema. Osseous: No fracture or skeletal lesion. IMPRESSION: No orbital lesion.  Prior orbital surgery. Sinus mucosal edema.  Air-fluid level left maxillary sinus. Electronically Signed   By: Franchot Gallo M.D.   On: 07/09/2021 18:51    Labs on Admission: I have personally reviewed following labs CBC: Recent Labs  Lab 07/10/21 0018  WBC 7.1  NEUTROABS 4.3  HGB 14.6  HCT 43.0  MCV 85.7  PLT [id number]   Basic Metabolic Panel: Recent Labs  Lab 07/10/21 0018  NA 133*  K 3.6  CL 101  CO2 27  GLUCOSE 104*  BUN 21*  CREATININE 0.71  CALCIUM 8.9   GFR: Estimated Creatinine Clearance: 109 mL/min (by C-G formula based on SCr of 0.71 mg/dL).  Liver Function Tests: Recent Labs  Lab 07/10/21 0018  AST 17  ALT 14  ALKPHOS 61  BILITOT 0.8  PROT 7.3  ALBUMIN 4.1   Dr. Tobie Poet [facility]  If 7PM-7AM, please contact  overnight-coverage provider If 7AM-7PM, please contact day coverage provider [email or web address]  07/10/2021, 1:09 AM

## 2021-07-10 NOTE — ED Notes (Addendum)
Stroke Swallow Screen performed. Pt has passed screen. Provider notified

## 2021-07-10 NOTE — Progress Notes (Addendum)
Pt was admitted on the floor with nos signs of distress. Pt alert x 4. VSS, Pt was educated about safety and ascom within pt reach. Pt was handed education for stroke booklet and was educated. No question at this time. Will continue to monitor.

## 2021-07-10 NOTE — Assessment & Plan Note (Signed)
Asymptomatic. 

## 2021-07-10 NOTE — Plan of Care (Signed)
°  Problem: Education: Goal: Knowledge of General Education information will improve Description: Including pain rating scale, medication(s)/side effects and non-pharmacologic comfort measures Outcome: Progressing   Problem: Health Behavior/Discharge Planning: Goal: Ability to manage health-related needs will improve Outcome: Progressing   Problem: Clinical Measurements: Goal: Respiratory complications will improve Outcome: Progressing   Problem: Safety: Goal: Ability to remain free from injury will improve Outcome: Progressing   Problem: Education: Goal: Knowledge of secondary prevention will improve (SELECT ALL) Outcome: Progressing

## 2021-07-10 NOTE — Hospital Course (Addendum)
Mr. Chris Shelton is a 62 year old male with no prior medical diagnosis who presents emergency department for chief concerns of right eye blindness.  Patient presents from outpatient eye doctor clinic.  Initial vitals in the emergency department showed temperature of 98.2, respiration rate of 16, heart rate of 60, blood pressure 134/69, SPO2 of 98% on room air.  Labs in the emergency department showed that the CBC with differential was within normal limits.  Serum sodium is 133, potassium 3.6, chloride of 101, bicarb of 27, nonfasting blood glucose 104, BUN 21, serum creatinine of 0.71, estimated GFR is greater than 60.

## 2021-07-10 NOTE — Assessment & Plan Note (Signed)
-   Patient has no prior diagnosis of hypertension - Labetalol 5 mg IV every 2 hours as needed for SBP greater than 170, 4 doses ordered

## 2021-07-10 NOTE — Consult Note (Signed)
NEUROLOGY CONSULTATION NOTE   Date of service: July 10, 2021 Patient Name: Chris Shelton MRN:  [id number] DOB:  [birth year] Reason for consult: L PCA ischemic stroke Requesting physician: Dr. Max Sane _ _ _   _ __   _ __ _ _  __ __   _ __   __ _  History of Present Illness   This is a 61 year old gentleman with a past medical history significant for cataracts, detached retina who presented to the ED with inability to see out of the right side of both eyes since last Thursday.  He has no other neurologic deficits. His vision is unchanged over past 5 days. CNS imaging was personally reviewed and also discussed by phone with stroke neurologist Dr. Leonie Man. Patient has early subacute L PCA infarct with small amount of petechial hemorrhage. 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 Only other known risk factor for CVA is HL.      Component Value Date/Time   CHOL 221 (H) 07/10/2021 0623   TRIG 159 (H) 07/10/2021 0623   HDL 39 (L) 07/10/2021 0623   CHOLHDL 5.7 07/10/2021 0623   VLDL 32 07/10/2021 0623   LDLCALC 150 (H) 07/10/2021 0623    TTE, A1c pending    ROS   Per HPI: all other systems reviewed and are negative  Past History   I have reviewed the following:  Past Medical History:  Diagnosis Date   Cataracts, bilateral 2019   Detached retina    Palpitations    Sinus trouble 2019   Past Surgical History:  Procedure Laterality Date   CATARACT EXTRACTION Right    CATARACT EXTRACTION W/PHACO Left 08/25/2017   Procedure: CATARACT EXTRACTION PHACO AND INTRAOCULAR LENS PLACEMENT ([place]);  Surgeon: Birder Robson, MD;  Location: [facility] ORS;  Service: Ophthalmology;  Laterality: Left;  [place]   00:49.9 AP%   17.5 CDE   8.75 Fluid Pack Lot # [id number]   EYE SURGERY     HAND SURGERY     multiple surgeries   RETINAL DETACHMENT SURGERY Bilateral     right x 2 and left x 2   History reviewed. No pertinent family history. Social History   Socioeconomic History   Marital status: Married    Spouse name: Not on file   Number of children: Not on file   Years of education: Not on file   Highest education level: Not on file  Occupational History   Not on file  Tobacco Use   Smoking status: Never   Smokeless tobacco: Never  Vaping Use   Vaping Use: Never used  Substance and Sexual Activity   Alcohol use: No   Drug use: Never   Sexual activity: Yes    Partners: Female  Other Topics Concern   Not on file  Social History Narrative   Not on file   Social Determinants of Health   Financial Resource Strain: Not on file  Food Insecurity: Not on file  Transportation Needs: Not on file  Physical Activity: Not on file  Stress: Not on file  Social Connections: Not on file   Allergies  Allergen Reactions   Penicillins Anaphylaxis    Medications   Medications Prior to Admission  Medication Sig Dispense Refill Last Dose   brimonidine-timolol (COMBIGAN) 0.2-0.5 % ophthalmic solution Place 1 drop into both eyes 2 (two) times daily.   07/09/2021  fluticasone (FLONASE) 50 MCG/ACT nasal spray Place 2 sprays into both nostrils every 12 (twelve) hours as needed.   Past Week      Current Facility-Administered Medications:     stroke: mapping our early stages of recovery book, , Does not apply, Once, Cox, Amy N, DO   acetaminophen (TYLENOL) tablet 650 mg, 650 mg, Oral, Q4H PRN **OR** acetaminophen (TYLENOL) 160 MG/5ML solution 650 mg, 650 mg, Per Tube, Q4H PRN **OR** acetaminophen (TYLENOL) suppository 650 mg, 650 mg, Rectal, Q4H PRN, Cox, Amy N, DO   aspirin EC tablet 81 mg, 81 mg, Oral, Daily, Cox, Amy N, DO, 81 mg at 07/10/21 0807   atorvastatin (LIPITOR) tablet 80 mg, 80 mg, Oral, QHS, Derek Jack, MD   clopidogrel (PLAVIX) tablet 300 mg, 300 mg, Oral, Once **FOLLOWED BY** [START ON 07/11/2021] clopidogrel (PLAVIX) tablet 75  mg, 75 mg, Oral, Daily, Derek Jack, MD   enoxaparin (LOVENOX) injection 40 mg, 40 mg, Subcutaneous, Q24H, Cox, Amy N, DO, 40 mg at 07/10/21 0806   labetalol (NORMODYNE) injection 5 mg, 5 mg, Intravenous, Q2H PRN, Cox, Amy N, DO   senna-docusate (Senokot-S) tablet 1 tablet, 1 tablet, Oral, QHS PRN, Cox, Amy N, DO  Vitals   Vitals:   07/10/21 0430 07/10/21 0557 07/10/21 0750 07/10/21 1137  BP: (!) 160/87 (!) 151/67 (!) 152/75 131/76  Pulse: (!) 54  (!) 57 (!) 57  Resp: 16 18 16 16   Temp: (!) 97.5 F (36.4 C) 97.8 F (36.6 C) 98.4 F (36.9 C) 98.5 F (36.9 C)  TempSrc: Oral Oral    SpO2: 99% 99% 97% 97%  Weight:      Height:         Body mass index is 29.39 kg/m.  Physical Exam   Physical Exam Gen: A&O x4, NAD HEENT: Atraumatic, normocephalic;mucous membranes moist; oropharynx clear, tongue without atrophy or fasciculations. Neck: Supple, trachea midline. Resp: CTAB, no w/r/r CV: RRR, no m/g/r; nml S1 and S2. 2+ symmetric peripheral pulses. Abd: soft/NT/ND; nabs x 4 quad Extrem: Nml bulk; no cyanosis, clubbing, or edema.  Neuro: *MS: A&O x4. Follows multi-step commands.  *Speech: fluid, nondysarthric, able to name and repeat *CN:    I: Deferred   II,III: PERRLA, R homonymous hemianopsia, optic discs unable to be visualized 2/2 pupillary constriction   III,IV,VI: EOMI w/o nystagmus, no ptosis   V: Sensation intact from V1 to V3 to LT   VII: Eyelid closure was full.  Smile symmetric.   VIII: Hearing intact to voice   IX,X: Voice normal, palate elevates symmetrically    XI: SCM/trap 5/5 bilat   XII: Tongue protrudes midline, no atrophy or fasciculations   *Motor:   Normal bulk.  No tremor, rigidity or bradykinesia. No pronator drift.    Strength: Dlt Bic Tri WrE WrF FgS Gr HF KnF KnE PlF DoF    Left 5 5 5 5 5 5 5 5 5 5 5 5     Right 5 5 5 5 5 5 5 5 5 5 5 5     *Sensory: Intact to light touch, pinprick, temperature vibration throughout. Symmetric. Propioception  intact bilat.  No double-simultaneous extinction.  *Coordination:  Finger-to-nose, heel-to-shin, rapid alternating motions were intact. *Reflexes:  2+ and symmetric throughout without clonus; toes down-going bilat *Gait: deferred  NIHSS = 1 visual fields   Premorbid mRS = 0   Labs   CBC:  Recent Labs  Lab 07/10/21 0018 07/10/21 0623  WBC 7.1 6.9  NEUTROABS 4.3  --  HGB 14.6 14.6  HCT 43.0 42.3  MCV 85.7 84.6  PLT 234 [id number]    Basic Metabolic Panel:  Lab Results  Component Value Date   NA 136 07/10/2021   K 3.5 07/10/2021   CO2 27 07/10/2021   GLUCOSE 103 (H) 07/10/2021   BUN 18 07/10/2021   CREATININE 0.80 07/10/2021   CALCIUM 9.0 07/10/2021   GFRNONAA >60 07/10/2021   GFRAA >60 04/17/2014   Lipid Panel:  Lab Results  Component Value Date   LDLCALC 150 (H) 07/10/2021   HgbA1c: No results found for: HGBA1C Urine Drug Screen:     Component Value Date/Time   LABOPIA NONE DETECTED 07/10/2021 0800   COCAINSCRNUR NONE DETECTED 07/10/2021 0800   LABBENZ NONE DETECTED 07/10/2021 0800   AMPHETMU NONE DETECTED 07/10/2021 0800   THCU NONE DETECTED 07/10/2021 0800   LABBARB NONE DETECTED 07/10/2021 0800    Alcohol Level No results found for: ETH   Impression   This is a 61 year old gentleman with a past medical history significant for cataracts, detached retina who presented to the ED with inability to see out of the right side of both eyes since last Thursday.  On examination he has R homonymous hemianopsia confirmed to be 2/2 L PCA infarct. 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 Unfortunately elective stenting of this region is extremely high risk for causing brainstem stroke and in the absent of acute occlusion is not recommended by current guidelines. He should be on dual antiplatelet therapy for at least 90 days with aggressive mgmt of other risk factors (atorvastatin 80mg ).  Recommendations   - ASA 81mg  daily  indefinitely - Plavix load 300mg  now f/b 75mg  daily for at least 90 days, this will be readdressed in neurology clinic - Strict avoidance of hypotension (ideally maintain BP 99991111 systolic) 2/2 high grade basilar stenosis - Atorvastatin 80mg  daily - A1c pending; aggressive mgmt of DM if diagnosed - TTE pending. If TTE shows no intracardiac clot and no other sig abnl, patient may be discharged home - I will arrange outpatient neurology f/u in our stroke clinic - I gave patient strict return precautions to call 911 immediately if he experiences any new or worsening neurologic sx - No driving until cleared by outpatient neuro or OT  Su Monks, MD [facility] [phone]  If 7pm- 7am, please page neurology on call as listed in [place].  ______________________________________________________________________   Thank you for the opportunity to take part in the care of this patient. If you have any further questions, please contact the neurology consultation attending.  Signed,  Su Monks, MD [facility] [phone]  If 7pm- 7am, please page neurology on call as listed in [place].

## 2021-07-10 NOTE — Assessment & Plan Note (Addendum)
-   Acute to subacute left occipital parietal lobe stroke - Patient is status post aspirin 324 mg p.o. per EDP, I ordered aspirin 81 mg daily starting on 07/10/2021 - Ordered atorvastatin 20 mg nightly - a.m. team to consult in-house neurology for further recommendations - Complete echo - MRI of the brain and MRA of the brain ordered by EDP - a.m. fasting lipid and A1c ordered - Patient is outside permissive hypertension window - Frequent neuro vascular checks per hospital/floor protocol - Fall precaution - Admit to telemetry medical floor, observation

## 2021-07-11 LAB — HEMOGLOBIN A1C
Hgb A1c MFr Bld: 5.6 % (ref 4.8–5.6)
Mean Plasma Glucose: 114 mg/dL

## 2021-07-11 NOTE — Discharge Summary (Signed)
Physician Discharge Summary   Patient: Chris Shelton MRN: [id number] DOB: 01/10/1961  Admit date:     07/09/2021  Discharge date: 07/10/2021  Discharge Physician: Delfino Lovett   PCP: Pcp, No   Recommendations at discharge:   Follow-up with outpatient providers as requested  Discharge Diagnoses Principal Problem:   Stroke [facility]) Active Problems:   Essential hypertension   History of COVID-19   Hyponatremia   Sinus bradycardia   Basilar artery stenosis with infarction [facility])  Hospital Course   Chris Shelton is a 61 year old male with no prior medical diagnosis who presents emergency department for chief concerns of right eye blindness.  Patient presents from outpatient eye doctor clinic.  Initial vitals in the emergency department showed temperature of 98.2, respiration rate of 16, heart rate of 60, blood pressure 134/69, SPO2 of 98% on room air.  Labs in the emergency department showed that the CBC with differential was within normal limits.  Serum sodium is 133, potassium 3.6, chloride of 101, bicarb of 27, nonfasting blood glucose 104, BUN 21, serum creatinine of 0.71, estimated GFR is greater than 60.   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 -Dual antiplatelet therapy for at least 90 days with aspirin and Plavix followed by aspirin alone -Continue high intensity statin with Lipitor -Strict avoidance of hypotension/try to maintain blood pressure more than 130 systolic due to high-grade basilar stenosis 2D echo within normal limit with no intracardiac clot seen -Outpatient follow-up with stroke clinic in [place] -No driving until cleared by outpatient neurology    Consultants: Neurology Disposition: Home Diet recommendation:  Cardiac diet  DISCHARGE MEDICATION: Allergies as of 07/10/2021       Reactions   Penicillins Anaphylaxis        Medication List     TAKE these medications    aspirin 81 MG EC tablet Take 1 tablet (81 mg total) by mouth daily. Swallow whole.   atorvastatin 80 MG tablet Commonly known as: LIPITOR Take 1 tablet (80 mg total) by mouth at bedtime.   brimonidine-timolol 0.2-0.5 % ophthalmic solution Commonly known as: COMBIGAN Place 1 drop into both eyes 2 (two) times daily.   clopidogrel 75 MG tablet Commonly known as: PLAVIX Take 1 tablet (75 mg total) by mouth daily.   fluticasone 50 MCG/ACT nasal spray Commonly known as: FLONASE Place 2 sprays into both nostrils every 12 (twelve) hours as needed.        Follow-up Information     [facility]. Schedule an appointment as soon as possible for a visit in 1 week(s).   Why: [facility] Discharge F/UP Contact information: [address]     [address] [place] [address] [phone]        Galen Manila, MD. Schedule an appointment as soon as possible for a visit in 2 week(s).   Specialty: Ophthalmology Why: [facility] Discharge F/UP Contact information: [address] [place] [address] [phone]                 Discharge Exam: Ceasar Mons Weights   07/09/21 1826  Weight: 90.3 kg   Constitutional: appears age-appropriate, NAD, calm, comfortable Eyes: PERRL, lids and conjunctivae normal HENMT: Male pattern baldness, mucous membranes are moist. Posterior pharynx clear of any exudate or lesions.  Age-appropriate dentition. Hearing appropriate Neck: normal, supple, no masses, no thyromegaly Respiratory: clear to auscultation bilaterally, no wheezing, no crackles. Normal respiratory effort. No accessory muscle use.  Cardiovascular: Regular rate and rhythm, no murmurs / rubs / gallops. No extremity edema. 2+ pedal pulses. No carotid bruits.  Abdomen: no  tenderness, no masses palpated, no hepatosplenomegaly. Bowel sounds positive.  Musculoskeletal: no clubbing / cyanosis. No joint deformity upper and lower extremities. Good ROM, no contractures, no atrophy. Normal muscle tone.  Skin: no rashes, lesions, ulcers. No induration Neurologic: Sensation intact. Strength 5/5 in all 4.  Right homonymous hemianopsia Psychiatric: Normal judgment and insight. Alert and oriented x 3. Normal mood.    Condition at discharge: good  The results of significant diagnostics from this hospitalization (including imaging, microbiology, ancillary and laboratory) are listed below for reference.   Imaging Studies: CT Head Wo Contrast  Result Date: 07/09/2021 CLINICAL DATA:  Headache. Loss of vision right eye. Symptoms started a week ago. EXAM: CT HEAD WITHOUT CONTRAST TECHNIQUE: Contiguous axial images were obtained from the base of the skull through the vertex without intravenous contrast. RADIATION DOSE REDUCTION: This exam was performed according to the departmental dose-optimization program which includes automated exposure control, adjustment of the mA and/or kV according to patient size and/or use of iterative reconstruction technique. COMPARISON:  MRI head  04/30/2021 FINDINGS: Brain: Interval development of ill-defined hypodensity left occipital parietal lobe compatible with subacute infarct. No associated acute hemorrhage. Ventricle size normal.  Negative for mass or acute hemorrhage. Vascular: Negative for hyperdense vessel Skull: Negative Sinuses/Orbits: Minimal mucosal edema paranasal sinuses. Bilateral ocular surgery. Other: None IMPRESSION: Ill-defined hypodensity left occipital parietal lobe compatible with subacute infarct. No acute hemorrhage. Electronically Signed   By: Marlan Palau M.D.   On: 07/09/2021 18:49   MR ANGIO HEAD WO CONTRAST  Result Date: 07/10/2021 CLINICAL DATA:  Initial evaluation for neuro deficit, stroke suspected. EXAM: MRI HEAD  WITHOUT CONTRAST MRA HEAD WITHOUT CONTRAST MRA NECK WITHOUT AND WITH CONTRAST TECHNIQUE: Multiplanar, multi-echo pulse sequences of the brain and surrounding structures were acquired without intravenous contrast. Angiographic images of the Circle of Willis were acquired using MRA technique without intravenous contrast. Angiographic images of the neck were acquired using MRA technique without and with intravenous contrast. Carotid stenosis measurements (when applicable) are obtained utilizing NASCET criteria, using the distal internal carotid diameter as the denominator. CONTRAST:  31mL GADAVIST GADOBUTROL 1 MMOL/ML IV SOLN COMPARISON:  Prior CT from earlier the same day. FINDINGS: MRI HEAD FINDINGS Brain: Cerebral volume within normal limits. No significant cerebral white matter disease for age. Restricted diffusion involving the left parieto-occipital region, consistent with acute to early subacute left PCA distribution infarct. Associated gyral swelling and edema without significant regional mass effect. Scattered areas of intrinsic T1 hyperintensity and susceptibility artifact within the area of infarction consistent with petechial hemorrhage. No frank hemorrhagic transformation or intraparenchymal hematoma formation. No other evidence for acute or subacute ischemia. No other areas of chronic cortical infarction. No other acute or chronic blood products. No mass lesion or midline shift. No hydrocephalus or extra-axial fluid collection. Pituitary gland and suprasellar region normal. Vascular: Focal heterogeneous signal abnormality seen within the mid basilar artery, corresponding with a high-grade stenosis seen on corresponding MRA (series 10, image 8). Major intracranial vascular flow voids are otherwise maintained. Skull and upper cervical spine: Craniocervical junction within normal limits. Bone marrow signal intensity normal. No scalp soft tissue abnormality. Sinuses/Orbits: Patient status post bilateral ocular  lens replacement. Prior scleral  buckle on the left. Globes orbital soft tissues demonstrate no acute finding. Scattered mucosal thickening noted throughout the paranasal sinuses. No mastoid effusion. Inner ear structures grossly normal. Other: None. MRA HEAD FINDINGS Anterior circulation: Visualized distal cervical segments of the internal carotid arteries are patent with antegrade flow. Petrous, cavernous, and supraclinoid segments patent without stenosis or other abnormality. A1 segments patent bilaterally. Normal anterior communicating artery complex. Evaluation of the anterior cerebral artery somewhat limited by motion, but are grossly patent to their distal aspects without appreciable stenosis. No visible M1 stenosis or occlusion. Normal MCA bifurcations. Distal MCA branches perfused and symmetric. Posterior circulation: Both V4 segments patent to the vertebrobasilar junction without stenosis. Neither PICA origin well visualized. Basilar widely patent proximally. There is a focal flow gap within the mid basilar artery, consistent with a severe high-grade stenosis (series 1047, image 12). Stenosis measures approximately 3 mm in length. Basilar otherwise patent distally. No made of a possible short-segment fenestration at the distal basilar artery just prior to the takeoff of the SCA is. The SCA is themselves are patent. Both PCAs supplied via the basilar as well as small bilateral posterior communicating arteries. Right PCA grossly patent to its distal aspect. Left PCA patent through its P2 segment, but is attenuated distally, in keeping with the acute left PCA distribution infarct. Anatomic variants: None significant.  No aneurysm. MRA NECK FINDINGS Aortic arch: Visualized aortic arch normal in caliber with normal branch pattern. No stenosis about the origin of the great vessels. Right carotid system: Right common and internal carotid arteries patent without stenosis or evidence for dissection. No significant  atheromatous narrowing about the right carotid bulb. Left carotid system: Left common and internal carotid arteries patent without stenosis or evidence for dissection. No significant atheromatous narrowing about the left carotid bulb. Vertebral arteries: Both vertebral arteries arise from the subclavian arteries. No proximal subclavian artery stenosis. Vertebral arteries patent within the neck without stenosis or evidence for dissection. Other: None IMPRESSION: MRI HEAD: 1. 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 Associated petechial hemorrhage without frank hemorrhagic transformation or significant regional mass effect. 2. Otherwise normal brain MRI for age. MRA HEAD: 1. Focal severe near occlusive/high-grade stenosis involving the mid basilar artery as above. Neuro interventional consultation suggested, as this patient may be a candidate for vascular stenting. 2. Otherwise negative intracranial MRA. No large vessel occlusion. No other hemodynamically significant or correctable stenosis. MRA NECK: Normal MRA of the neck. Electronically Signed   By: Rise MuBenjamin  McClintock M.D.   On: 07/10/2021 03:21   MR Angiogram Neck W or Wo Contrast  Result Date: 07/10/2021 CLINICAL DATA:  Initial evaluation for neuro deficit, stroke suspected. EXAM: MRI HEAD WITHOUT CONTRAST MRA HEAD WITHOUT CONTRAST MRA NECK WITHOUT AND WITH CONTRAST TECHNIQUE: Multiplanar, multi-echo pulse sequences of the brain and surrounding structures were acquired without intravenous contrast. Angiographic images of the Circle of Willis were acquired using MRA technique without intravenous contrast. Angiographic images of the neck were acquired using MRA technique without and with intravenous contrast. Carotid stenosis measurements (when applicable) are obtained utilizing NASCET criteria, using the distal internal carotid diameter as the denominator. CONTRAST:  9mL GADAVIST GADOBUTROL 1 MMOL/ML IV SOLN COMPARISON:  Prior CT from earlier  the same day. FINDINGS: MRI HEAD FINDINGS Brain: Cerebral volume within normal limits. No significant cerebral white matter disease for age. Restricted diffusion involving the left parieto-occipital region, consistent with acute to early subacute left PCA distribution infarct. Associated gyral swelling and edema without significant regional  mass effect. Scattered areas of intrinsic T1 hyperintensity and susceptibility artifact within the area of infarction consistent with petechial hemorrhage. No frank hemorrhagic transformation or intraparenchymal hematoma formation. No other evidence for acute or subacute ischemia. No other areas of chronic cortical infarction. No other acute or chronic blood products. No mass lesion or midline shift. No hydrocephalus or extra-axial fluid collection. Pituitary gland and suprasellar region normal. Vascular: Focal heterogeneous signal abnormality seen within the mid basilar artery, corresponding with a high-grade stenosis seen on corresponding MRA (series 10, image 8). Major intracranial vascular flow voids are otherwise maintained. Skull and upper cervical spine: Craniocervical junction within normal limits. Bone marrow signal intensity normal. No scalp soft tissue abnormality. Sinuses/Orbits: Patient status post bilateral ocular lens replacement. Prior scleral buckle on the left. Globes orbital soft tissues demonstrate no acute finding. Scattered mucosal thickening noted throughout the paranasal sinuses. No mastoid effusion. Inner ear structures grossly normal. Other: None. MRA HEAD FINDINGS Anterior circulation: Visualized distal cervical segments of the internal carotid arteries are patent with antegrade flow. Petrous, cavernous, and supraclinoid segments patent without stenosis or other abnormality. A1 segments patent bilaterally. Normal anterior communicating artery complex. Evaluation of the anterior cerebral artery somewhat limited by motion, but are grossly patent to their  distal aspects without appreciable stenosis. No visible M1 stenosis or occlusion. Normal MCA bifurcations. Distal MCA branches perfused and symmetric. Posterior circulation: Both V4 segments patent to the vertebrobasilar junction without stenosis. Neither PICA origin well visualized. Basilar widely patent proximally. There is a focal flow gap within the mid basilar artery, consistent with a severe high-grade stenosis (series 1047, image 12). Stenosis measures approximately 3 mm in length. Basilar otherwise patent distally. No made of a possible short-segment fenestration at the distal basilar artery just prior to the takeoff of the SCA is. The SCA is themselves are patent. Both PCAs supplied via the basilar as well as small bilateral posterior communicating arteries. Right PCA grossly patent to its distal aspect. Left PCA patent through its P2 segment, but is attenuated distally, in keeping with the acute left PCA distribution infarct. Anatomic variants: None significant.  No aneurysm. MRA NECK FINDINGS Aortic arch: Visualized aortic arch normal in caliber with normal branch pattern. No stenosis about the origin of the great vessels. Right carotid system: Right common and internal carotid arteries patent without stenosis or evidence for dissection. No significant atheromatous narrowing about the right carotid bulb. Left carotid system: Left common and internal carotid arteries patent without stenosis or evidence for dissection. No significant atheromatous narrowing about the left carotid bulb. Vertebral arteries: Both vertebral arteries arise from the subclavian arteries. No proximal subclavian artery stenosis. Vertebral arteries patent within the neck without stenosis or evidence for dissection. Other: None IMPRESSION: MRI HEAD: 1. 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 Associated petechial hemorrhage without frank hemorrhagic transformation or significant regional mass effect. 2. Otherwise normal  brain MRI for age. MRA HEAD: 1. Focal severe near occlusive/high-grade stenosis involving the mid basilar artery as above. Neuro interventional consultation suggested, as this patient may be a candidate for vascular stenting. 2. Otherwise negative intracranial MRA. No large vessel occlusion. No other hemodynamically significant or correctable stenosis. MRA NECK: Normal MRA of the neck. Electronically Signed   By: Rise MuBenjamin  McClintock M.D.   On: 07/10/2021 03:21   MR BRAIN WO CONTRAST  Result Date: 07/10/2021 CLINICAL DATA:  Initial evaluation for neuro deficit, stroke suspected. EXAM: MRI HEAD WITHOUT CONTRAST MRA HEAD WITHOUT CONTRAST MRA NECK WITHOUT AND WITH  CONTRAST TECHNIQUE: Multiplanar, multi-echo pulse sequences of the brain and surrounding structures were acquired without intravenous contrast. Angiographic images of the Circle of Willis were acquired using MRA technique without intravenous contrast. Angiographic images of the neck were acquired using MRA technique without and with intravenous contrast. Carotid stenosis measurements (when applicable) are obtained utilizing NASCET criteria, using the distal internal carotid diameter as the denominator. CONTRAST:  9mL GADAVIST GADOBUTROL 1 MMOL/ML IV SOLN COMPARISON:  Prior CT from earlier the same day. FINDINGS: MRI HEAD FINDINGS Brain: Cerebral volume within normal limits. No significant cerebral white matter disease for age. Restricted diffusion involving the left parieto-occipital region, consistent with acute to early subacute left PCA distribution infarct. Associated gyral swelling and edema without significant regional mass effect. Scattered areas of intrinsic T1 hyperintensity and susceptibility artifact within the area of infarction consistent with petechial hemorrhage. No frank hemorrhagic transformation or intraparenchymal hematoma formation. No other evidence for acute or subacute ischemia. No other areas of chronic cortical infarction. No  other acute or chronic blood products. No mass lesion or midline shift. No hydrocephalus or extra-axial fluid collection. Pituitary gland and suprasellar region normal. Vascular: Focal heterogeneous signal abnormality seen within the mid basilar artery, corresponding with a high-grade stenosis seen on corresponding MRA (series 10, image 8). Major intracranial vascular flow voids are otherwise maintained. Skull and upper cervical spine: Craniocervical junction within normal limits. Bone marrow signal intensity normal. No scalp soft tissue abnormality. Sinuses/Orbits: Patient status post bilateral ocular lens replacement. Prior scleral buckle on the left. Globes orbital soft tissues demonstrate no acute finding. Scattered mucosal thickening noted throughout the paranasal sinuses. No mastoid effusion. Inner ear structures grossly normal. Other: None. MRA HEAD FINDINGS Anterior circulation: Visualized distal cervical segments of the internal carotid arteries are patent with antegrade flow. Petrous, cavernous, and supraclinoid segments patent without stenosis or other abnormality. A1 segments patent bilaterally. Normal anterior communicating artery complex. Evaluation of the anterior cerebral artery somewhat limited by motion, but are grossly patent to their distal aspects without appreciable stenosis. No visible M1 stenosis or occlusion. Normal MCA bifurcations. Distal MCA branches perfused and symmetric. Posterior circulation: Both V4 segments patent to the vertebrobasilar junction without stenosis. Neither PICA origin well visualized. Basilar widely patent proximally. There is a focal flow gap within the mid basilar artery, consistent with a severe high-grade stenosis (series 1047, image 12). Stenosis measures approximately 3 mm in length. Basilar otherwise patent distally. No made of a possible short-segment fenestration at the distal basilar artery just prior to the takeoff of the SCA is. The SCA is themselves are  patent. Both PCAs supplied via the basilar as well as small bilateral posterior communicating arteries. Right PCA grossly patent to its distal aspect. Left PCA patent through its P2 segment, but is attenuated distally, in keeping with the acute left PCA distribution infarct. Anatomic variants: None significant.  No aneurysm. MRA NECK FINDINGS Aortic arch: Visualized aortic arch normal in caliber with normal branch pattern. No stenosis about the origin of the great vessels. Right carotid system: Right common and internal carotid arteries patent without stenosis or evidence for dissection. No significant atheromatous narrowing about the right carotid bulb. Left carotid system: Left common and internal carotid arteries patent without stenosis or evidence for dissection. No significant atheromatous narrowing about the left carotid bulb. Vertebral arteries: Both vertebral arteries arise from the subclavian arteries. No proximal subclavian artery stenosis. Vertebral arteries patent within the neck without stenosis or evidence for dissection. Other: None IMPRESSION: MRI HEAD: 1.  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 Associated petechial hemorrhage without frank hemorrhagic transformation or significant regional mass effect. 2. Otherwise normal brain MRI for age. MRA HEAD: 1. Focal severe near occlusive/high-grade stenosis involving the mid basilar artery as above. Neuro interventional consultation suggested, as this patient may be a candidate for vascular stenting. 2. Otherwise negative intracranial MRA. No large vessel occlusion. No other hemodynamically significant or correctable stenosis. MRA NECK: Normal MRA of the neck. Electronically Signed   By: Rise Mu M.D.   On: 07/10/2021 03:21   ECHOCARDIOGRAM COMPLETE  Result Date: 07/10/2021    ECHOCARDIOGRAM REPORT   Patient Name:   Chris Shelton Date of Exam: 07/10/2021 Medical Rec #:  [id number]     Height:       69.0 in Accession #:     [id number]    Weight:       199.0 lb Date of Birth:  01-21-1961      BSA:          2.062 m Patient Age:    60 years      BP:           131/76 mmHg Patient Gender: M             HR:           57 bpm. Exam Location:  [facility] Procedure: 2D Echo, Cardiac Doppler and Color Doppler Indications:     Stroke I63.9  History:         Patient has no prior history of Echocardiogram examinations.                  Palpitations.  Sonographer:     Cristela Blue Referring Phys:  [id number] AMY N COX Diagnosing Phys: Debbe Odea MD  Sonographer Comments: Suboptimal apical window and suboptimal subcostal window. IMPRESSIONS  1. Left ventricular ejection fraction, by estimation, is 60 to 65%. The left ventricle has normal function. The left ventricle has no regional wall motion abnormalities. Left ventricular diastolic parameters are consistent with Grade I diastolic dysfunction (impaired relaxation).  2. Right ventricular systolic function is normal. The right ventricular size is normal.  3. The mitral valve is normal in structure. No evidence of mitral valve regurgitation.  4. The aortic valve is grossly normal. Aortic valve regurgitation is not visualized.  5. The inferior vena cava is normal in size with greater than 50% respiratory variability, suggesting right atrial pressure of 3 mmHg. FINDINGS  Left Ventricle: Left ventricular ejection fraction, by estimation, is 60 to 65%. The left ventricle has normal function. The left ventricle has no regional wall motion abnormalities. The left ventricular internal cavity size was normal in size. There is  no left ventricular hypertrophy. Left ventricular diastolic parameters are consistent with Grade I diastolic dysfunction (impaired relaxation). Right Ventricle: The right ventricular size is normal. No increase in right ventricular wall thickness. Right ventricular systolic function is normal. Left Atrium: Left atrial size was normal in size. Right Atrium: Right atrial size was normal in  size. Pericardium: There is no evidence of pericardial effusion. Mitral Valve: The mitral valve is normal in structure. No evidence of mitral valve regurgitation. MV peak gradient, 4.8 mmHg. The mean mitral valve gradient is 1.0 mmHg. Tricuspid Valve: The tricuspid valve is normal in structure. Tricuspid valve regurgitation is not demonstrated. Aortic Valve: The aortic valve is grossly normal. Aortic valve regurgitation is not visualized. Aortic valve mean gradient measures 3.0 mmHg. Aortic valve peak gradient measures 5.1  mmHg. Aortic valve area, by VTI measures 2.74 cm. Pulmonic Valve: The pulmonic valve was not well visualized. Pulmonic valve regurgitation is not visualized. Aorta: The aortic root is normal in size and structure. Venous: The inferior vena cava is normal in size with greater than 50% respiratory variability, suggesting right atrial pressure of 3 mmHg. IAS/Shunts: No atrial level shunt detected by color flow Doppler.  LEFT VENTRICLE PLAX 2D LVIDd:         4.50 cm   Diastology LVIDs:         2.50 cm   LV e' medial:    8.27 cm/s LV PW:         1.10 cm   LV E/e' medial:  7.0 LV IVS:        0.90 cm   LV e' lateral:   5.11 cm/s LVOT diam:     2.10 cm   LV E/e' lateral: 11.3 LV SV:         56 LV SV Index:   27 LVOT Area:     3.46 cm  RIGHT VENTRICLE RV S prime:     16.40 cm/s TAPSE (M-mode): 2.3 cm LEFT ATRIUM           Index        RIGHT ATRIUM           Index LA diam:      3.30 cm 1.60 cm/m   RA Area:     10.50 cm LA Vol (A2C): 17.2 ml 8.34 ml/m   RA Volume:   21.40 ml  10.38 ml/m LA Vol (A4C): 53.4 ml 25.90 ml/m  AORTIC VALVE                    PULMONIC VALVE AV Area (Vmax):    2.22 cm     PV Vmax:        1.02 m/s AV Area (Vmean):   2.13 cm     PV Vmean:       64.400 cm/s AV Area (VTI):     2.74 cm     PV VTI:         0.213 m AV Vmax:           113.00 cm/s  PV Peak grad:   4.2 mmHg AV Vmean:          77.900 cm/s  PV Mean grad:   2.0 mmHg AV VTI:            0.205 m      RVOT Peak grad: 4 mmHg  AV Peak Grad:      5.1 mmHg AV Mean Grad:      3.0 mmHg LVOT Vmax:         72.50 cm/s LVOT Vmean:        47.900 cm/s LVOT VTI:          0.162 m LVOT/AV VTI ratio: 0.79  AORTA Ao Root diam: 3.33 cm MITRAL VALVE               TRICUSPID VALVE MV Area (PHT): 3.46 cm    TR Peak grad:   12.1 mmHg MV Area VTI:   2.12 cm    TR Vmax:        174.00 cm/s MV Peak grad:  4.8 mmHg MV Mean grad:  1.0 mmHg    SHUNTS MV Vmax:       1.10 m/s    Systemic VTI:  0.16 m MV Vmean:  54.4 cm/s   Systemic Diam: 2.10 cm MV Decel Time: 219 msec    Pulmonic VTI:  0.245 m MV E velocity: 57.80 cm/s MV A velocity: 85.70 cm/s MV E/A ratio:  0.67 Debbe Odea MD Electronically signed by Debbe Odea MD Signature Date/Time: 07/10/2021/3:02:45 PM    Final    CT Orbits Wo Contrast  Result Date: 07/09/2021 CLINICAL DATA:  Vision loss right eye.  Symptoms started a week ago. EXAM: CT ORBITS WITHOUT CONTRAST TECHNIQUE: Multidetector CT imaging of the orbits was performed using the standard protocol without intravenous contrast. Multiplanar CT image reconstructions were also generated. RADIATION DOSE REDUCTION: This exam was performed according to the departmental dose-optimization program which includes automated exposure control, adjustment of the mA and/or kV according to patient size and/or use of iterative reconstruction technique. COMPARISON:  CT head 07/09/2021 FINDINGS: Orbits: No orbital mass or edema. Bilateral cataract extraction. Scleral banding on the left. Pituitary not enlarged. Cavernous sinus normal bilaterally. Visible paranasal sinuses: Mucosal edema paranasal sinuses. Small air-fluid level left maxillary sinus. Soft tissues: Negative for soft tissue mass or edema. Osseous: No fracture or skeletal lesion. IMPRESSION: No orbital lesion.  Prior orbital surgery. Sinus mucosal edema.  Air-fluid level left maxillary sinus. Electronically Signed   By: Marlan Palau M.D.   On: 07/09/2021 18:51    Microbiology: Results for  orders placed or performed during the hospital encounter of 07/09/21  Resp Panel by RT-PCR (Flu A&B, Covid) Nasopharyngeal Swab     Status: None   Collection Time: 07/10/21 12:57 AM   Specimen: Nasopharyngeal Swab; Nasopharyngeal(NP) swabs in vial transport medium  Result Value Ref Range Status   SARS Coronavirus 2 by RT PCR NEGATIVE NEGATIVE Final    Comment: (NOTE) SARS-CoV-2 target nucleic acids are NOT DETECTED.  The SARS-CoV-2 RNA is generally detectable in upper respiratory specimens during the acute phase of infection. The lowest concentration of SARS-CoV-2 viral copies this assay can detect is 138 copies/mL. A negative result does not preclude SARS-Cov-2 infection and should not be used as the sole basis for treatment or other patient management decisions. A negative result may occur with  improper specimen collection/handling, submission of specimen other than nasopharyngeal swab, presence of viral mutation(s) within the areas targeted by this assay, and inadequate number of viral copies(<138 copies/mL). A negative result must be combined with clinical observations, patient history, and epidemiological information. The expected result is Negative.  Fact Sheet for Patients:  BloggerCourse.com  Fact Sheet for Healthcare Providers:  SeriousBroker.it  This test is no t yet approved or cleared by the [place] FDA and  has been authorized for detection and/or diagnosis of SARS-CoV-2 by FDA under an Emergency Use Authorization (EUA). This EUA will remain  in effect (meaning this test can be used) for the duration of the COVID-19 declaration under Section 564(b)(1) of the Act, 21 U.S.C.section 360bbb-3(b)(1), unless the authorization is terminated  or revoked sooner.       Influenza A by PCR NEGATIVE NEGATIVE Final   Influenza B by PCR NEGATIVE NEGATIVE Final    Comment: (NOTE) The Xpert Xpress SARS-CoV-2/FLU/RSV plus  assay is intended as an aid in the diagnosis of influenza from Nasopharyngeal swab specimens and should not be used as a sole basis for treatment. Nasal washings and aspirates are unacceptable for Xpert Xpress SARS-CoV-2/FLU/RSV testing.  Fact Sheet for Patients: BloggerCourse.com  Fact Sheet for Healthcare Providers: SeriousBroker.it  This test is not yet approved or cleared by the [place] FDA and has  been authorized for detection and/or diagnosis of SARS-CoV-2 by FDA under an Emergency Use Authorization (EUA). This EUA will remain in effect (meaning this test can be used) for the duration of the COVID-19 declaration under Section 564(b)(1) of the Act, 21 U.S.C. section 360bbb-3(b)(1), unless the authorization is terminated or revoked.  Performed at [facility], [address] [place], [place] [address]     Labs: CBC: Recent Labs  Lab 07/10/21 0018 07/10/21 0623  WBC 7.1 6.9  NEUTROABS 4.3  --   HGB 14.6 14.6  HCT 43.0 42.3  MCV 85.7 84.6  PLT 234 211   Basic Metabolic Panel: Recent Labs  Lab 07/10/21 0018 07/10/21 0623  NA 133* 136  K 3.6 3.5  CL 101 102  CO2 27 27  GLUCOSE 104* 103*  BUN 21* 18  CREATININE 0.71 0.80  CALCIUM 8.9 9.0   Liver Function Tests: Recent Labs  Lab 07/10/21 0018  AST 17  ALT 14  ALKPHOS 61  BILITOT 0.8  PROT 7.3  ALBUMIN 4.1   CBG: Recent Labs  Lab 07/10/21 0201  GLUCAP 100*    Discharge time spent: greater than 30 minutes.  Signed: Delfino Lovett, MD [facility] 07/11/2021

## 2021-09-20 DIAGNOSIS — Z959 Presence of cardiac and vascular implant and graft, unspecified: Secondary | ICD-10-CM | POA: Diagnosis present

## 2021-09-21 HISTORY — PX: OTHER SURGICAL HISTORY: SHX169

## 2021-11-03 ENCOUNTER — Emergency Department: Payer: BC Managed Care – PPO

## 2021-11-03 ENCOUNTER — Other Ambulatory Visit: Payer: Self-pay

## 2021-11-03 ENCOUNTER — Observation Stay
Admission: EM | Admit: 2021-11-03 | Discharge: 2021-11-04 | Disposition: A | Discharge status: Home / Self Care | Payer: BC Managed Care – PPO | Attending: Internal Medicine | Admitting: Internal Medicine

## 2021-11-03 DIAGNOSIS — I639 Cerebral infarction, unspecified: Principal | ICD-10-CM | POA: Diagnosis present

## 2021-11-03 DIAGNOSIS — Z959 Presence of cardiac and vascular implant and graft, unspecified: Secondary | ICD-10-CM | POA: Diagnosis present

## 2021-11-03 DIAGNOSIS — Z7982 Long term (current) use of aspirin: Secondary | ICD-10-CM | POA: Diagnosis not present

## 2021-11-03 DIAGNOSIS — I651 Occlusion and stenosis of basilar artery: Secondary | ICD-10-CM | POA: Insufficient documentation

## 2021-11-03 DIAGNOSIS — G459 Transient cerebral ischemic attack, unspecified: Secondary | ICD-10-CM

## 2021-11-03 DIAGNOSIS — Z8673 Personal history of transient ischemic attack (TIA), and cerebral infarction without residual deficits: Secondary | ICD-10-CM

## 2021-11-03 DIAGNOSIS — Z79899 Other long term (current) drug therapy: Secondary | ICD-10-CM | POA: Diagnosis not present

## 2021-11-03 DIAGNOSIS — I10 Essential (primary) hypertension: Secondary | ICD-10-CM | POA: Diagnosis not present

## 2021-11-03 DIAGNOSIS — R202 Paresthesia of skin: Secondary | ICD-10-CM | POA: Diagnosis present

## 2021-11-03 LAB — URINALYSIS, ROUTINE W REFLEX MICROSCOPIC
Bilirubin Urine: NEGATIVE
Glucose, UA: NEGATIVE mg/dL
Hgb urine dipstick: NEGATIVE
Ketones, ur: NEGATIVE mg/dL
Leukocytes,Ua: NEGATIVE
Nitrite: NEGATIVE
Protein, ur: NEGATIVE mg/dL
Specific Gravity, Urine: 1.021 (ref 1.005–1.030)
pH: 6 (ref 5.0–8.0)

## 2021-11-03 LAB — BASIC METABOLIC PANEL
Anion gap: 7 (ref 5–15)
BUN: 16 mg/dL (ref 8–23)
CO2: 26 mmol/L (ref 22–32)
Calcium: 9.5 mg/dL (ref 8.9–10.3)
Chloride: 107 mmol/L (ref 98–111)
Creatinine, Ser: 0.89 mg/dL (ref 0.61–1.24)
GFR, Estimated: >60 mL/min (ref >60)
Glucose, Bld: 133 mg/dL — ABNORMAL HIGH (ref 70–99)
Potassium: 3.7 mmol/L (ref 3.5–5.1)
Sodium: 140 mmol/L (ref 135–145)

## 2021-11-03 LAB — CBC WITH DIFFERENTIAL/PLATELET
Abs Immature Granulocytes: 0.02 10*3/uL (ref 0.00–0.07)
Basophils Absolute: 0.1 10*3/uL (ref 0.0–0.1)
Basophils Relative: 1 %
Eosinophils Absolute: 0.2 10*3/uL (ref 0.0–0.5)
Eosinophils Relative: 2 %
HCT: 44.2 % (ref 39.0–52.0)
Hemoglobin: 14.8 g/dL (ref 13.0–17.0)
Immature Granulocytes: 0 %
Lymphocytes Relative: 17 %
Lymphs Abs: 1.3 10*3/uL (ref 0.7–4.0)
MCH: 28.8 pg (ref 26.0–34.0)
MCHC: 33.5 g/dL (ref 30.0–36.0)
MCV: 86.2 fL (ref 80.0–100.0)
Monocytes Absolute: 0.3 10*3/uL (ref 0.1–1.0)
Monocytes Relative: 5 %
Neutro Abs: 5.5 10*3/uL (ref 1.7–7.7)
Neutrophils Relative %: 75 %
Platelets: 210 10*3/uL (ref 150–400)
RBC: 5.13 MIL/uL (ref 4.22–5.81)
RDW: 12.1 % (ref 11.5–15.5)
WBC: 7.3 10*3/uL (ref 4.0–10.5)
nRBC: 0 % (ref 0.0–0.2)

## 2021-11-03 LAB — APTT: aPTT: 27 seconds (ref 24–36)

## 2021-11-03 LAB — URINE DRUG SCREEN, QUALITATIVE (ARMC ONLY)
Amphetamines, Ur Screen: NOT DETECTED
Barbiturates, Ur Screen: NOT DETECTED
Benzodiazepine, Ur Scrn: NOT DETECTED
Cannabinoid 50 Ng, Ur ~~LOC~~: NOT DETECTED
Cocaine Metabolite,Ur ~~LOC~~: NOT DETECTED
MDMA (Ecstasy)Ur Screen: NOT DETECTED
Methadone Scn, Ur: NOT DETECTED
Opiate, Ur Screen: NOT DETECTED
Phencyclidine (PCP) Ur S: NOT DETECTED
Tricyclic, Ur Screen: NOT DETECTED

## 2021-11-03 LAB — PROTIME-INR
INR: 1 (ref 0.8–1.2)
Prothrombin Time: 13.4 seconds (ref 11.4–15.2)

## 2021-11-03 LAB — CBG MONITORING, ED: Glucose-Capillary: 128 mg/dL — ABNORMAL HIGH (ref 70–99)

## 2021-11-03 IMAGING — MR MR HEAD W/O CM
12 series · 47 of 48 positions shown · non-contrast
Comparison: CT from earlier the same day and MRI from [DATE].

CLINICAL DATA: Initial evaluation for acute TIA.

EXAM:
MRI HEAD WITHOUT CONTRAST
TECHNIQUE: Multiplanar, multiecho pulse sequences of the brain and surrounding
structures were obtained without intravenous contrast.

[Series 5: ax dwi_tracew · axial · 3.0mm · 0.65mm/px · z∈[-80,+74]mm · 4 of 48 slices shown]
[im 1/48]
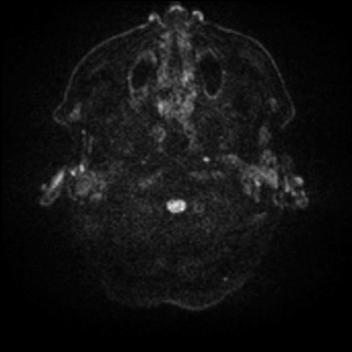
[im 16/48]
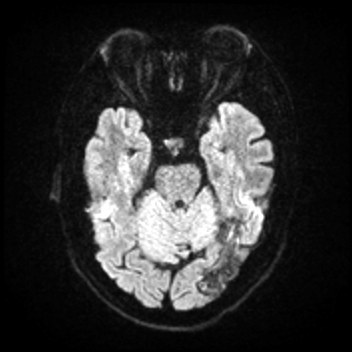
[im 32/48]
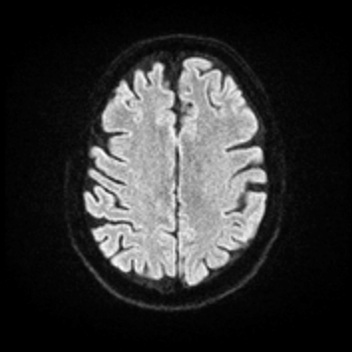
[im 48/48]
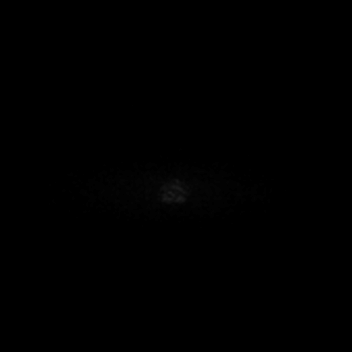

[Series 6: ax dwi_adc · axial · 3.0mm · 0.65mm/px · z∈[-80,+71]mm · 3 of 47 slices shown]
[im 1/47]
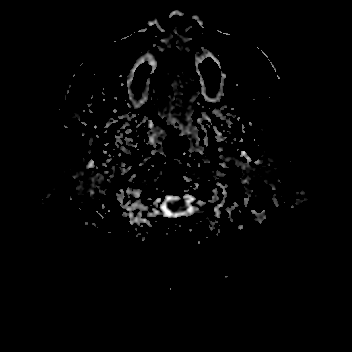
[im 24/47]
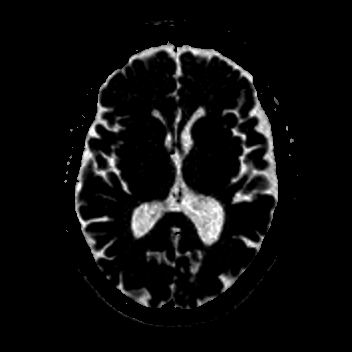
[im 47/47]
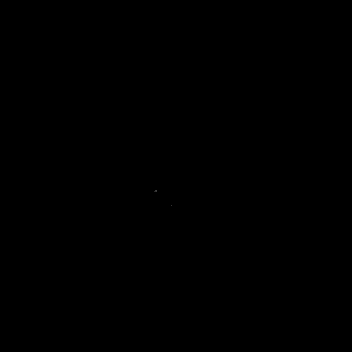

[Series 7: cor dwi_tracew · coronal · 5.0mm · 0.60mm/px · 3 of 38 slices shown]
[im 1/38]
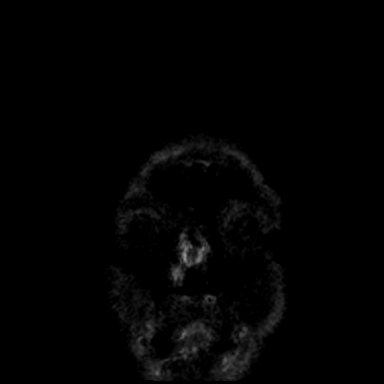
[im 19/38]
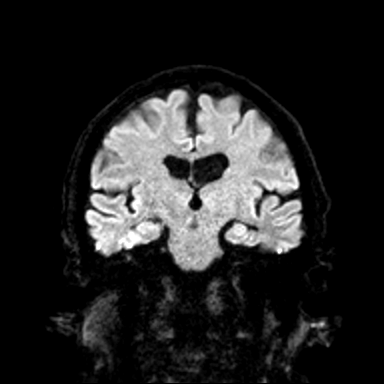
[im 38/38]
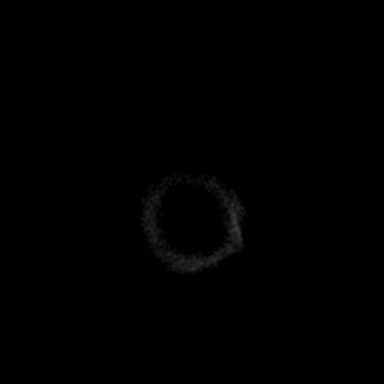

[Series 8: cor dwi_adc · coronal · 5.0mm · 0.60mm/px · 3 of 38 slices shown]
[im 1/38]
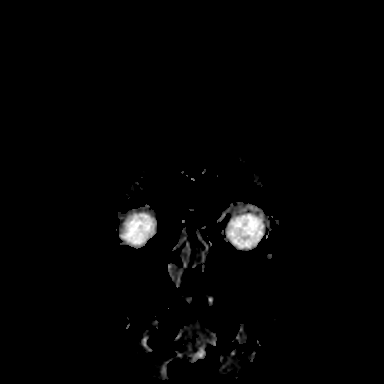
[im 19/38]
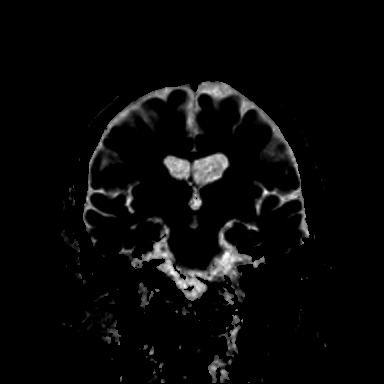
[im 38/38]
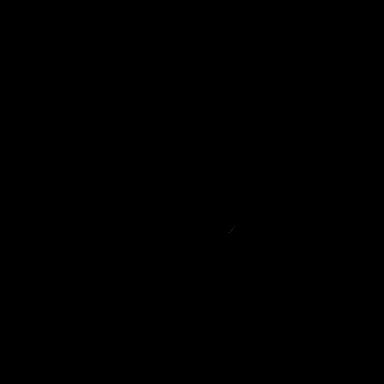

[Series 9: T1 · sagittal · 5.0mm · 0.62mm/px · 1 of 22 slices shown (1 of 2)]
[im 1/22]
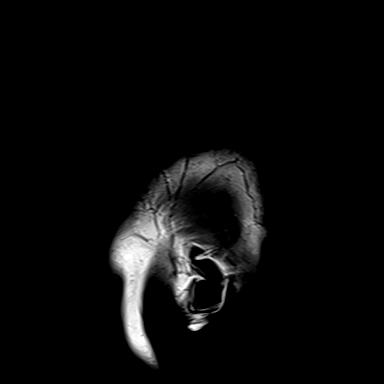

[Series 10: T2 · axial · 5.0mm · 0.53mm/px · z∈[-77,+72]mm · 2 of 26 slices shown (1 of 2)]
[im 1/26]
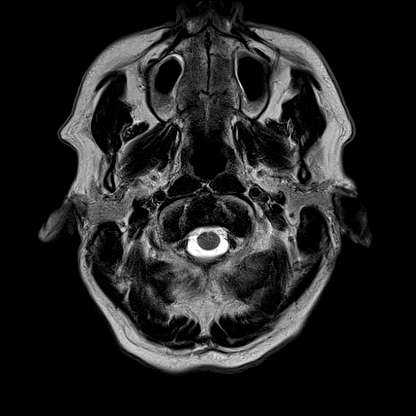
[im 26/26]
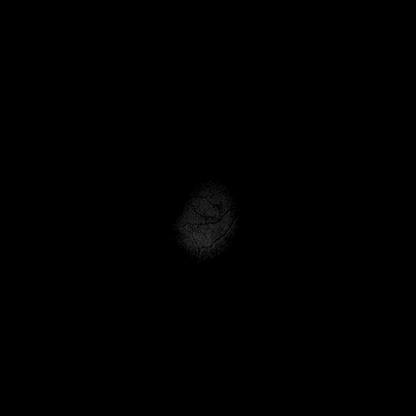

[Series 11: ax swi_mag · axial · 2.0mm · 0.90mm/px · z∈[-81,+76]mm · 5 of 80 slices shown]
[im 1/80]
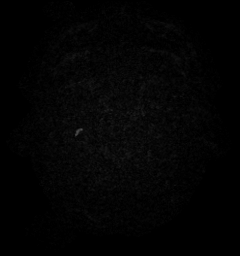
[im 20/80]
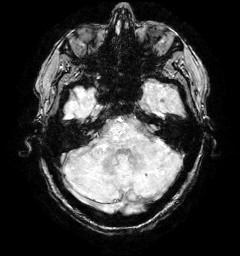
[im 40/80]
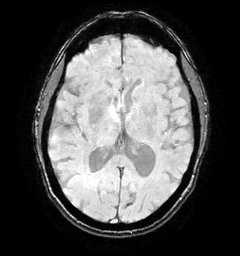
[im 60/80]
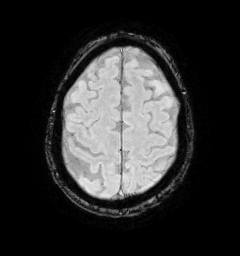
[im 80/80]
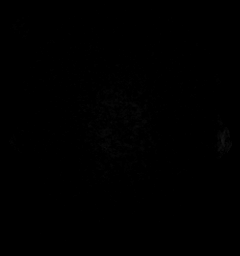

[Series 12: ax swi_pha · axial · 2.0mm · 0.90mm/px · z∈[-81,+76]mm · 5 of 80 slices shown]
[im 1/80]
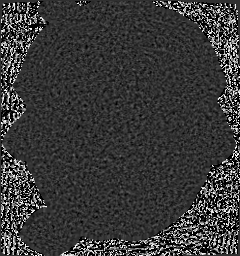
[im 20/80]
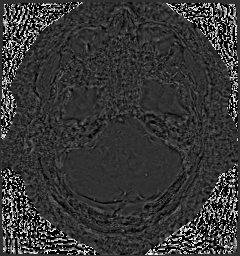
[im 40/80]
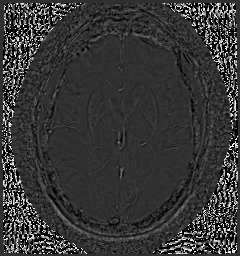
[im 60/80]
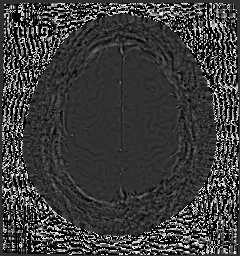
[im 80/80]
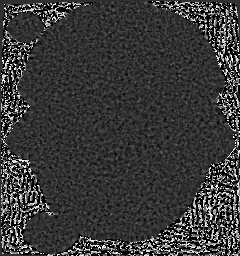

[Series 13: ax swi_swi · axial · 2.0mm · 0.90mm/px · z∈[-81,+76]mm · 5 of 80 slices shown]
[im 1/80]
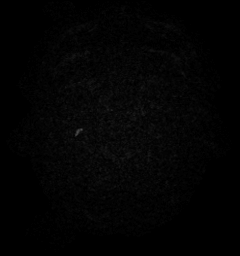
[im 20/80]
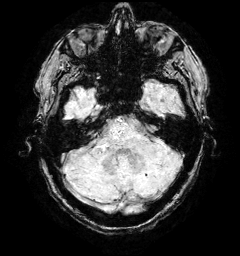
[im 40/80]
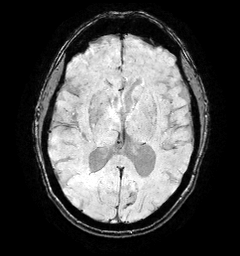
[im 60/80]
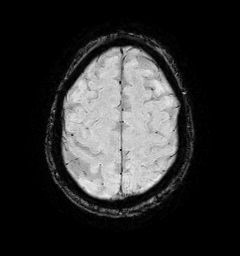
[im 80/80]
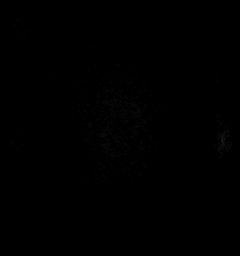

[Series 15: FLAIR · axial · 3.0mm · 0.53mm/px · z∈[-75,+71]mm · 3 of 50 slices shown]
[im 1/50]
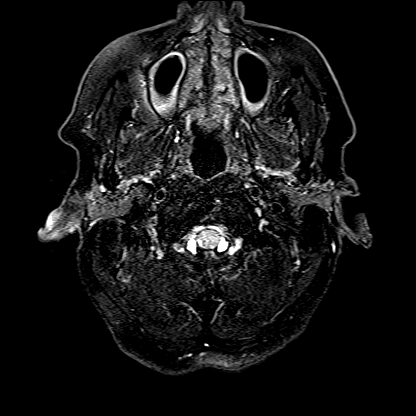
[im 25/50]
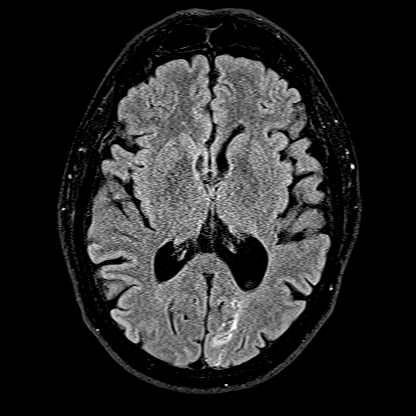
[im 50/50]
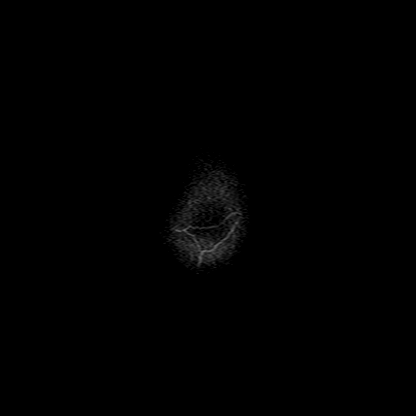

[Series 16: T1 · axial · 1.0mm · 0.98mm/px · z∈[-90,+83]mm · 11 of 175 slices shown (2 of 2)]
[im 1/175]
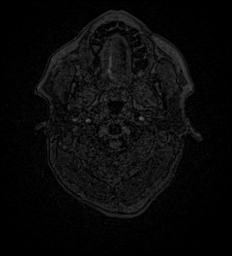
[im 16/175]
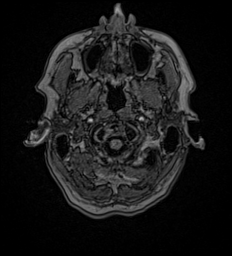
[im 32/175]
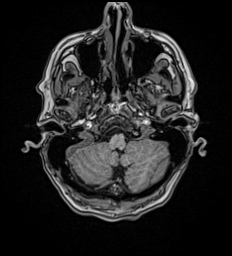
[im 48/175]
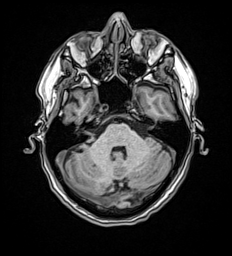
[im 64/175]
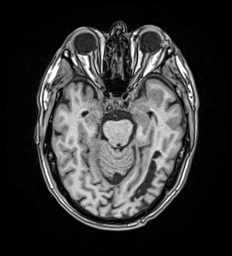
[im 80/175]
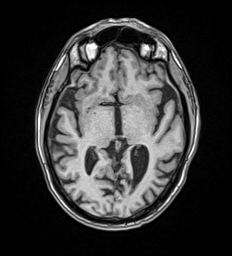
[im 95/175]
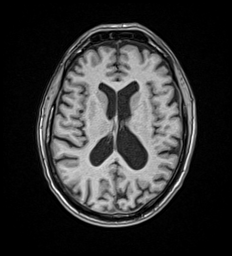
[im 111/175]
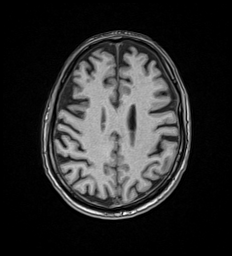
[im 127/175]
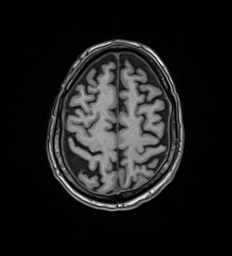
[im 143/175]
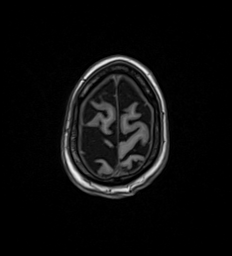
[im 175/175]
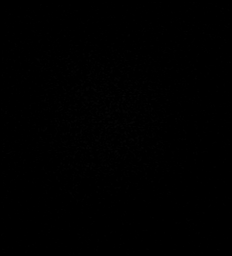

[Series 17: T2 · coronal · 5.0mm · 0.57mm/px · 2 of 30 slices shown (2 of 2)]
[im 1/30]
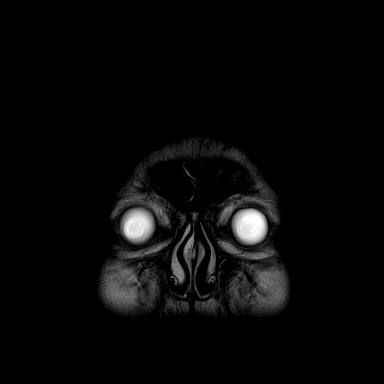
[im 30/30]
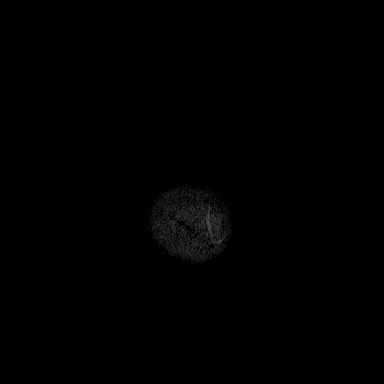

[47 of 48 positions shown; findings below may reference images not displayed]

FINDINGS: Brain: Cerebral volume stable, and remains within normal limits.
Chronic left PCA distribution infarct noted.

Patchy small volume acute ischemic infarcts seen involving the left
greater than right cerebellum. No associated hemorrhage or mass
effect. No other evidence for acute or subacute ischemia. Gray-white
matter differentiation otherwise maintained. Punctate chronic
microhemorrhage noted within the left cerebellum. No other acute or
chronic intracranial blood products.

No mass lesion, midline shift or mass effect. No hydrocephalus or
extra-axial fluid collection. Pituitary gland suprasellar region
within normal limits.

Vascular: Irregular flow void within the mid basilar artery,
consistent with previously identified critical stenosis. Major
intracranial vascular flow voids are otherwise maintained.

Skull and upper cervical spine: Craniocervical junction within
normal limits. Bone marrow signal intensity normal. No scalp soft
tissue abnormality.

Sinuses/Orbits: Postsurgical changes noted about the globes.
Paranasal sinuses are largely clear. No mastoid effusion.

Other: None.
IMPRESSION: 1. Patchy small volume acute ischemic nonhemorrhagic infarcts
involving the left greater than right cerebellum.
2. Chronic left PCA distribution infarct.
3. Irregular flow void within the mid basilar artery, consistent
with previously identified critical stenosis.

## 2021-11-03 MED ORDER — IOHEXOL 350 MG/ML SOLN
75.0000 mL | Freq: Once | INTRAVENOUS | Status: AC | PRN
  Administered 2021-11-03: 75 mL via INTRAVENOUS

## 2021-11-03 MED ORDER — ASPIRIN 81 MG PO CHEW
324.0000 mg | CHEWABLE_TABLET | Freq: Once | ORAL | Status: AC
Start: 2021-11-03 — End: 2021-11-03
  Administered 2021-11-03: 324 mg via ORAL
  Filled 2021-11-03: qty 4

## 2021-11-03 MED ORDER — CLOPIDOGREL BISULFATE 75 MG PO TABS
300.0000 mg | ORAL_TABLET | Freq: Once | ORAL | Status: AC
  Administered 2021-11-03: 300 mg via ORAL
  Filled 2021-11-03: qty 4

## 2021-11-03 NOTE — ED Triage Notes (Signed)
Pt to ED POV with wife for concerns of TIA or stoke. 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  ?1 hour ago pt had dizziness, then L hand was tingling, and L leg felt weak. States all symptoms have resolved.  ? ?Pt has Holter monitor on currently also.  ? ?CBG 128. ? ?Called CN, will room pt and let MD determine whether to call code stroke.  ?

## 2021-11-03 NOTE — ED Notes (Signed)
EDP at bedside examining pt. EDP will enter appropriate orders. Pt alert and oriented at this time.  ?

## 2021-11-03 NOTE — ED Notes (Signed)
Pt also states he has been off blood thinners for some time since last stroke. ?

## 2021-11-03 NOTE — ED Notes (Signed)
Called for Telespecialist stat consult ?

## 2021-11-03 NOTE — Consult Note (Signed)
Transient left arm and leg symptoms described to me in patient with severe basilar artery stenosis ?Jan 2023 stroke with full workup, s/p 90 days of DAPT but recently also has been missing his aspirin ? ?# TIA/stroke workup ?- HgbA1c, fasting lipid panel ?- MRI brain  ?- CTA head and neck ?- Frequent neuro checks ?- Echocardiogram does not need to be repeated at this time unless MRI shows c/f central embolic source or new cardiac symptoms ?- Aspirin - dose 325mg  PO or 300mg  PR, followed by 81 mg daily if no contraindications ?- Plavix 300 mg load with 75 mg daily for 21 - 90 day course if no contraindications ?- Risk factor modification ?- Telemetry monitoring ?- Blood pressure goal  ? - Permissive hypertension to 220/120 for now ?- No need for PT consult, OT consult, Speech consult, at this time given patient is back to baseline ?- Neurology (Dr. ) to follow ? ?

## 2021-11-03 NOTE — ED Provider Notes (Signed)
? ?[facility] ?Provider Note ? ? ? Event Date/Time  ? First MD Initiated Contact with Patient 11/03/21 1818   ?  (approximate) ? ? ?History  ? ?Chief Complaint ?strokelike symptoms (Resolved. Hx stroke X2) ? ? ?HPI ? ?Chris Shelton is a 61 y.o. male with past medical history of hypertension, sinus bradycardia, and stroke who presents to the ED complaining of weakness.  Patient reports that about 1 hour prior to arrival, around 5 PM, he had sudden onset of dizziness with feeling of unsteadiness on his feet.  He then developed numbness and tingling in his left hand followed by some weakness in his left leg.  He states the symptoms lasted for about 30 minutes before resolving on their own, now denies any dizziness, numbness, or weakness.  He states he has been otherwise feeling well today with no recent fevers, cough, chest pain, shortness of breath, nausea, vomiting, or dysuria.  He describes symptoms as similar to when he had a stroke back in January, had been prescribed aspirin and Plavix at that time.  He reports completing the course of Plavix and was supposed to remain on aspirin, but states he has not been taking it for a couple of weeks. ?  ? ? ?Physical Exam  ? ?Triage Vital Signs: ?ED Triage Vitals  ?Enc Vitals Group  ?   BP 11/03/21 1814 (!) 148/84  ?   Pulse Rate 11/03/21 1814 (!) 58  ?   Resp 11/03/21 1814 16  ?   Temp 11/03/21 1814 98.3 ?F (36.8 ?C)  ?   Temp Source 11/03/21 1814 Oral  ?   SpO2 11/03/21 1814 98 %  ?   Weight 11/03/21 1815 193 lb (87.5 kg)  ?   Height 11/03/21 1815 5\' 9"  (1.753 m)  ?   Head Circumference --   ?   Peak Flow --   ?   Pain Score 11/03/21 1814 0  ?   Pain Loc --   ?   Pain Edu? --   ?   Excl. in GC? --   ? ? ?Most recent vital signs: ?Vitals:  ? 11/03/21 2239 11/03/21 2300  ?BP: (!) 151/90 (!) 141/68  ?Pulse:  (!) 54  ?Resp:  16  ?Temp:    ?SpO2:  99%  ? ? ?Constitutional: Alert and oriented. ?Eyes: Conjunctivae are normal.  Pupils equal, round, and  reactive to light bilaterally.  Extraocular movements intact. ?Head: Atraumatic. ?Nose: No congestion/rhinnorhea. ?Mouth/Throat: Mucous membranes are moist.  ?Cardiovascular: Normal rate, regular rhythm. Grossly normal heart sounds.  2+ radial pulses bilaterally. ?Respiratory: Normal respiratory effort.  No retractions. Lungs CTAB. ?Gastrointestinal: Soft and nontender. No distention. ?Musculoskeletal: No lower extremity tenderness nor edema.  ?Neurologic:  Normal speech and language. No gross focal neurologic deficits are appreciated.  No dysmetria noted with finger-to-nose or heel-to-shin testing. ? ? ? ?ED Results / Procedures / Treatments  ? ?Labs ?(all labs ordered are listed, but only abnormal results are displayed) ?Labs Reviewed  ?BASIC METABOLIC PANEL - Abnormal; Notable for the following components:  ?    Result Value  ? Glucose, Bld 133 (*)   ? All other components within normal limits  ?URINALYSIS, ROUTINE W REFLEX MICROSCOPIC - Abnormal; Notable for the following components:  ? Color, Urine YELLOW (*)   ? APPearance CLEAR (*)   ? All other components within normal limits  ?CBG MONITORING, ED - Abnormal; Notable for the following components:  ? Glucose-Capillary 128 (*)   ?  All other components within normal limits  ?CBC WITH DIFFERENTIAL/PLATELET  ?PROTIME-INR  ?APTT  ?URINE DRUG SCREEN, QUALITATIVE ([facility] ONLY)  ?ETHANOL  ? ? ? ?EKG ? ?ED ECG REPORT ?Harriet Masson, the attending physician, personally viewed and interpreted this ECG. ? ? Date: 11/03/2021 ? EKG Time: 18:23 ? Rate: 63 ? Rhythm: normal sinus rhythm ? Axis: Normal ? Intervals:none ? ST&T Change: None ? ?RADIOLOGY ?CT head reviewed and interpreted by me with no hemorrhage or midline shift. ? ?PROCEDURES: ? ?Critical Care performed: No ? ?Procedures ? ? ?MEDICATIONS ORDERED IN ED: ?Medications  ?aspirin chewable tablet 324 mg (324 mg Oral Given 11/03/21 1939)  ?clopidogrel (PLAVIX) tablet 300 mg (300 mg Oral Given 11/03/21 1939)  ?iohexol  (OMNIPAQUE) 350 MG/ML injection 75 mL (75 mLs Intravenous Contrast Given 11/03/21 1915)  ? ? ? ?IMPRESSION / MDM / ASSESSMENT AND PLAN / ED COURSE  ?I reviewed the triage vital signs and the nursing notes. ?             ?               ? ?61 y.o. male with past medical history of hypertension, sinus bradycardia, and stroke who presents to the ED for sudden onset unsteadiness on his feet with numbness and tingling in his left hand along with weakness in his left leg 1 hour prior to arrival. ? ?Differential diagnosis includes, but is not limited to, stroke, TIA, intracranial hemorrhage, electrolyte abnormality, UTI, arrhythmia, complicated migraine. ? ?Patient well-appearing and in no acute distress, vital signs are unremarkable.  He states his symptoms have resolved and he has a nonfocal neurologic exam at this time.  Symptoms are concerning for TIA but I doubt stroke, we will further assess with CT head and hold off on code stroke activation unless patient's symptoms were to return.  Reviewing his admission from January, he had stroke related to near occlusion of his basilar artery.  Case discussed with Dr. Iver Nestle of neurology, who recommends proceeding with CTA of his head and neck if renal function is reassuring.  Plan to load with aspirin and Plavix following swallow screen if no hemorrhage noted on CT head. ? ?CT head is unremarkable, CTA redemonstrates known basilar artery stenosis but with no acute findings.  Labs are unremarkable with CBC showing no anemia or leukocytosis, BMP without electrolyte abnormality or AKI, INR within normal limits.  Patient was given loading doses of aspirin and Plavix and initial plan was for admission for further TIA work-up.  However, patient initially expressed desire to sign out AGAINST MEDICAL ADVICE but was willing to stay for MRI.  MRI subsequently came back positive for acute cerebellar stroke and in further discussion with patient, he is willing to stay for further  evaluation.  Case discussed with hospitalist for admission. ? ?  ? ? ?FINAL CLINICAL IMPRESSION(S) / ED DIAGNOSES  ? ?Final diagnoses:  ?Cerebrovascular accident (CVA), unspecified mechanism (HCC)  ?Basilar artery stenosis  ? ? ? ?Rx / DC Orders  ? ?ED Discharge Orders   ? ? None  ? ?  ? ? ? ?Note:  This document was prepared using Dragon voice recognition software and may include unintentional dictation errors. ?  ?Chesley Noon, MD ?11/03/21 2355 ? ?

## 2021-11-03 NOTE — ED Notes (Signed)
Full rainbow drawn and sent to lab.  

## 2021-11-04 ENCOUNTER — Other Ambulatory Visit: Payer: Self-pay

## 2021-11-04 DIAGNOSIS — I651 Occlusion and stenosis of basilar artery: Secondary | ICD-10-CM

## 2021-11-04 DIAGNOSIS — Z8673 Personal history of transient ischemic attack (TIA), and cerebral infarction without residual deficits: Secondary | ICD-10-CM | POA: Diagnosis not present

## 2021-11-04 DIAGNOSIS — I639 Cerebral infarction, unspecified: Secondary | ICD-10-CM

## 2021-11-04 LAB — LIPID PANEL
Cholesterol: 215 mg/dL — ABNORMAL HIGH (ref 0–200)
HDL: 43 mg/dL (ref >40)
LDL Cholesterol: 143 mg/dL — ABNORMAL HIGH (ref 0–99)
Total CHOL/HDL Ratio: 5 RATIO
Triglycerides: 146 mg/dL (ref <150)
VLDL: 29 mg/dL (ref 0–40)

## 2021-11-04 LAB — CREATININE, SERUM
Creatinine, Ser: 0.76 mg/dL (ref 0.61–1.24)
GFR, Estimated: >60 mL/min (ref >60)

## 2021-11-04 LAB — CBC
HCT: 43.3 % (ref 39.0–52.0)
Hemoglobin: 14.5 g/dL (ref 13.0–17.0)
MCH: 28.9 pg (ref 26.0–34.0)
MCHC: 33.5 g/dL (ref 30.0–36.0)
MCV: 86.4 fL (ref 80.0–100.0)
Platelets: 195 10*3/uL (ref 150–400)
RBC: 5.01 MIL/uL (ref 4.22–5.81)
RDW: 12.1 % (ref 11.5–15.5)
WBC: 6.1 10*3/uL (ref 4.0–10.5)
nRBC: 0 % (ref 0.0–0.2)

## 2021-11-04 LAB — ETHANOL: Alcohol, Ethyl (B): <10 mg/dL (ref <10)

## 2021-11-04 MED ORDER — ACETAMINOPHEN 325 MG RE SUPP: 650.0000 mg | RECTAL | Status: DC | PRN

## 2021-11-04 MED ORDER — ACETAMINOPHEN 325 MG PO TABS: 650.0000 mg | ORAL_TABLET | ORAL | Status: DC | PRN

## 2021-11-04 MED ORDER — CLOPIDOGREL BISULFATE 75 MG PO TABS
75.0000 mg | ORAL_TABLET | Freq: Every day | ORAL | Status: DC
  Administered 2021-11-04: 75 mg via ORAL
  Filled 2021-11-04: qty 1

## 2021-11-04 MED ORDER — ACETAMINOPHEN 160 MG/5ML PO SOLN
650.0000 mg | ORAL | Status: DC | PRN
  Filled 2021-11-04: qty 20.3

## 2021-11-04 MED ORDER — ASPIRIN 81 MG PO TBEC: 81.0000 mg | DELAYED_RELEASE_TABLET | Freq: Every day | ORAL | 0 refills | Status: AC

## 2021-11-04 MED ORDER — ATORVASTATIN CALCIUM 80 MG PO TABS: 80.0000 mg | ORAL_TABLET | Freq: Every day | ORAL | 0 refills | Status: DC

## 2021-11-04 MED ORDER — SODIUM CHLORIDE 0.9 % IV SOLN: INTRAVENOUS | Status: DC

## 2021-11-04 MED ORDER — CLOPIDOGREL BISULFATE 75 MG PO TABS: 75.0000 mg | ORAL_TABLET | Freq: Every day | ORAL | 0 refills | Status: AC

## 2021-11-04 MED ORDER — ASPIRIN EC 81 MG PO TBEC
81.0000 mg | DELAYED_RELEASE_TABLET | Freq: Every day | ORAL | Status: DC
  Administered 2021-11-04: 81 mg via ORAL
  Filled 2021-11-04: qty 1

## 2021-11-04 MED ORDER — ENOXAPARIN SODIUM 40 MG/0.4ML IJ SOSY
40.0000 mg | PREFILLED_SYRINGE | INTRAMUSCULAR | Status: DC
  Filled 2021-11-04: qty 0.4

## 2021-11-04 MED ORDER — ATORVASTATIN CALCIUM 20 MG PO TABS: 80.0000 mg | ORAL_TABLET | Freq: Every day | ORAL | Status: DC

## 2021-11-04 MED ORDER — STROKE: EARLY STAGES OF RECOVERY BOOK: Freq: Once | Status: DC

## 2021-11-04 NOTE — H&P (Signed)
?History and Physical  ? ? ?Patient: Chris Shelton [id number] DOB: 11/01/1960 ?DOA: 11/03/2021 ?DOS: the patient was seen and examined on 11/04/2021 ?PCP: Pcp, No  ?Patient coming from: Home ? ?Chief Complaint:  ?Chief Complaint  ?Patient presents with  ? strokelike symptoms  ?  Resolved. Hx stroke X2  ? ? ?HPI: Chris Shelton is a 61 y.o. 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  CT head was negative for acute stroke.  He was seen by teleneurology who recommended MRI which showed the following: ?IMPRESSION: ?1. Patchy small volume acute ischemic nonhemorrhagic infarcts ?involving the left greater than right cerebellum. ?2. Chronic left PCA distribution infarct. ?3. Irregular flow void within the mid basilar artery, consistent ?with previously identified critical stenosis ?Patient was treated with aspirin 324, a Plavix load of 300 and hospitalist consulted for admission. ? ?Additional ED course and data review: On arrival BP 148/84 with pulse 58 and otherwise normal vitals.  CBC and BMP unremarkable UDS and urinalysis unremarkable CT angio head and neck showed the following  ?IMPRESSION: ?1. No evidence of acute intracranial abnormality. ?2. Chronic left PCA infarct. ?3. 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 ?4. Widely patent cervical carotid and vertebral arteries. ? ?Review of Systems: As mentioned in the history of present illness. All other systems reviewed and are negative. ?Past Medical  History:  ?Diagnosis Date  ? Cataracts, bilateral 2019  ? Detached retina   ? Palpitations   ? Sinus trouble 2019  ? ?Past Surgical History:  ?Procedure Laterality Date  ? CATARACT EXTRACTION Right   ? CATARACT EXTRACTION W/PHACO Left 08/25/2017  ? Procedure: CATARACT EXTRACTION PHACO AND INTRAOCULAR LENS PLACEMENT (IOC);  Surgeon: Galen Manila, MD;  Location: [facility] ORS;  Service: Ophthalmology;  Laterality: Left;  [place]   00:49.9 ?AP%   17.5 ?CDE   8.75 ?Fluid Pack Lot # [id number]  ? EYE SURGERY    ? HAND SURGERY    ? multiple surgeries  ? RETINAL DETACHMENT SURGERY Bilateral   ? right x 2 and left x 2  ? ?Social History:  reports that he has never smoked. He has never used smokeless tobacco. He reports that he does not drink alcohol and does not use drugs. ? ?Allergies  ?Allergen Reactions  ? Penicillins Anaphylaxis  ? ? ?History reviewed. No pertinent family history. ? ?Prior to Admission medications   ?Medication Sig Start Date End Date Taking? Authorizing Provider  ?aspirin EC 81 MG EC tablet Take 1 tablet (81 mg total) by mouth daily. Swallow whole. 07/11/21   Delfino Lovett, MD  ?atorvastatin (LIPITOR) 80 MG tablet Take 1 tablet (80 mg total) by mouth at bedtime. 07/10/21 08/09/21  Delfino Lovett, MD  ?brimonidine-timolol (COMBIGAN) 0.2-0.5 % ophthalmic solution Place 1 drop into both eyes 2 (two) times daily. 05/27/21   [provider]  ?fluticasone (FLONASE) 50 MCG/ACT nasal spray Place 2 sprays into both nostrils every 12 (twelve) hours as needed. 06/26/21   [provider]  ? ? ?Physical Exam: ?Vitals:  ? 11/03/21 2030 11/03/21 2045 11/03/21 2239 11/03/21  2300  ?BP: 132/71  (!) 151/90 (!) 141/68  ?Pulse: (!) 49 (!) 56  (!) 54  ?Resp: 15 16  16   ?Temp:      ?TempSrc:      ?SpO2: 100% 98%  99%  ?Weight:      ?Height:      ? ?Physical Exam ?Vitals and nursing note reviewed.  ?Constitutional:   ?   General: He is not in acute distress. ?HENT:  ?   Head: Normocephalic and atraumatic.  ?Cardiovascular:  ?    Rate and Rhythm: Normal rate and regular rhythm.  ?   Heart sounds: Normal heart sounds.  ?Pulmonary:  ?   Effort: Pulmonary effort is normal.  ?   Breath sounds: Normal breath sounds.  ?Abdominal:  ?   Palpations: Abdomen is soft.  ?   Tenderness: There is no abdominal tenderness.  ?Neurological:  ?   General: No focal deficit present.  ?   Mental Status: Mental status is at baseline.  ? ? ? ?Data Reviewed: ?Relevant notes from primary care and specialist visits, past discharge summaries as available in EHR, including Care Everywhere. ?Prior diagnostic testing as pertinent to current admission diagnoses ?Updated medications and problem lists for reconciliation ?ED course, including vitals, labs, imaging, treatment and response to treatment ?Triage notes, nursing and pharmacy notes and ED provider's notes ?Notable results as noted in HPI ? ? ?Assessment and Plan: ?* Cerebellar stroke, acute (HCC) ?Seen by neurologist Dr. with the following recommendations: ?- HgbA1c, fasting lipid panel ?- MRI brain  ?- CTA head and neck ?- Frequent neuro checks ?- Echocardiogram does not need to be repeated at this time unless MRI shows c/f central embolic source or new cardiac symptoms ?- Aspirin - dose 325mg  PO or 300mg  PR, followed by 81 mg daily if no contraindications ?- Plavix 300 mg load with 75 mg daily for 21 - 90 day course if no contraindications ?- Risk factor modification ?- Telemetry monitoring ?- Blood pressure goal  ?            - Permissive hypertension to 220/120 for now ?- No need for PT consult, OT consult, Speech consult, at this time given patient is back to baseline ?- Neurology (Dr. Iver Nestle) to follow ? ?Left PCA stroke 06/2021.  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 Amada Jupiter for  consideration of a basilar stent which "would likely be done only if he fails medical management" ? ?Cardiac loop recorder in situ 09/20/21 ?Recent recording on 5/11 showed the following: ?10/31/2021 9:08 PM EDT   ?This result has an attachment that is not available.   ?Physician Comments: AF episodes consistent with SR with PVCs   ? ?-Can consider cardiology consult ?Continuous cardiac monitoring ? ?Essential hypertension ?Hold home antihypertensives to allow for permissive hypertension ? ? ? ? ?Advance Care Planning:   Code Status: Prior  ? ?Consults: Neurology, Dr. 09/22/21 ? ?Family Communication: none ? ?Severity of Illness: ?The appropriate patient status for this patient is OBSERVATION. Observation status is judged to be reasonable and necessary in order to provide the required intensity of service to ensure the patient's safety. The patient's presenting symptoms, physical exam findings, and initial radiographic and laboratory data in the context of their medical condition is felt to place them at decreased risk for further  clinical deterioration. Furthermore, it is anticipated that the patient will be medically stable for discharge from the hospital within 2 midnights of admission.  ? ?Author: ?Andris BaumannHazel V Arville Postlewaite, MD ?11/04/2021 12:08 AM ? ?For on call review [email or web address].  ?

## 2021-11-04 NOTE — ED Notes (Signed)
Dr. Sherryll Burger at bedside assessing pt at this time.  ?

## 2021-11-04 NOTE — Assessment & Plan Note (Signed)
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 Charlesetta Garibaldi for consideration of a basilar stent which "would likely be done only if he fails medical management" ?

## 2021-11-04 NOTE — Assessment & Plan Note (Signed)
Recent recording on 5/11 showed the following: ?10/31/2021 9:08 PM EDT?  ?This result has an attachment that is not available.?  ?Physician Comments: AF episodes consistent with SR with PVCs?  ? ?-Can consider cardiology consult ?Continuous cardiac monitoring ?

## 2021-11-04 NOTE — Assessment & Plan Note (Signed)
-   Hold home antihypertensives to allow for permissive hypertension 

## 2021-11-04 NOTE — Progress Notes (Signed)
PT Cancellation Note ? ?Patient Details ?Name: Chris Shelton ?MRN: [id number] ?DOB: May 09, 1961 ? ? ?Cancelled Treatment:    Reason Eval/Treat Not Completed: Other (comment) Duplicate PT orders. Will complete per MD. ? ?Aleda Grana, PT, DPT ?11/04/21, 12:53 PM ? ? ?Sandi Mariscal ?11/04/2021, 12:53 PM ?

## 2021-11-04 NOTE — Progress Notes (Signed)
SLP Cancellation Note ? ?Patient Details ?Name: Chris Shelton ?MRN: [id number] ?DOB: 1960-12-15 ? ? ?Cancelled treatment:       Reason Eval/Treat Not Completed: SLP screened, no needs identified, will sign off (chart reviewed; met w/ pt in room, NSG arrived.) ?Pt denied any difficulty swallowing and is currently on a regular diet; tolerates swallowing pills w/ water per NSG. Pt conversed in conversation w/out any overt expressive/receptive deficits noted; pt denied any speech-language deficits. Speech clear in back and forth conversation re: his status. Pt stated he was back to his Baseline w/ no deficits. ?No further skilled ST services indicated as pt appears at his baseline. Pt agreed. NSG to reconsult if any change in status while admitted.   ? ? ? ? ?Orinda Kenner, MS, CCC-SLP ?Speech Language Pathologist ?[facility]; [place] ?[phone] (ascom) ?Coti Burd ?11/04/2021, 11:43 AM ?

## 2021-11-04 NOTE — Progress Notes (Signed)
OT Cancellation Note ? ?Patient Details ?Name: Chris Shelton ?MRN: [id number] ?DOB: 18-Feb-1961 ? ? ?Cancelled Treatment:    Reason Eval/Treat Not Completed: OT screened, no needs identified, will sign off. OT orders received, chart reviewed. Per neurology note pt is back to baseline & no need for OT consult; attending & nurse confirm this. OT to complete current orders. Please re-consult if new needs arise. ? ?Arman Filter., MPH, MS, OTR/L ?ascom [phone] ?11/04/21, 9:48 AM ?

## 2021-11-04 NOTE — Consult Note (Signed)
Neurology Consultation ?Reason for Consult: Stroke ?Referring Physician: Karlene Lineman ? ?CC: Unsteadiness ? ?History is obtained from: Patient ? ?HPI: Chris Shelton is a 61 y.o. male with a history of previous stroke who is on aspirin Plavix and atorvastatin due to basilar stenosis who presents with recurrence of disequilibrium.  He states that his symptoms have essentially resolved at this point.  Yesterday, he felt off balance, and therefore sought care in the emergency department. ? ?Due to the symptoms, he was admitted for evaluation.  He had been on Plavix as well as aspirin and atorvastatin, but ran out of the atorvastatin and Plavix.  He has only been taking the aspirin intermittently. ? ? ?LKW: 5/14, 5pm ?tpa given?: no, resolution of symptoms ? ? ?ROS: A 14 point ROS was performed and is negative except as noted in the HPI.  ?Past Medical History:  ?Diagnosis Date  ? Cataracts, bilateral 2019  ? Detached retina   ? Palpitations   ? Sinus trouble 2019  ? ? ? ?History reviewed. No pertinent family history. ? ? ?Social History:  reports that he has never smoked. He has never used smokeless tobacco. He reports that he does not drink alcohol and does not use drugs. ? ? ?Exam: ?Current vital signs: ?BP 132/67   Pulse (!) 53   Temp 98.3 ?F (36.8 ?C) (Oral)   Resp 16   Ht 5\' 9"  (1.753 m)   Wt 87.5 kg   SpO2 98%   BMI 28.50 kg/m?  ?Vital signs in last 24 hours: ?Temp:  [98.3 ?F (36.8 ?C)] 98.3 ?F (36.8 ?C) (05/14 1814) ?Pulse Rate:  [45-60] 53 (05/15 0800) ?Resp:  [15-17] 16 (05/15 0800) ?BP: (123-151)/(64-90) 132/67 (05/15 0800) ?SpO2:  [98 %-100 %] 98 % (05/15 0800) ?Weight:  [87.5 kg] 87.5 kg (05/14 1815) ? ? ?Physical Exam  ?Constitutional: Appears well-developed and well-nourished.  ?Psych: Affect appropriate to situation ?Eyes: No scleral injection ?HENT: No OP obstruction ?MSK: no joint deformities.  ?Cardiovascular: Normal rate and regular rhythm.  ?Respiratory: Effort normal, non-labored breathing ?GI:  Soft.  No distension. There is no tenderness.  ?Skin: WDI ? ?Neuro: ?Mental Status: ?Patient is awake, alert, oriented to person, place, month, year, and situation. ?Patient is able to give a clear and coherent history. ?No signs of aphasia or neglect ?Cranial Nerves: ?II: Visual Fields are full. Pupils are equal, round, and reactive to light.   ?III,IV, VI: EOMI without ptosis or diploplia.  ?V: Facial sensation is symmetric to temperature ?VII: Facial movement is symmetric.  ?VIII: hearing is intact to voice ?X: Uvula elevates symmetrically ?XI: Shoulder shrug is symmetric. ?XII: tongue is midline without atrophy or fasciculations.  ?Motor: ?Tone is normal. Bulk is normal. 5/5 strength was present in all four extremities.  ?Sensory: ?Sensation is symmetric to light touch and temperature in the arms and legs. ?Cerebellar: ?FNF and HKS are intact bilaterally ? ? ? ?I have reviewed labs in epic and the results pertinent to this consultation are: ?LDL 143 ? ? ?I have reviewed the images obtained: MRI brain - bilateral cerebellar storkes ? ?Impression: 61 year old male with bilateral cerebellar strokes, felt to be artery to artery in the setting of basilar stenosis.  He has been noncompliant with his medications, therefore I do not think we can consider this a failure of medical therapy.  In the setting of basilar stenosis, I would not favor any type of intervention unless he had failed medical therapy. ? ?Recommendations: ?1) ASA 81 mg daily,  Plavix 75 mg daily for at least 90 days ?2) atorvastatin 80 mg daily, with goal LDL less than 70 indefinitely ?3) working on goal of normotension with PCP. ?4) neurology will be available as needed. ? ?Ritta Slot, MD ?[facility] ?[phone] ? ?If 7pm- 7am, please page neurology on call as listed in [facility]. ? ?

## 2021-11-04 NOTE — Assessment & Plan Note (Signed)
Seen by neurologist Dr. Iver Nestle with the following recommendations: ?- HgbA1c, fasting lipid panel ?- MRI brain  ?- CTA head and neck ?- Frequent neuro checks ?- Echocardiogram does not need to be repeated at this time unless MRI shows c/f central embolic source or new cardiac symptoms ?- Aspirin - dose 325mg  PO or 300mg  PR, followed by 81 mg daily if no contraindications ?- Plavix 300 mg load with 75 mg daily for 21 - 90 day course if no contraindications ?- Risk factor modification ?- Telemetry monitoring ?- Blood pressure goal  ?            - Permissive hypertension to 220/120 for now ?- No need for PT consult, OT consult, Speech consult, at this time given patient is back to baseline ?- Neurology (Dr. ) to follow ?

## 2021-11-04 NOTE — Progress Notes (Signed)
PT Cancellation Note ? ?Patient Details ?Name: Chris Shelton ?MRN: [id number] ?DOB: 24-Nov-1960 ? ? ?Cancelled Treatment:    Reason Eval/Treat Not Completed: PT screened, no needs identified, will sign off PT orders received, chart reviewed. Per neurology note pt is back to baseline & no need for PT consult; attending & nurse confirm this. PT to complete current orders. Please re-consult if new needs arise. ? ?Aleda Grana, PT, DPT ?11/04/21, 9:02 AM ? ? ?Sandi Mariscal ?11/04/2021, 9:02 AM ?

## 2022-09-03 ENCOUNTER — Encounter: Payer: Self-pay | Admitting: Gastroenterology

## 2022-09-03 NOTE — H&P (Signed)
Pre-Procedure H&P   Patient ID: Chris Shelton is a 62 y.o. male.  Gastroenterology Provider: [name], DO  Referring Provider: Dawson Bills, NP PCP: Merryl Hacker, No  Date: 09/04/2022  HPI Chris Shelton is a 62 y.o. male who presents today for Colonoscopy for Initial screening colonoscopy .  Undergoing initial screening colonoscopy.  Mother with history of colon polyps.  No other family history of polyps or colon cancer.  The patient reports mild constipation without melena or hematochezia.  He is currently on Plavix was been held for this procedure  (last dose 5 days ago)  Had TIA in 04/2021 January 2023 and cerebrovascular accident 10/2021  Creatinine 0.9 hemoglobin 14.2 MCV 91 platelets 1-87,000   Past Medical History:  Diagnosis Date   Cataracts, bilateral 2019   Detached retina    Palpitations    Sinus trouble 2019   Stroke [facility])    TIA (transient ischemic attack)     Past Surgical History:  Procedure Laterality Date   CATARACT EXTRACTION Right    CATARACT EXTRACTION W/PHACO Left 08/25/2017   Procedure: CATARACT EXTRACTION PHACO AND INTRAOCULAR LENS PLACEMENT ([place]);  Surgeon: Birder Robson, MD;  Location: [facility] ORS;  Service: Ophthalmology;  Laterality: Left;  [place]   00:49.9 AP%   17.5 CDE   8.75 Fluid Pack Lot # [id number]   EYE SURGERY     HAND SURGERY     multiple surgeries   heart monitor insertion  09/2021   RETINAL DETACHMENT SURGERY Bilateral    right x 2 and left x 2    Family History Mother- colon polyps No h/o GI disease or malignancy  Review of Systems  Constitutional:  Negative for activity change, appetite change, chills, diaphoresis, fatigue, fever and unexpected weight change.  HENT:  Negative for trouble swallowing and voice change.   Respiratory:  Negative for shortness of breath and wheezing.   Cardiovascular:  Negative for chest pain, palpitations and leg swelling.  Gastrointestinal:  Positive for constipation. Negative for  abdominal distention, abdominal pain, anal bleeding, blood in stool, diarrhea, nausea and vomiting.  Musculoskeletal:  Negative for arthralgias and myalgias.  Skin:  Negative for color change and pallor.  Neurological:  Negative for dizziness, syncope and weakness.  Psychiatric/Behavioral:  Negative for confusion. The patient is not nervous/anxious.   All other systems reviewed and are negative.    Medications No current facility-administered medications on file prior to encounter.   Current Outpatient Medications on File Prior to Encounter  Medication Sig Dispense Refill   aspirin 81 MG chewable tablet Chew by mouth daily.     atorvastatin (LIPITOR) 80 MG tablet Take 1 tablet (80 mg total) by mouth at bedtime. 30 tablet 0   cholecalciferol (VITAMIN D3) 25 MCG (1000 UNIT) tablet Take 1,000 Units by mouth daily as needed.     Coenzyme Q10 (CO Q 10 PO) Take 1 capsule by mouth daily as needed.     Multiple Vitamins-Minerals (MULTIVITAMIN ADULT) CHEW Chew 1 tablet by mouth daily as needed.     Omega-3 Fatty Acids (FISH OIL PO) Take 1 capsule by mouth daily as needed.     brimonidine-timolol (COMBIGAN) 0.2-0.5 % ophthalmic solution Place 1 drop into both eyes 2 (two) times daily.     fluticasone (FLONASE) 50 MCG/ACT nasal spray Place 2 sprays into both nostrils every 12 (twelve) hours as needed.     ibuprofen (ADVIL) 200 MG tablet Take 400 mg by mouth every 6 (six) hours as needed  for moderate pain.      Pertinent medications related to GI and procedure were reviewed by me with the patient prior to the procedure   Current Facility-Administered Medications:    0.9 %  sodium chloride infusion, , Intravenous, Continuous, [name], DO, Last Rate: 20 mL/hr at 09/04/22 1301, Continued from Pre-op at 09/04/22 1301      Allergies  Allergen Reactions   Penicillin G     Other reaction(s): Other (See Comments) Pt states that "I almost died"; received it as an infant   Penicillins  Anaphylaxis   Allergies were reviewed by me prior to the procedure  Objective   Body mass index is 25.91 kg/m. Vitals:   09/04/22 1233  BP: (!) 143/60  Pulse: (!) 57  Resp: 17  Temp: (!) 96.3 F (35.7 C)  TempSrc: Temporal  SpO2: 100%  Weight: 80.7 kg  Height: 5' 9.5" (1.765 m)     Physical Exam Vitals and nursing note reviewed.  Constitutional:      General: He is not in acute distress.    Appearance: Normal appearance. He is not ill-appearing, toxic-appearing or diaphoretic.  HENT:     Head: Normocephalic and atraumatic.     Nose: Nose normal.     Mouth/Throat:     Mouth: Mucous membranes are moist.     Pharynx: Oropharynx is clear.  Eyes:     General: No scleral icterus.    Extraocular Movements: Extraocular movements intact.  Cardiovascular:     Rate and Rhythm: Regular rhythm. Bradycardia present.     Heart sounds: Normal heart sounds. No murmur heard.    No friction rub. No gallop.  Pulmonary:     Effort: Pulmonary effort is normal. No respiratory distress.     Breath sounds: Normal breath sounds. No wheezing, rhonchi or rales.  Abdominal:     General: Abdomen is flat. Bowel sounds are normal. There is no distension.     Palpations: Abdomen is soft.     Tenderness: There is no abdominal tenderness. There is no guarding or rebound.  Musculoskeletal:     Cervical back: Neck supple.     Right lower leg: No edema.     Left lower leg: No edema.  Skin:    General: Skin is warm and dry.     Coloration: Skin is not jaundiced or pale.  Neurological:     Mental Status: He is alert and oriented to person, place, and time. Mental status is at baseline.  Psychiatric:        Mood and Affect: Mood normal.        Behavior: Behavior normal.        Thought Content: Thought content normal.        Judgment: Judgment normal.      Assessment:  Chris Shelton is a 62 y.o. male  who presents today for Colonoscopy for Initial screening colonoscopy .  Plan:   Colonoscopy with possible intervention today  Colonoscopy with possible biopsy, control of bleeding, polypectomy, and interventions as necessary has been discussed with the patient/patient representative. Informed consent was obtained from the patient/patient representative after explaining the indication, nature, and risks of the procedure including but not limited to death, bleeding, perforation, missed neoplasm/lesions, cardiorespiratory compromise, and reaction to medications. Opportunity for questions was given and appropriate answers were provided. Patient/patient representative has verbalized understanding is amenable to undergoing the procedure.   [name], DO  [facility]  Portions of the  record may have been created with voice recognition software. Occasional wrong-word or 'sound-a-like' substitutions may have occurred due to the inherent limitations of voice recognition software.  Read the chart carefully and recognize, using context, where substitutions may have occurred.

## 2022-09-04 ENCOUNTER — Ambulatory Visit
Admission: RE | Admit: 2022-09-04 | Discharge: 2022-09-04 | Disposition: A | Discharge status: Home / Self Care | Payer: BC Managed Care – PPO | Source: Ambulatory Visit | Attending: Gastroenterology | Admitting: Gastroenterology

## 2022-09-04 ENCOUNTER — Ambulatory Visit: Payer: BC Managed Care – PPO | Admitting: Anesthesiology

## 2022-09-04 ENCOUNTER — Encounter
Admission: RE | Disposition: A | Discharge status: Home / Self Care | Payer: Self-pay | Source: Ambulatory Visit | Attending: Gastroenterology

## 2022-09-04 ENCOUNTER — Encounter: Payer: Self-pay | Admitting: Gastroenterology

## 2022-09-04 DIAGNOSIS — Z1211 Encounter for screening for malignant neoplasm of colon: Secondary | ICD-10-CM | POA: Insufficient documentation

## 2022-09-04 DIAGNOSIS — Z83719 Family history of colon polyps, unspecified: Secondary | ICD-10-CM | POA: Insufficient documentation

## 2022-09-04 DIAGNOSIS — Z7902 Long term (current) use of antithrombotics/antiplatelets: Secondary | ICD-10-CM | POA: Insufficient documentation

## 2022-09-04 DIAGNOSIS — K5730 Diverticulosis of large intestine without perforation or abscess without bleeding: Secondary | ICD-10-CM | POA: Diagnosis not present

## 2022-09-04 DIAGNOSIS — D122 Benign neoplasm of ascending colon: Secondary | ICD-10-CM | POA: Insufficient documentation

## 2022-09-04 DIAGNOSIS — K640 First degree hemorrhoids: Secondary | ICD-10-CM | POA: Diagnosis not present

## 2022-09-04 DIAGNOSIS — K5900 Constipation, unspecified: Secondary | ICD-10-CM | POA: Diagnosis not present

## 2022-09-04 HISTORY — DX: Cerebral infarction, unspecified: I63.9

## 2022-09-04 HISTORY — PX: COLONOSCOPY: SHX5424

## 2022-09-04 HISTORY — DX: Transient cerebral ischemic attack, unspecified: G45.9

## 2022-09-04 SURGERY — COLONOSCOPY
Anesthesia: General

## 2022-09-04 MED ORDER — PROPOFOL 10 MG/ML IV BOLUS
INTRAVENOUS | Status: DC | PRN
Start: 2022-09-04 — End: 2022-09-04
  Administered 2022-09-04: 50 mg via INTRAVENOUS

## 2022-09-04 MED ORDER — STERILE WATER FOR IRRIGATION IR SOLN
Status: DC | PRN
  Administered 2022-09-04: 120 mL

## 2022-09-04 MED ORDER — SODIUM CHLORIDE 0.9 % IV SOLN: INTRAVENOUS | Status: DC

## 2022-09-04 MED ORDER — LIDOCAINE HCL (PF) 2 % IJ SOLN
INTRAMUSCULAR | Status: AC
  Filled 2022-09-04: qty 5

## 2022-09-04 MED ORDER — DEXMEDETOMIDINE HCL IN NACL 200 MCG/50ML IV SOLN
INTRAVENOUS | Status: DC | PRN
  Administered 2022-09-04: 8 ug via INTRAVENOUS
  Administered 2022-09-04: 12 ug via INTRAVENOUS

## 2022-09-04 MED ORDER — PROPOFOL 500 MG/50ML IV EMUL
INTRAVENOUS | Status: DC | PRN
  Administered 2022-09-04: 100 ug/kg/min via INTRAVENOUS

## 2022-09-04 MED ORDER — LIDOCAINE HCL (CARDIAC) PF 100 MG/5ML IV SOSY
PREFILLED_SYRINGE | INTRAVENOUS | Status: DC | PRN
  Administered 2022-09-04: 60 mg via INTRAVENOUS

## 2022-09-04 NOTE — Anesthesia Preprocedure Evaluation (Addendum)
Anesthesia Evaluation  Patient identified by MRN, date of birth, ID band Patient awake    Reviewed: Allergy & Precautions, H&P , NPO status , Patient's Chart, lab work & pertinent test results  Airway Mallampati: II  TM Distance: >3 FB Neck ROM: full    Dental  (+) Missing   Pulmonary neg pulmonary ROS   Pulmonary exam normal        Cardiovascular negative cardio ROS Normal cardiovascular exam  ECHO 1/23: 1. Left ventricular ejection fraction, by estimation, is 60 to 65%. The  left ventricle has normal function. The left ventricle has no regional  wall motion abnormalities. Left ventricular diastolic parameters are  consistent with Grade I diastolic  dysfunction (impaired relaxation).   2. Right ventricular systolic function is normal. The right ventricular  size is normal.   3. The mitral valve is normal in structure. No evidence of mitral valve  regurgitation.   4. The aortic valve is grossly normal. Aortic valve regurgitation is not  visualized.   5. The inferior vena cava is normal in size with greater than 50%  respiratory variability, suggesting right atrial pressure of 3 mmHg.     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 CVA  negative psych ROS   GI/Hepatic negative GI ROS, Neg liver ROS,,,  Endo/Other  negative endocrine ROS    Renal/GU negative Renal ROS  negative genitourinary   Musculoskeletal   Abdominal Normal abdominal exam  (+)   Peds  Hematology negative hematology ROS (+)   Anesthesia Other Findings Past Medical History: 2019: Cataracts, bilateral No date: Detached retina No date: Palpitations 2019: Sinus trouble  Past Surgical History: No date: CATARACT EXTRACTION; Right 08/25/2017: CATARACT EXTRACTION W/PHACO; Left     Comment:  Procedure: CATARACT EXTRACTION PHACO AND INTRAOCULAR               LENS PLACEMENT (IOC);  Surgeon:  Birder Robson, MD;                Location: [facility] ORS;  Service: Ophthalmology;  Laterality:              Left;  [place]   00:49.9 AP%   17.5 CDE   8.75 Fluid Pack               Lot # [id number] No date: EYE SURGERY No date: HAND SURGERY     Comment:  multiple surgeries No date: RETINAL DETACHMENT SURGERY; Bilateral     Comment:  right x 2 and left x 2     Reproductive/Obstetrics negative OB ROS                             Anesthesia Physical Anesthesia Plan  ASA: 3  Anesthesia Plan: General   Post-op Pain Management:    Induction:   PONV Risk Score and Plan: Propofol infusion and TIVA  Airway Management Planned: Natural Airway  Additional Equipment:   Intra-op Plan:   Post-operative Plan:   Informed Consent: I have reviewed the patients History and Physical, chart, labs and discussed the procedure including the risks, benefits and alternatives for the proposed anesthesia with the patient or authorized representative who has indicated his/her understanding and acceptance.     Dental Advisory Given  Plan Discussed with: CRNA and Surgeon  Anesthesia Plan Comments:         Anesthesia Quick Evaluation

## 2022-09-04 NOTE — Transfer of Care (Signed)
Immediate Anesthesia Transfer of Care Note  Patient: Chris Shelton  Procedure(s) Performed: COLONOSCOPY  Patient Location: PACU  Anesthesia Type:General  Level of Consciousness: sedated  Airway & Oxygen Therapy: Patient Spontanous Breathing  Post-op Assessment: Report given to RN and Post -op Vital signs reviewed and stable  Post vital signs: Reviewed and stable  Last Vitals:  Vitals Value Taken Time  BP 108/57 09/04/22 1343  Temp    Pulse 52 09/04/22 1343  Resp 12 09/04/22 1343  SpO2 100 % 09/04/22 1343  Vitals shown include unvalidated device data.  Last Pain:  Vitals:   09/04/22 1233  TempSrc: Temporal  PainSc: 0-No pain         Complications: No notable events documented.

## 2022-09-04 NOTE — Interval H&P Note (Signed)
History and Physical Interval Note: Preprocedure H&P from 09/04/22  was reviewed and there was no interval change after seeing and examining the patient.  Written consent was obtained from the patient after discussion of risks, benefits, and alternatives. Patient has consented to proceed with Esophagogastroduodenoscopy with possible intervention   09/04/2022 1:06 PM  Chris Shelton  has presented today for surgery, with the diagnosis of Colon cancer screening (Z12.11).  The various methods of treatment have been discussed with the patient and family. After consideration of risks, benefits and other options for treatment, the patient has consented to  Procedure(s): COLONOSCOPY (N/A) as a surgical intervention.  The patient's history has been reviewed, patient examined, no change in status, stable for surgery.  I have reviewed the patient's chart and labs.  Questions were answered to the patient's satisfaction.     [name]

## 2022-09-04 NOTE — Anesthesia Postprocedure Evaluation (Signed)
Anesthesia Post Note  Patient: Chris Shelton  Procedure(s) Performed: COLONOSCOPY  Anesthesia Type: General Anesthetic complications: no   No notable events documented.   Last Vitals:  Vitals:   09/04/22 1233  BP: (!) 143/60  Pulse: (!) 57  Resp: 17  Temp: (!) 35.7 C  SpO2: 100%    Last Pain:  Vitals:   09/04/22 1233  TempSrc: Temporal  PainSc: 0-No pain                 Dallie Dad

## 2022-09-04 NOTE — Op Note (Signed)
[facility] Patient Name: Chris Shelton Procedure Date: 09/04/2022 1:02 PM MRN: [id number] Account #: [id number] Date of Birth: 20-Jul-1960 Admit Type: Outpatient Age: 62 Room: [facility] ENDO ROOM 1 Gender: Male Note Status: Finalized Instrument Name: Colonoscope [id number] Procedure:             Colonoscopy Indications:           Screening for colorectal malignant neoplasm Providers:             [name] DO, DO Referring MD:          Andres Labrum, MD (Referring MD) Medicines:             Monitored Anesthesia Care Complications:         No immediate complications. Estimated blood loss:                         Minimal. Procedure:             Pre-Anesthesia Assessment:                        - Prior to the procedure, a History and Physical was                         performed, and patient medications and allergies were                         reviewed. The patient is competent. The risks and                         benefits of the procedure and the sedation options and                         risks were discussed with the patient. All questions                         were answered and informed consent was obtained.                         Patient identification and proposed procedure were                         verified by the physician, the nurse, the anesthetist                         and the technician in the endoscopy suite. Mental                         Status Examination: alert and oriented. Airway                         Examination: normal oropharyngeal airway and neck                         mobility. Respiratory Examination: clear to                         auscultation. CV Examination: RRR, no murmurs, no S3  or S4. Prophylactic Antibiotics: The patient does not                         require prophylactic antibiotics. Prior                         Anticoagulants: The patient has taken no anticoagulant                          or antiplatelet agents. ASA Grade Assessment: III - A                         patient with severe systemic disease. After reviewing                         the risks and benefits, the patient was deemed in                         satisfactory condition to undergo the procedure. The                         anesthesia plan was to use monitored anesthesia care                         (MAC). Immediately prior to administration of                         medications, the patient was re-assessed for adequacy                         to receive sedatives. The heart rate, respiratory                         rate, oxygen saturations, blood pressure, adequacy of                         pulmonary ventilation, and response to care were                         monitored throughout the procedure. The physical                         status of the patient was re-assessed after the                         procedure.                        After obtaining informed consent, the colonoscope was                         passed under direct vision. Throughout the procedure,                         the patient's blood pressure, pulse, and oxygen                         saturations were monitored continuously. The  Colonoscope was introduced through the anus and                         advanced to the the terminal ileum, with                         identification of the appendiceal orifice and IC                         valve. The colonoscopy was performed without                         difficulty. The patient tolerated the procedure well.                         The quality of the bowel preparation was evaluated                         using the BBPS [facility] Bowel Preparation Scale) with                         scores of: Right Colon = 3, Transverse Colon = 3 and                         Left Colon = 3 (entire mucosa seen well with no                         residual  staining, small fragments of stool or opaque                         liquid). The total BBPS score equals 9. The terminal                         ileum, ileocecal valve, appendiceal orifice, and                         rectum were photographed. Findings:      The perianal and digital rectal examinations were normal. Pertinent       negatives include normal sphincter tone.      The terminal ileum appeared normal. Estimated blood loss: none.      A 1 to 2 mm polyp was found in the ascending colon. The polyp was       sessile. The polyp was removed with a jumbo cold forceps. Resection and       retrieval were complete. Estimated blood loss was minimal.      Multiple small-mouthed diverticula were found in the left colon.       Estimated blood loss: none.      Non-bleeding internal hemorrhoids were found during retroflexion. The       hemorrhoids were Grade I (internal hemorrhoids that do not prolapse).       Estimated blood loss: none.      The exam was otherwise without abnormality on direct and retroflexion       views. Impression:            - The examined portion of the ileum was normal.                        -  One 1 to 2 mm polyp in the ascending colon, removed                         with a jumbo cold forceps. Resected and retrieved.                        - Diverticulosis in the left colon.                        - Non-bleeding internal hemorrhoids.                        - The examination was otherwise normal on direct and                         retroflexion views. Recommendation:        - Patient has a contact number available for                         emergencies. The signs and symptoms of potential                         delayed complications were discussed with the patient.                         Return to normal activities tomorrow. Written                         discharge instructions were provided to the patient.                        - Discharge patient to home.                         - Resume previous diet.                        - Continue present medications.                        - Await pathology results.                        - Repeat colonoscopy for surveillance based on                         pathology results.                        - Return to referring physician as previously                         scheduled.                        - Resume Plavix (clopidogrel) at prior dose tomorrow.                         Refer to managing physician for further adjustment of  therapy.                        - The findings and recommendations were discussed with                         the patient. Procedure Code(s):     --- Professional ---                        [phone], Colonoscopy, flexible; with biopsy, single or                         multiple Diagnosis Code(s):     --- Professional ---                        Z12.11, Encounter for screening for malignant neoplasm                         of colon                        K64.0, First degree hemorrhoids                        D12.2, Benign neoplasm of ascending colon                        K57.30, Diverticulosis of large intestine without                         perforation or abscess without bleeding CPT copyright 2022 American Medical Association. All rights reserved. The codes documented in this report are preliminary and upon coder review may  be revised to meet current compliance requirements. Attending Participation:      I personally performed the entire procedure. Volney American, DO [name] DO, DO 09/04/2022 1:42:28 PM This report has been signed electronically. Number of Addenda: 0 Note Initiated On: 09/04/2022 1:02 PM Scope Withdrawal Time: 0 hours 11 minutes 36 seconds  Total Procedure Duration: 0 hours 14 minutes 14 seconds  Estimated Blood Loss:  Estimated blood loss was minimal.      [facility]

## 2022-09-04 NOTE — H&P (Signed)
**  Correction- brother with history of colon polyps per patient, not mother.  Ronne Binning, [facility]

## 2022-09-05 ENCOUNTER — Encounter: Payer: Self-pay | Admitting: Gastroenterology

## 2022-09-05 LAB — SURGICAL PATHOLOGY

## 2022-09-05 NOTE — Anesthesia Postprocedure Evaluation (Signed)
Anesthesia Post Note  Patient: [name]  Procedure(s) Performed: COLONOSCOPY  Patient location during evaluation: PACU Anesthesia Type: General Level of consciousness: awake and alert Pain management: pain level controlled Vital Signs Assessment: post-procedure vital signs reviewed and stable Respiratory status: spontaneous breathing, nonlabored ventilation and respiratory function stable Cardiovascular status: blood pressure returned to baseline and stable Postop Assessment: no apparent nausea or vomiting Anesthetic complications: no   No notable events documented.   Last Vitals:  Vitals:   09/04/22 1353 09/04/22 1403  BP:  (!) 122/53  Pulse:    Resp:    Temp:    SpO2: 100% 100%    Last Pain:  Vitals:   09/04/22 1403  TempSrc:   PainSc: 0-No pain                 [place] Ouch

## 2022-09-08 ENCOUNTER — Other Ambulatory Visit: Payer: Self-pay

## 2022-09-08 ENCOUNTER — Emergency Department
Admission: EM | Admit: 2022-09-08 | Discharge: 2022-09-08 | Disposition: A | Discharge status: Home / Self Care | Payer: BC Managed Care – PPO | Attending: Emergency Medicine | Admitting: Emergency Medicine

## 2022-09-08 DIAGNOSIS — X58XXXA Exposure to other specified factors, initial encounter: Secondary | ICD-10-CM | POA: Diagnosis not present

## 2022-09-08 DIAGNOSIS — T1592XA Foreign body on external eye, part unspecified, left eye, initial encounter: Secondary | ICD-10-CM

## 2022-09-08 DIAGNOSIS — T1502XA Foreign body in cornea, left eye, initial encounter: Secondary | ICD-10-CM | POA: Diagnosis present

## 2022-09-08 DIAGNOSIS — S0502XA Injury of conjunctiva and corneal abrasion without foreign body, left eye, initial encounter: Secondary | ICD-10-CM

## 2022-09-08 MED ORDER — FLUORESCEIN SODIUM 1 MG OP STRP
1.0000 | ORAL_STRIP | Freq: Once | OPHTHALMIC | Status: AC
  Administered 2022-09-08: 1 via OPHTHALMIC
  Filled 2022-09-08: qty 1

## 2022-09-08 MED ORDER — GENTAMICIN SULFATE 0.3 % OP SOLN: 2.0000 [drp] | OPHTHALMIC | 0 refills | Status: DC

## 2022-09-08 MED ORDER — EYE WASH OP SOLN
1.0000 [drp] | OPHTHALMIC | Status: DC | PRN
  Filled 2022-09-08: qty 118

## 2022-09-08 MED ORDER — TETRACAINE HCL 0.5 % OP SOLN
1.0000 [drp] | Freq: Once | OPHTHALMIC | Status: AC
  Administered 2022-09-08: 1 [drp] via OPHTHALMIC
  Filled 2022-09-08: qty 4

## 2022-09-08 NOTE — Discharge Instructions (Signed)
Call make an appointment for [facility] if any worsening of your symptoms or not improving in 24 hours.  Begin using the antibiotic eyedrop that was sent to the pharmacy.  You may also take Tylenol as needed for pain and wear sunglasses for sun protection.

## 2022-09-08 NOTE — ED Triage Notes (Signed)
Pt to ED for possible FB in left eye. States was working on car last night, didn't feel anything. Went to be and now having problem opening left eye and seeing out of it.

## 2022-09-08 NOTE — ED Provider Notes (Signed)
[facility] Provider Note    Event Date/Time   First MD Initiated Contact with Patient 09/08/22 [phone]     (approximate)   History   Eye Problem   HPI  Chris Shelton is a 62 y.o. male   presents to the ED with complaint of left eye pain.  Patient states that he got something in his eye while under a car yesterday.  He states he irrigated his eye and thought that he got it out.  This morning he woke up and was having pain and light sensitivity.  He also has sensation that there is something in his eye.      Physical Exam   Triage Vital Signs: ED Triage Vitals [09/08/22 0855]  Enc Vitals Group     BP      Pulse      Resp      Temp 98 F (36.7 C)     Temp src      SpO2      Weight 175 lb (79.4 kg)     Height 5\' 9"  (1.753 m)     Head Circumference      Peak Flow      Pain Score 2     Pain Loc      Pain Edu?      Excl. in [place]?     Most recent vital signs: Vitals:   09/08/22 0855 09/08/22 1049  BP:  (!) 153/73  Pulse:  (!) 50  Resp:  16  Temp: 98 F (36.7 C)   SpO2:  100%   Visual Acuity Bilateral Distance: 20/40 R Distance: 20/40 L Distance: 20/100   General: Awake, no distress.  Patient appears to be uncomfortable with light sound in the room and eyes are closed. CV:  Good peripheral perfusion.  Resp:  Normal effort.  Abd:  No distention.  Other:  Left mildly injected and tearing.     ED Results / Procedures / Treatments   Labs (all labs ordered are listed, but only abnormal results are displayed) Labs Reviewed - No data to display     PROCEDURES:  Critical Care performed:   .Foreign Body Removal  Date/Time: 09/08/2022 10:30 AM  Performed by: Johnn Hai, PA-C Authorized by: Johnn Hai, PA-C  Consent: Verbal consent obtained. Consent given by: patient Patient identity confirmed: verbally with patient Body area: eye Location details: left eyelid  Anesthesia: Local Anesthetic: tetracaine  drops  Sedation: Patient sedated: no  Localization method: eyelid eversion and visualized Removal mechanism: moist cotton swab Eye examined with fluorescein. Fluorescein uptake. Corneal abrasion size: small Corneal abrasion location: medial No residual rust ring present. Depth: superficial Complexity: simple 1 objects recovered. Post-procedure assessment: foreign body removed Comments: Foreign body was removed from the upper eyelid when lid was inverted.  No foreign body noted to the cornea.  Area was irrigated with eyewash solution.  Foreign body appeared not to be metallic in nature.     MEDICATIONS ORDERED IN ED: Medications  eye wash ((SODIUM/POTASSIUM/SOD CHLORIDE)) ophthalmic solution 1 drop (has no administration in time range)  tetracaine (PONTOCAINE) 0.5 % ophthalmic solution 1 drop (1 drop Left Eye Given 09/08/22 1017)  fluorescein ophthalmic strip 1 strip (1 strip Left Eye Given 09/08/22 1017)     IMPRESSION / MDM / ASSESSMENT AND PLAN / ED COURSE  I reviewed the triage vital signs and the nursing notes.   Differential diagnosis includes, but is not limited to, foreign body, corneal  abrasion, embedded foreign body.  62 year old male presents to the ED with left eye pain after being under a car yesterday and possibly getting a foreign body in his eye which he thought he removed last evening.  A foreign body was noted to be under his upper eyelid and was removed without any difficulty.  This appeared to be nonmetallic.  Small corneal abrasion at approximately 9 o'clock position and patient was made aware.  A prescription for gentamicin ophthalmic solution apply 2 drops every 4 hours while awake.  Patient has an ophthalmologist and will follow-up with [facility] if he is not improving in 24 hours.  Visual acuity as noted above.      Patient's presentation is most consistent with acute, uncomplicated illness.  FINAL CLINICAL IMPRESSION(S) / ED DIAGNOSES    Final diagnoses:  Foreign body of left eye, initial encounter  Abrasion of left cornea, initial encounter     Rx / DC Orders   ED Discharge Orders          Ordered    gentamicin (GARAMYCIN) 0.3 % ophthalmic solution  Every 4 hours        09/08/22 1058             Note:  This document was prepared using Dragon voice recognition software and may include unintentional dictation errors.   Johnn Hai, PA-C 09/08/22 1207    Lavonia Drafts, MD 09/08/22 [phone]

## 2023-10-08 ENCOUNTER — Other Ambulatory Visit: Payer: Self-pay

## 2023-10-08 ENCOUNTER — Inpatient Hospital Stay
Admission: EM | Admit: 2023-10-08 | Discharge: 2023-10-09 | DRG: 280 | Disposition: A | Discharge status: Home / Self Care | Attending: Internal Medicine | Admitting: Internal Medicine

## 2023-10-08 ENCOUNTER — Emergency Department

## 2023-10-08 DIAGNOSIS — Z95818 Presence of other cardiac implants and grafts: Secondary | ICD-10-CM | POA: Diagnosis not present

## 2023-10-08 DIAGNOSIS — I5030 Unspecified diastolic (congestive) heart failure: Secondary | ICD-10-CM | POA: Diagnosis present

## 2023-10-08 DIAGNOSIS — R001 Bradycardia, unspecified: Secondary | ICD-10-CM | POA: Diagnosis not present

## 2023-10-08 DIAGNOSIS — Z79899 Other long term (current) drug therapy: Secondary | ICD-10-CM

## 2023-10-08 DIAGNOSIS — Z880 Allergy status to penicillin: Secondary | ICD-10-CM

## 2023-10-08 DIAGNOSIS — Q2572 Congenital pulmonary arteriovenous malformation: Secondary | ICD-10-CM

## 2023-10-08 DIAGNOSIS — Z7982 Long term (current) use of aspirin: Secondary | ICD-10-CM

## 2023-10-08 DIAGNOSIS — E785 Hyperlipidemia, unspecified: Secondary | ICD-10-CM | POA: Diagnosis present

## 2023-10-08 DIAGNOSIS — I110 Hypertensive heart disease with heart failure: Secondary | ICD-10-CM | POA: Diagnosis present

## 2023-10-08 DIAGNOSIS — I214 Non-ST elevation (NSTEMI) myocardial infarction: Secondary | ICD-10-CM | POA: Diagnosis present

## 2023-10-08 DIAGNOSIS — Z8673 Personal history of transient ischemic attack (TIA), and cerebral infarction without residual deficits: Secondary | ICD-10-CM

## 2023-10-08 LAB — BASIC METABOLIC PANEL WITH GFR
Anion gap: 7 (ref 5–15)
BUN: 17 mg/dL (ref 8–23)
CO2: 27 mmol/L (ref 22–32)
Calcium: 9.4 mg/dL (ref 8.9–10.3)
Chloride: 104 mmol/L (ref 98–111)
Creatinine, Ser: 0.82 mg/dL (ref 0.61–1.24)
GFR, Estimated: >60 mL/min (ref >60)
Glucose, Bld: 107 mg/dL — ABNORMAL HIGH (ref 70–99)
Potassium: 3.7 mmol/L (ref 3.5–5.1)
Sodium: 138 mmol/L (ref 135–145)

## 2023-10-08 LAB — PROTIME-INR
INR: 1.1 (ref 0.8–1.2)
Prothrombin Time: 14.8 s (ref 11.4–15.2)

## 2023-10-08 LAB — HEPATIC FUNCTION PANEL
ALT: 25 U/L (ref 0–44)
AST: 21 U/L (ref 15–41)
Albumin: 4.5 g/dL (ref 3.5–5.0)
Alkaline Phosphatase: 63 U/L (ref 38–126)
Bilirubin, Direct: 0.2 mg/dL (ref 0.0–0.2)
Indirect Bilirubin: 1.1 mg/dL — ABNORMAL HIGH (ref 0.3–0.9)
Total Bilirubin: 1.3 mg/dL — ABNORMAL HIGH (ref 0.0–1.2)
Total Protein: 7.3 g/dL (ref 6.5–8.1)

## 2023-10-08 LAB — LIPASE, BLOOD: Lipase: 65 U/L — ABNORMAL HIGH (ref 11–51)

## 2023-10-08 LAB — CBC
HCT: 47.4 % (ref 39.0–52.0)
Hemoglobin: 16.1 g/dL (ref 13.0–17.0)
MCH: 30.6 pg (ref 26.0–34.0)
MCHC: 34 g/dL (ref 30.0–36.0)
MCV: 90.1 fL (ref 80.0–100.0)
Platelets: 181 10*3/uL (ref 150–400)
RBC: 5.26 MIL/uL (ref 4.22–5.81)
RDW: 12.2 % (ref 11.5–15.5)
WBC: 6.8 10*3/uL (ref 4.0–10.5)
nRBC: 0 % (ref 0.0–0.2)

## 2023-10-08 LAB — TROPONIN I (HIGH SENSITIVITY)
Troponin I (High Sensitivity): 27 ng/L — ABNORMAL HIGH (ref <18)
Troponin I (High Sensitivity): 71 ng/L — ABNORMAL HIGH (ref <18)
Troponin I (High Sensitivity): 251 ng/L (ref <18)

## 2023-10-08 LAB — HEPARIN LEVEL (UNFRACTIONATED)
Heparin Unfractionated: 0.24 [IU]/mL — ABNORMAL LOW (ref 0.30–0.70)
Heparin Unfractionated: 0.39 [IU]/mL (ref 0.30–0.70)

## 2023-10-08 LAB — HIV ANTIBODY (ROUTINE TESTING W REFLEX): HIV Screen 4th Generation wRfx: NONREACTIVE

## 2023-10-08 LAB — APTT: aPTT: 25 s (ref 24–36)

## 2023-10-08 MED ORDER — OMEPRAZOLE MAGNESIUM 20 MG PO TBEC: 20.0000 mg | DELAYED_RELEASE_TABLET | Freq: Every day | ORAL | 3 refills | Status: DC

## 2023-10-08 MED ORDER — ATORVASTATIN CALCIUM 80 MG PO TABS
80.0000 mg | ORAL_TABLET | Freq: Every day | ORAL | Status: DC
  Administered 2023-10-08: 80 mg via ORAL
  Filled 2023-10-08: qty 1

## 2023-10-08 MED ORDER — LISINOPRIL 10 MG PO TABS
20.0000 mg | ORAL_TABLET | Freq: Every day | ORAL | Status: DC
  Filled 2023-10-08: qty 2

## 2023-10-08 MED ORDER — FAMOTIDINE 20 MG PO TABS
20.0000 mg | ORAL_TABLET | Freq: Once | ORAL | Status: AC
  Administered 2023-10-08: 20 mg via ORAL
  Filled 2023-10-08: qty 1

## 2023-10-08 MED ORDER — SODIUM CHLORIDE 0.9 % IV SOLN: 250.0000 mL | INTRAVENOUS | Status: DC | PRN

## 2023-10-08 MED ORDER — ONDANSETRON HCL 4 MG/2ML IJ SOLN: 4.0000 mg | Freq: Four times a day (QID) | INTRAMUSCULAR | Status: DC | PRN

## 2023-10-08 MED ORDER — ALUM & MAG HYDROXIDE-SIMETH 200-200-20 MG/5ML PO SUSP
30.0000 mL | Freq: Once | ORAL | Status: AC
  Administered 2023-10-08: 30 mL via ORAL
  Filled 2023-10-08: qty 30

## 2023-10-08 MED ORDER — ASPIRIN 81 MG PO TBEC
81.0000 mg | DELAYED_RELEASE_TABLET | Freq: Every day | ORAL | Status: DC
Start: 2023-10-09 — End: 2023-10-08

## 2023-10-08 MED ORDER — SIMETHICONE 40 MG/0.6ML PO SUSP: 40.0000 mg | Freq: Four times a day (QID) | ORAL | Status: DC | PRN

## 2023-10-08 MED ORDER — SIMETHICONE 80 MG PO CHEW
40.0000 mg | CHEWABLE_TABLET | Freq: Four times a day (QID) | ORAL | Status: DC | PRN
  Filled 2023-10-08: qty 1

## 2023-10-08 MED ORDER — SODIUM CHLORIDE 0.9% FLUSH
3.0000 mL | Freq: Two times a day (BID) | INTRAVENOUS | Status: DC
  Administered 2023-10-08 – 2023-10-09 (×2): 3 mL via INTRAVENOUS

## 2023-10-08 MED ORDER — HEPARIN BOLUS VIA INFUSION
4000.0000 [IU] | Freq: Once | INTRAVENOUS | Status: AC
  Administered 2023-10-08: 4000 [IU] via INTRAVENOUS
  Filled 2023-10-08: qty 4000

## 2023-10-08 MED ORDER — BRIMONIDINE TARTRATE 0.2 % OP SOLN
1.0000 [drp] | Freq: Two times a day (BID) | OPHTHALMIC | Status: DC
  Administered 2023-10-08 – 2023-10-09 (×3): 1 [drp] via OPHTHALMIC
  Filled 2023-10-08: qty 5

## 2023-10-08 MED ORDER — ACETAMINOPHEN 325 MG PO TABS: 650.0000 mg | ORAL_TABLET | ORAL | Status: DC | PRN

## 2023-10-08 MED ORDER — NITROGLYCERIN 0.4 MG SL SUBL
0.4000 mg | SUBLINGUAL_TABLET | SUBLINGUAL | Status: DC | PRN
  Administered 2023-10-08 (×2): 0.4 mg via SUBLINGUAL
  Filled 2023-10-08 (×2): qty 1

## 2023-10-08 MED ORDER — ASPIRIN 81 MG PO CHEW
162.0000 mg | CHEWABLE_TABLET | Freq: Once | ORAL | Status: AC
  Administered 2023-10-08: 162 mg via ORAL
  Filled 2023-10-08: qty 2

## 2023-10-08 MED ORDER — SODIUM CHLORIDE 0.9 % WEIGHT BASED INFUSION
3.0000 mL/kg/h | INTRAVENOUS | Status: AC
  Administered 2023-10-08: 3 mL/kg/h via INTRAVENOUS

## 2023-10-08 MED ORDER — ASPIRIN 81 MG PO CHEW
81.0000 mg | CHEWABLE_TABLET | ORAL | Status: AC
Start: 2023-10-09 — End: 2023-10-09
  Administered 2023-10-09: 81 mg via ORAL
  Filled 2023-10-08: qty 1

## 2023-10-08 MED ORDER — HYDROCHLOROTHIAZIDE 12.5 MG PO TABS
12.5000 mg | ORAL_TABLET | Freq: Every day | ORAL | Status: DC
  Filled 2023-10-08: qty 1

## 2023-10-08 MED ORDER — LIDOCAINE VISCOUS HCL 2 % MT SOLN
15.0000 mL | Freq: Once | OROMUCOSAL | Status: AC
  Administered 2023-10-08: 15 mL via ORAL
  Filled 2023-10-08: qty 15

## 2023-10-08 MED ORDER — SODIUM CHLORIDE 0.9 % WEIGHT BASED INFUSION
1.0000 mL/kg/h | INTRAVENOUS | Status: DC
  Administered 2023-10-08 – 2023-10-09 (×2): 1 mL/kg/h via INTRAVENOUS

## 2023-10-08 MED ORDER — SODIUM CHLORIDE 0.9% FLUSH: 3.0000 mL | INTRAVENOUS | Status: DC | PRN

## 2023-10-08 MED ORDER — HEPARIN (PORCINE) 25000 UT/250ML-% IV SOLN
1250.0000 [IU]/h | INTRAVENOUS | Status: DC
  Administered 2023-10-08: 1100 [IU]/h via INTRAVENOUS
  Administered 2023-10-09: 1250 [IU]/h via INTRAVENOUS
  Filled 2023-10-08 (×2): qty 250

## 2023-10-08 MED ORDER — LOSARTAN POTASSIUM 50 MG PO TABS
50.0000 mg | ORAL_TABLET | Freq: Every day | ORAL | Status: DC
  Administered 2023-10-08 – 2023-10-09 (×2): 50 mg via ORAL
  Filled 2023-10-08 (×2): qty 1

## 2023-10-08 MED ORDER — HEPARIN BOLUS VIA INFUSION
1250.0000 [IU] | Freq: Once | INTRAVENOUS | Status: AC
  Administered 2023-10-08: 1250 [IU] via INTRAVENOUS
  Filled 2023-10-08: qty 1250

## 2023-10-08 MED ORDER — MORPHINE SULFATE (PF) 4 MG/ML IV SOLN
4.0000 mg | Freq: Once | INTRAVENOUS | Status: AC
  Administered 2023-10-08: 4 mg via INTRAVENOUS
  Filled 2023-10-08: qty 1

## 2023-10-08 MED ORDER — IOHEXOL 350 MG/ML SOLN
100.0000 mL | Freq: Once | INTRAVENOUS | Status: AC | PRN
  Administered 2023-10-08: 100 mL via INTRAVENOUS

## 2023-10-08 MED ORDER — ASPIRIN 81 MG PO CHEW: 81.0000 mg | CHEWABLE_TABLET | Freq: Every day | ORAL | Status: DC

## 2023-10-08 NOTE — Progress Notes (Signed)
 PHARMACY - ANTICOAGULATION CONSULT NOTE  Pharmacy Consult for heparin infusion Indication: chest pain/ACS  Allergies  Allergen Reactions   Penicillins Anaphylaxis    Pt states that "I almost died"; received it as an infant    Patient Measurements: Height: 5\' 9"  (175.3 cm) Weight: 83 kg (183 lb) IBW/kg (Calculated) : 70.7 HEPARIN DW (KG): 83  Vital Signs: Temp: 98.2 F (36.8 C) (04/17 1240) Temp Source: Oral (04/17 1240) BP: 118/56 (04/17 1500) Pulse Rate: 52 (04/17 1500)  Labs: Recent Labs    10/08/23 0445 10/08/23 0645 10/08/23 0928 10/08/23 1000 10/08/23 1606  HGB 16.1  --   --   --   --   HCT 47.4  --   --   --   --   PLT 181  --   --   --   --   APTT  --   --  25  --   --   LABPROT  --   --  14.8  --   --   INR  --   --  1.1  --   --   HEPARINUNFRC  --   --   --   --  0.24*  CREATININE 0.82  --   --   --   --   TROPONINIHS 27* 71*  --  251*  --    Estimated Creatinine Clearance: 92.2 mL/min (by C-G formula based on SCr of 0.82 mg/dL).  Medical History: Past Medical History:  Diagnosis Date   Cataracts, bilateral 2019   Detached retina    Palpitations    Sinus trouble 2019   Stroke [facility])    TIA (transient ischemic attack)    Assessment: 63 y.o. male with medical history significant of cryptogenic stroke, HTN, HLD, presented with worsening of chest pain. He is on no chronic anticoagulation prior to arrival.   Baseline Labs: H&H, PLT wnl, INR, aPTT pending  Goal of Therapy:  Heparin level 0.3-0.7 units/ml Monitor platelets by anticoagulation protocol: Yes  Labs: 0417 1606 HL 0.24 - subtherapeutic on 1100 units/hr    Plan:  HL subtherapeutic  Give 1250 unit bolus and increase heparin infusion to 1250 units/hr  Recheck HL 6 hours after rate change  Monitor CBC daily while on heparin   Halsey Hammen, PharmD Pharmacy Resident  10/08/2023 4:32 PM

## 2023-10-08 NOTE — Progress Notes (Signed)
 PHARMACY - ANTICOAGULATION CONSULT NOTE  Pharmacy Consult for heparin infusion Indication: chest pain/ACS  Allergies  Allergen Reactions   Penicillins Anaphylaxis    Pt states that "I almost died"; received it as an infant    Patient Measurements: Height: 5\' 9"  (175.3 cm) Weight: 83.1 kg (183 lb 1.6 oz) IBW/kg (Calculated) : 70.7 HEPARIN DW (KG): 83  Vital Signs: Temp: 97.8 F (36.6 C) (04/17 2325) Temp Source: Temporal (04/17 2059) BP: 126/64 (04/17 2325) Pulse Rate: 65 (04/17 2325)  Labs: Recent Labs    10/08/23 0445 10/08/23 0645 10/08/23 0928 10/08/23 1000 10/08/23 1606 10/08/23 2312  HGB 16.1  --   --   --   --   --   HCT 47.4  --   --   --   --   --   PLT 181  --   --   --   --   --   APTT  --   --  25  --   --   --   LABPROT  --   --  14.8  --   --   --   INR  --   --  1.1  --   --   --   HEPARINUNFRC  --   --   --   --  0.24* 0.39  CREATININE 0.82  --   --   --   --   --   TROPONINIHS 27* 71*  --  251*  --   --    Estimated Creatinine Clearance: 92.2 mL/min (by C-G formula based on SCr of 0.82 mg/dL).  Medical History: Past Medical History:  Diagnosis Date   Cataracts, bilateral 2019   Detached retina    Palpitations    Sinus trouble 2019   Stroke [facility])    TIA (transient ischemic attack)    Assessment: 63 y.o. male with medical history significant of cryptogenic stroke, HTN, HLD, presented with worsening of chest pain. He is on no chronic anticoagulation prior to arrival.   Baseline Labs: H&H, PLT wnl, INR, aPTT pending  Goal of Therapy:  Heparin level 0.3-0.7 units/ml Monitor platelets by anticoagulation protocol: Yes  Labs: 0417 1606 HL 0.24 - subtherapeutic on 1100 units/hr 0417 2312 HL 0.39, therapeutic x 1    Plan:  Continue heparin infusion at 1250 units/hr  Recheck HL w/ AM labs to confirm Monitor CBC daily while on heparin   Coretta Dexter, PharmD, [facility] 10/08/2023 11:51 PM

## 2023-10-08 NOTE — ED Triage Notes (Signed)
 Pt to ED with central chest pain x 1 hour upon waking. Pt denies SOB/dizziness. Pt states he is currently wearing a heart monitor due to a recent stroke.   Pt took prescribed Aspirin

## 2023-10-08 NOTE — ED Notes (Signed)
 Pt is uncomfortable taking meds as he never really has and requests to speak with provider prior to taking any more. Dr. Jeane Miguel secure chat message sent regarding this.

## 2023-10-08 NOTE — Progress Notes (Signed)
 PHARMACY - ANTICOAGULATION CONSULT NOTE  Pharmacy Consult for heparin infusion Indication: chest pain/ACS  Allergies  Allergen Reactions   Penicillin G     Other reaction(s): Other (See Comments) Pt states that "I almost died"; received it as an infant   Penicillins Anaphylaxis    Patient Measurements: Height: 5\' 9"  (175.3 cm) Weight: 83 kg (183 lb) IBW/kg (Calculated) : 70.7 HEPARIN DW (KG): 83  Vital Signs: Temp: 98 F (36.7 C) (04/17 0831) Temp Source: Oral (04/17 0831) BP: 185/89 (04/17 0802) Pulse Rate: 56 (04/17 0802)  Labs: Recent Labs    10/08/23 0445 10/08/23 0645  HGB 16.1  --   HCT 47.4  --   PLT 181  --   CREATININE 0.82  --   TROPONINIHS 27* 71*    Estimated Creatinine Clearance: 92.2 mL/min (by C-G formula based on SCr of 0.82 mg/dL).   Medical History: Past Medical History:  Diagnosis Date   Cataracts, bilateral 2019   Detached retina    Palpitations    Sinus trouble 2019   Stroke [facility])    TIA (transient ischemic attack)     Assessment: 63 y.o. male with medical history significant of cryptogenic stroke, HTN, HLD, presented with worsening of chest pain.  He is on no chronic anticoagulation prior to arrival  Baseline Labs: H&H, PLT wnl, INR, aPTT pending  Goal of Therapy:  Heparin level 0.3-0.7 units/ml Monitor platelets by anticoagulation protocol: Yes   Plan:  Give 4000 units bolus x 1 Start heparin infusion at 1100 units/hr Check anti-Xa level in 6 hours and daily while on heparin Continue to monitor H&H and platelets  Adalberto Acton 10/08/2023,8:57 AM

## 2023-10-08 NOTE — Consult Note (Signed)
 [facility] CLINIC CARDIOLOGY CONSULT NOTE       Patient ID: Chris Shelton MRN: [id number] DOB/AGE: 28-Nov-1960 63 y.o.  Admit date: 10/08/2023 Referring Physician Dr. Chipper Herb Primary Physician Gracelyn Nurse, MD  Primary Cardiologist Lum Babe, PA-C ([facility]) Reason for Consultation AF RVR, HFpEF  HPI: Chris Shelton is a 63 y.o. male  with a past medical history of hypertension, hyperlipidemia, sinus bradycardia, s/p ILR implant ([employer] Lux from 09/20/2021), hx of CVA, hx basilar artery stenosis with infarction who presented to the ED on 10/08/2023 for substernal chest pain with radiation to his back while walking to the bathroom at 3 AM. Cardiology was consulted for further evaluation.   Patient states he reported to the ED after having an episode of chest pain and high BP at 200/100 early this morning at [facility] after getting up to go to the bathroom. Patient states he had another episode of chest pain and high blood pressure 2 nights ago after working out that lasted about 15 minutes, that relieved with resting and belching. Patient says for years he has a"pinching" feeling on his chest and abdomen that is associated with exertion, high BP and gas that is usually relieved with belching. The episode that occurred early this morning lasted longer that usual, about one hour and states he wasn't able to burp like normal so his chest pain persisted. Given this he decided to come to the ED. Workup in the ED notable for na 138, K 3.7, Cr 0.82, Hgb 16.1, platelets 181. Troponins elevated and trending 27 > 71 > 251. EKG in ED showed sinus bradycardia, rate 56 bpm, with no significant ST changes. CT angio showed no evidence of thoracoabdominal aortic aneurysm and revealed aortic atherosclerosis. CXR showed no active cardiopulmonary disease. Patient received ASA, SL nitroglycerin, Pepcid that provided minimal relief. IV heparin gtt was started.  At the time of my evaluation this morning patient resting  comfortable with family at bedside in no acute distress. Discussed his symptoms in further. Patient states he has minimal chest discomfort at this time. Patient states the second dose of SL nitroglycerin in the ED provided more relief. Patient denies SOB, palpitations, dizziness, or syncope. Patient states he is very active and has no functional limitations. Patient states he takes his BP regularly at home and ranges SBP of 130s-120s and DBP of 70s-60s. Patient states when he normally has his episodes of chest pain his BP elevates as well. This AM patient has been hypertensive and most recent BP reading shows improvement at 118/63. HR is stable as patient has baseline sinus bradycardia.   Review of systems complete and found to be negative unless listed above    Past Medical History:  Diagnosis Date   Cataracts, bilateral 2019   Detached retina    Palpitations    Sinus trouble 2019   Stroke [facility])    TIA (transient ischemic attack)     Past Surgical History:  Procedure Laterality Date   CATARACT EXTRACTION Right    CATARACT EXTRACTION W/PHACO Left 08/25/2017   Procedure: CATARACT EXTRACTION PHACO AND INTRAOCULAR LENS PLACEMENT (IOC);  Surgeon: Galen Manila, MD;  Location: [facility] ORS;  Service: Ophthalmology;  Laterality: Left;  [place]   00:49.9 AP%   17.5 CDE   8.75 Fluid Pack Lot # [id number]   COLONOSCOPY N/A 09/04/2022   Procedure: COLONOSCOPY;  Surgeon: Jaynie Collins, DO;  Location: [facility] ENDOSCOPY;  Service: Gastroenterology;  Laterality: N/A;   EYE SURGERY     HAND  SURGERY     multiple surgeries   heart monitor insertion  09/2021   RETINAL DETACHMENT SURGERY Bilateral    right x 2 and left x 2    (Not in a hospital admission)  Social History   Socioeconomic History   Marital status: Married    Spouse name: Not on file   Number of children: Not on file   Years of education: Not on file   Highest education level: Not on file  Occupational History   Not on file   Tobacco Use   Smoking status: Never   Smokeless tobacco: Never  Vaping Use   Vaping status: Never Used  Substance and Sexual Activity   Alcohol use: No   Drug use: Never   Sexual activity: Yes    Partners: Female  Other Topics Concern   Not on file  Social History Narrative   Not on file   Social Drivers of Health   Financial Resource Strain: Not on file  Food Insecurity: Not on file  Transportation Needs: Not on file  Physical Activity: Not on file  Stress: Not on file  Social Connections: Not on file  Intimate Partner Violence: Not on file    History reviewed. No pertinent family history.   Vitals:   10/08/23 0645 10/08/23 0802 10/08/23 0831 10/08/23 1141  BP:  (!) 185/89  118/63  Pulse: (!) 50 (!) 56  (!) 53  Resp: 10 17  14   Temp:   98 F (36.7 C)   TempSrc:   Oral   SpO2: 100% 100%  98%  Weight:      Height:        PHYSICAL EXAM General: well appearing male, well nourished, in no acute distress. HEENT: Normocephalic and atraumatic. Neck: No JVD.  Lungs: Normal respiratory effort on room air. Clear bilaterally to auscultation. No wheezes, crackles, rhonchi.  Heart: HRRR. Normal S1 and S2 without gallops or murmurs.  Abdomen: Non-distended appearing.  Msk: Normal strength and tone for age. Extremities: Warm and well perfused. No clubbing, cyanosis. No edema.  Neuro: Alert and oriented X 3. Psych: Answers questions appropriately.   Labs: Basic Metabolic Panel: Recent Labs    10/08/23 0445  NA 138  K 3.7  CL 104  CO2 27  GLUCOSE 107*  BUN 17  CREATININE 0.82  CALCIUM 9.4   Liver Function Tests: Recent Labs    10/08/23 0445  AST 21  ALT 25  ALKPHOS 63  BILITOT 1.3*  PROT 7.3  ALBUMIN 4.5   Recent Labs    10/08/23 0445  LIPASE 65*   CBC: Recent Labs    10/08/23 0445  WBC 6.8  HGB 16.1  HCT 47.4  MCV 90.1  PLT 181   Cardiac Enzymes: Recent Labs    10/08/23 0445 10/08/23 0645 10/08/23 1000  TROPONINIHS 27* 71* 251*    BNP: No results for input(s): "BNP" in the last 72 hours. D-Dimer: No results for input(s): "DDIMER" in the last 72 hours. Hemoglobin A1C: No results for input(s): "HGBA1C" in the last 72 hours. Fasting Lipid Panel: No results for input(s): "CHOL", "HDL", "LDLCALC", "TRIG", "CHOLHDL", "LDLDIRECT" in the last 72 hours. Thyroid Function Tests: No results for input(s): "TSH", "T4TOTAL", "T3FREE", "THYROIDAB" in the last 72 hours.  Invalid input(s): "FREET3" Anemia Panel: No results for input(s): "VITAMINB12", "FOLATE", "FERRITIN", "TIBC", "IRON", "RETICCTPCT" in the last 72 hours.   Radiology: CT Angio Chest/Abd/Pel for Dissection W and/or Wo Contrast Result Date: 10/08/2023 CLINICAL DATA:  Central  chest pain for 1 hour. Acute aortic syndrome suspected. EXAM: CT ANGIOGRAPHY CHEST, ABDOMEN AND PELVIS TECHNIQUE: Non-contrast CT of the chest was initially obtained. Multidetector CT imaging through the chest, abdomen and pelvis was performed using the standard protocol during bolus administration of intravenous contrast. Multiplanar reconstructed images and MIPs were obtained and reviewed to evaluate the vascular anatomy. RADIATION DOSE REDUCTION: This exam was performed according to the departmental dose-optimization program which includes automated exposure control, adjustment of the mA and/or kV according to patient size and/or use of iterative reconstruction technique. CONTRAST:  OMNIPAQUE IOHEXOL 350 MG/ML SOLN COMPARISON:  None Available. FINDINGS: CTA CHEST FINDINGS Cardiovascular: Pre contrast imaging shows no hyperdense crescent in the wall of the thoracic aorta to suggest the presence of an acute intramural hematoma. Ascending thoracic aorta measures 3.8 cm diameter. Imaging after IV contrast administration shows no dissection of the thoracic aorta. Arterial aortic arch branch vessel anatomy is widely patent. Mild atherosclerotic calcification evident in the thoracic aorta.  Mediastinum/Nodes: No mediastinal lymphadenopathy. There is no hilar lymphadenopathy. The esophagus has normal imaging features. There is no axillary lymphadenopathy. Lungs/Pleura: 11 x 9 mm lesion in the left lower lobe (image 111/6) mimics blood pool enhancement on postcontrast imaging and appears to be unenhanced on precontrast imaging (43/4). While there is no obvious enlarged draining pulmonary vein, pulmonary AVM or peripheral saccular pulmonary arterial aneurysm could have this appearance. No focal airspace consolidation. No pleural effusion. Musculoskeletal: No worrisome lytic or sclerotic osseous abnormality. Review of the MIP images confirms the above findings. CTA ABDOMEN AND PELVIS FINDINGS VASCULAR Aorta: Normal caliber aorta without aneurysm, dissection, vasculitis or significant stenosis. Moderate atherosclerosis. Celiac: Patent without evidence of aneurysm, dissection, vasculitis or significant stenosis. SMA: Patent without evidence of aneurysm, dissection, vasculitis or significant stenosis. Renals: Both renal arteries are patent without evidence of aneurysm, dissection, vasculitis, fibromuscular dysplasia or significant stenosis. Accessory renal arteries identified bilaterally. IMA: Patent without evidence of aneurysm, dissection, vasculitis or significant stenosis. Inflow: Patent without evidence of aneurysm, dissection, vasculitis or significant stenosis. Veins: No obvious venous abnormality within the limitations of this arterial phase study. Review of the MIP images confirms the above findings. NON-VASCULAR Hepatobiliary: No suspicious focal abnormality within the liver parenchyma. There is no evidence for gallstones, gallbladder wall thickening, or pericholecystic fluid. No intrahepatic or extrahepatic biliary dilation. Pancreas: No focal mass lesion. No dilatation of the main duct. No intraparenchymal cyst. No peripancreatic edema. Spleen: No splenomegaly. No suspicious focal mass lesion.  Adrenals/Urinary Tract: No adrenal nodule or mass. Kidneys unremarkable. No evidence for hydroureter. The urinary bladder appears normal for the degree of distention. Stomach/Bowel: Stomach is unremarkable. No gastric wall thickening. No evidence of outlet obstruction. Duodenum is normally positioned as is the ligament of Treitz. No small bowel wall thickening. No small bowel dilatation. The terminal ileum is normal. The appendix is normal. No gross colonic mass. No colonic wall thickening. Lymphatic: There is no gastrohepatic or hepatoduodenal ligament lymphadenopathy. No retroperitoneal or mesenteric lymphadenopathy. No pelvic sidewall lymphadenopathy. Reproductive: The prostate gland and seminal vesicles are unremarkable. Other: No intraperitoneal free fluid. Musculoskeletal: Small bilateral groin hernias contain only fat. No worrisome lytic or sclerotic osseous abnormality. Review of the MIP images confirms the above findings. IMPRESSION: 1. No evidence for thoracoabdominal aortic aneurysm. No dissection of the thoracoabdominal aorta. 2. 11 x 9 mm lesion in the left lower lobe mimics blood pool enhancement on postcontrast imaging and appears to be unenhanced on precontrast imaging. While there is no obvious enlarged  draining pulmonary vein, a pulmonary AVM or peripheral saccular pulmonary arterial aneurysm could have this appearance. Soft tissue pulmonary nodule is considered less likely but is a possibility. Consider follow-up in 3 months to ensure stability. 3. Small bilateral groin hernias contain only fat. 4. Aortic atherosclerosis. Electronically Signed   By: Kennith Center M.D.   On: 10/08/2023 06:11   DG Chest 2 View Result Date: 10/08/2023 CLINICAL DATA:  Chest pain.  Shortness of breath and dizziness. EXAM: CHEST - 2 VIEW COMPARISON:  None Available. FINDINGS: The lungs are clear without focal pneumonia, edema, pneumothorax or pleural effusion. The cardiopericardial silhouette is within normal limits  for size. No acute bony abnormality. Telemetry leads overlie the chest. IMPRESSION: No active cardiopulmonary disease. Electronically Signed   By: Kennith Center M.D.   On: 10/08/2023 05:03   [employer] ILR (reviewed on 03/31/2023 with Lum Babe, PA-C): No dysrhythmias documented since device implant. No true atrial fibrillation has been documented since device implant  ECHO ordered.  TELEMETRY reviewed by me 10/08/2023: Sinus bradycardia, rate 50s  EKG reviewed by me: Sinus bradycardia, rate 56 bpm, with no significant ST changes.  Data reviewed by me 10/08/2023: last 24h vitals tele labs imaging I/O ED provider note, admission H&P  Principal Problem:   NSTEMI (non-ST elevated myocardial infarction) [facility])    ASSESSMENT AND PLAN:  Chris Shelton is a 63 y.o. male  with a past medical history of hypertension, hyperlipidemia, sinus bradycardia, cardiac device in situ ([employer] Lux ILR from 09/20/2021), hx of CVA, hx basilar artery stenosis with infarction who presented to the ED on 10/08/2023 for substernal chest pain with radiation to his back while walking to the bathroom at 3 AM. Cardiology was consulted for further evaluation.   # Hypertension # Hyperlipidemia # NSTEMI, mildly elevated troponins Patient reports to the ED due to chest discomfort and elevated BP that lasted about 1 hour after getting up at 3AM to go to bathroom. Patient has a history of chest discomfort  and episodic elevated BP that normally lasts shorter period of time. Patient reports his chest discomfort is usually associated with gas and belching relieves his chest pain. Patient denies SOB, palpitations, dizziness, syncope. Patient reports minimal chest pain relief s/p ASA, SL nitroglycerin, Pepcid. At the time of my eval this AM pt states he still has mild chest discomfort. At this time patient was hypertensive and now BP is improving. HR is stable. Troponins 27 > 71 > 251. -Echo ordered  -Continue IV  heparin gtt.  -Continue ASA 81 mg and atorvastatin 80 mg daily. -Continue losartan 50 mg daily. -Defer adding GDMT beta blocker for now due to patients baseline bradycardia -Discussed the risks and benefits of proceeding with LHC for further evaluation with the patient.  He is agreeable to proceed.  NPO after midnight until [facility] tomorrow afternoon (10/09/23) with Dr. Juliann Pares.  Written consent will be obtained.  Further recommendations following LHC.    TIMI Risk Score for Unstable Angina or Non-ST Elevation MI:   The patient's TIMI risk score is 4, which indicates a 20% risk of all cause mortality, new or recurrent myocardial infarction or need for urgent revascularization in the next 14 days.   This patient's plan of care was discussed and created with Dr. Melton Alar and she is in agreement.  Signed: Gale Journey, PA-C  10/08/2023, 12:27 PM [facility] Cardiology

## 2023-10-08 NOTE — ED Notes (Signed)
 RN followed up with pt post nitroglycerin and pt sts that his pain is a 2/10 at this time. Pt sts that after the nitroglycerin he belched and he is feeling better at this time. Pt is unsure if the nitroglycerin helped or his belching did.

## 2023-10-08 NOTE — ED Provider Notes (Addendum)
 [facility] Provider Note    Event Date/Time   First MD Initiated Contact with Patient 10/08/23 0503     (approximate)   History   Chest Pain   HPI  Chris Shelton is a 63 y.o. male   Past medical history of stroke on aspirin, sinus bradycardia, essential hypertension who no longer takes antihypertensives as he was told to hold home antihypertensives to allow for permissive hypertension after his stroke in 2023 who presents to the Emergency Department with chest pain.  He awoke at around 3 AM to use the bathroom and upon ambulating he noted substernal chest pain that was quite severe and radiated to his back.  He rested and despite resting the pain persisted and so he came to the emergency department.  He notes no associated shortness of breath and currently has 5 out of 10 chest pain.  He took 2 of his baby aspirin tonight before bedtime.  He notes that he has a longstanding history of exertional chest pain that has been worked up by his cardiologist without any significant findings, dating back years.  He notes that he has similar substernal chest pain after exercising that goes away after he burps.  He states that the quality of his chest pain today is similar but it is unusual and that he had very mild exertion to elicit this chest pain, and it is unusual that is not going away as it normally does.  He denies any respiratory infectious symptoms.  He denies abdominal pain.  Independent Historian contributed to assessment above: His wife is at bedside to corroborate information past medical history as above  External Medical Documents Reviewed: Echo obtained in January 2023 showed a normal left ventricular ejection fraction and grade 1 diastolic dysfunction, and normal aortic valve      Physical Exam   Triage Vital Signs: ED Triage Vitals  Encounter Vitals Group     BP 10/08/23 0442 (!) 194/127     Systolic BP Percentile --      Diastolic BP Percentile  --      Pulse Rate 10/08/23 0442 96     Resp 10/08/23 0442 18     Temp 10/08/23 0442 97.8 F (36.6 C)     Temp Source 10/08/23 0442 Oral     SpO2 10/08/23 0442 100 %     Weight 10/08/23 0439 183 lb (83 kg)     Height 10/08/23 0439 5\' 9"  (1.753 m)     Head Circumference --      Peak Flow --      Pain Score 10/08/23 0439 5     Pain Loc --      Pain Education --      Exclude from Growth Chart --     Most recent vital signs: Vitals:   10/08/23 0442 10/08/23 0600  BP: (!) 194/127 (!) 144/67  Pulse: 96 (!) 55  Resp: 18 12  Temp: 97.8 F (36.6 C)   SpO2: 100% 97%    General: Awake, no distress.  CV:  Good peripheral perfusion.  Resp:  Normal effort.  Abd:  No distention.  Other:  Pleasant gentleman in no acute distress.  He is markedly hypertensive 190s over 120s but otherwise vital signs are normal.  He has a soft benign abdominal exam deep palpation all quadrants.  His lungs are clear to auscultation bilaterally and his skin appears warm well-perfused, there is no significant peripheral edema.   ED Results / Procedures /  Treatments   Labs (all labs ordered are listed, but only abnormal results are displayed) Labs Reviewed  BASIC METABOLIC PANEL WITH GFR - Abnormal; Notable for the following components:      Result Value   Glucose, Bld 107 (*)    All other components within normal limits  HEPATIC FUNCTION PANEL - Abnormal; Notable for the following components:   Total Bilirubin 1.3 (*)    Indirect Bilirubin 1.1 (*)    All other components within normal limits  LIPASE, BLOOD - Abnormal; Notable for the following components:   Lipase 65 (*)    All other components within normal limits  TROPONIN I (HIGH SENSITIVITY) - Abnormal; Notable for the following components:   Troponin I (High Sensitivity) 27 (*)    All other components within normal limits  TROPONIN I (HIGH SENSITIVITY) - Abnormal; Notable for the following components:   Troponin I (High Sensitivity) 71 (*)     All other components within normal limits  CBC     I ordered and reviewed the above labs they are notable for his initial troponin is 27  EKG  ED ECG REPORT I, Buell Carmin, the attending physician, personally viewed and interpreted this ECG.   Date: 10/08/2023  EKG Time: 0440  Rate: 56  Rhythm: sinus bradycardia  Axis: nl  Intervals:none  ST&T Change: no stemi    RADIOLOGY I independently reviewed and interpreted chest x-ray and I see no obvious focality pneumothorax I also reviewed radiologist's formal read.   PROCEDURES:  Critical Care performed: Yes, see critical care procedure note(s)  .Critical Care  Performed by: Buell Carmin, MD Authorized by: Buell Carmin, MD   Critical care provider statement:    Critical care time (minutes):  30   Critical care was time spent personally by me on the following activities:  Development of treatment plan with patient or surrogate, discussions with consultants, evaluation of patient's response to treatment, examination of patient, ordering and review of laboratory studies, ordering and review of radiographic studies, ordering and performing treatments and interventions, pulse oximetry, re-evaluation of patient's condition and review of old charts    MEDICATIONS ORDERED IN ED: Medications  nitroGLYCERIN (NITROSTAT) SL tablet 0.4 mg (0.4 mg Sublingual Given 10/08/23 0541)  aspirin chewable tablet 162 mg (162 mg Oral Given 10/08/23 0539)  morphine (PF) 4 MG/ML injection 4 mg (4 mg Intravenous Given 10/08/23 0540)  iohexol (OMNIPAQUE) 350 MG/ML injection 100 mL (100 mLs Intravenous Contrast Given 10/08/23 0545)  alum & mag hydroxide-simeth (MAALOX/MYLANTA) 200-200-20 MG/5ML suspension 30 mL (30 mLs Oral Given 10/08/23 0654)    And  lidocaine (XYLOCAINE) 2 % viscous mouth solution 15 mL (15 mLs Oral Given 10/08/23 0654)  famotidine (PEPCID) tablet 20 mg (20 mg Oral Given 10/08/23 0654)     IMPRESSION / MDM / ASSESSMENT AND PLAN / ED COURSE   I reviewed the triage vital signs and the nursing notes.                                Patient's presentation is most consistent with acute presentation with potential threat to life or bodily function.  Differential diagnosis includes, but is not limited to, ACS, PE, dissection, GERD/ulcer/gas pain, pancreatitis, biliary colic   The patient is on the cardiac monitor to evaluate for evidence of arrhythmia and/or significant heart rate changes.  MDM:    Here is a patient with cardiac risk factors presents with exertional  chest pain that is constant and severe and somewhat different from his longstanding exertional chest pain in the past.  I am concerned he is having ACS but fortunately his EKG is nonischemic.  His initial troponin is 27 but given the acuity of his chest pain it is important that we follow-up with serial troponin.  He took 2 baby aspirin tonight and I will have him do another 2 for a full dose of aspirin.  He is markedly hypertensive and has a history of hypertension but no longer on antihypertensives.  I will give him some sublingual nitro and some IV morphine for pain control and see how his blood pressure responds, and address it with antihypertensive if it remains high, as he has a history of hypertension no longer medications he may need to restart. This will serve as impulse control as I have some suspicion for dissection as well, given the severity of substernal chest pain, hypertension and radiation to the back- for which I am ordering a CT angiogram.  -- He has ongoing albeit somewhat improved chest pain. Unfortunately troponin bumped from 20-->70 in setting of ongoing substernal chest pain there is concern for NSTEMI and so I will admit him to hospitalist service.  I am hesitant to start heparin at this juncture given the CT scan when I was curious about dissection, though did not show dissection did not show this 11 x 9 mm lesion in the left lower lobe that mimics  blood pooling with enhancement postcontrast and unenhanced precontrast. So with any question of bleeding in the lung (despite his hemodynamic stability and normal H&H) I would not like to start heparin until further clarity on this lesion and consultation with cardiology in the appropriate specialist while inpatient.     FINAL CLINICAL IMPRESSION(S) / ED DIAGNOSES   Final diagnoses:  NSTEMI (non-ST elevated myocardial infarction) (HCC)     Rx / DC Orders   ED Discharge Orders          Ordered    omeprazole (PRILOSEC OTC) 20 MG tablet  Daily        10/08/23 0652    Ambulatory referral to Gastroenterology        10/08/23 3474             Note:  This document was prepared using Dragon voice recognition software and may include unintentional dictation errors.    Buell Carmin, MD 10/08/23 2595    Buell Carmin, MD 10/08/23 6387    Buell Carmin, MD 10/08/23 [phone]

## 2023-10-08 NOTE — H&P (Addendum)
 History and Physical    Chris Shelton ZOX:[id number] DOB: Oct 21, 1960 DOA: 10/08/2023  PCP: Gracelyn Nurse, MD (Confirm with patient/family/NH records and if not entered, this has to be entered at [facility] point of entry) Patient coming from: Home  I have personally briefly reviewed patient's old medical records in [facility]  Chief Complaint: Chest pain  HPI: Chris Shelton is a 63 y.o. male with medical history significant of cryptogenic stroke, HTN, HLD, presented with worsening of chest pain.  Patient has history of cryptogenic stroke 2 years ago, for which she has been followed with [facility] cardiology and under a long-term cardiac monitoring and so far there is no positive result reported.  He takes statin and aspirin but not on any BP medications.  At baseline he is active and doing treadmills and gym every day.  Lately he has experienced intermittent sharp chest pain usually after strenuous exercise.  Initially was bilateral rib cage, last few weeks he has experienced more centrally located sharp chest pain, 5 to 10 minutes after exercise and resolved by itself within 10 minutes without any intervention.  He also observed significant blood pressure elevation during his chest pain episode and as BP can be as high as 210.Marland Kitchen  And blood pressure came down after chest pain subsided in about 10 minutes. He attributed to the chest pain to " gas and burping" problems.  He had a 30-minute workup last night and went to bed.  This morning around 3 AM, he woke up with severe chest pain 8-9/10, sharp-like centrally located nonradiating, associated with " gas up" but this time, the chest pain stayed and there was no exacerbation or relieving factors after burping for 5 minutes.  ED Course: Afebrile, borderline bradycardia, blood pressure SBP 150-180, not hypoxic.  Patient was given nitroglycerin as well and chest pain improved to 2/10 however came back to 5-6/10.  EKG showed no acute ST changes troponin level  27> 71, hemoglobin 16.1 WBC 6.8, BUN 17 creatinine 0.8 K3.7.  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  Review of Systems: As per HPI otherwise 14 point review of systems negative.    Past Medical History:  Diagnosis Date   Cataracts, bilateral 2019   Detached retina    Palpitations    Sinus trouble 2019   Stroke [facility])    TIA (transient ischemic attack)     Past Surgical History:  Procedure Laterality Date   CATARACT EXTRACTION Right    CATARACT EXTRACTION W/PHACO Left 08/25/2017   Procedure: CATARACT EXTRACTION PHACO AND INTRAOCULAR LENS PLACEMENT (IOC);  Surgeon: Galen Manila, MD;  Location: [facility] ORS;  Service: Ophthalmology;  Laterality: Left;  [place]   00:49.9 AP%   17.5 CDE   8.75 Fluid Pack Lot # [id number]   COLONOSCOPY N/A 09/04/2022   Procedure: COLONOSCOPY;  Surgeon: Jaynie Collins, DO;  Location: [facility] ENDOSCOPY;  Service: Gastroenterology;  Laterality: N/A;   EYE SURGERY     HAND SURGERY     multiple surgeries   heart monitor insertion  09/2021   RETINAL DETACHMENT SURGERY Bilateral    right x 2 and left x 2     reports that he has never smoked. He has never used smokeless tobacco. He reports that he does not drink alcohol and does not use drugs.  Allergies  Allergen Reactions   Penicillin G     Other reaction(s): Other (See Comments) Pt states that "I  almost died"; received it as an infant   Penicillins Anaphylaxis    History reviewed. No pertinent family history.   Prior to Admission medications   Medication Sig Start Date End Date Taking? Authorizing Provider  aspirin 81 MG chewable tablet Chew by mouth daily.   Yes [provider]  atorvastatin (LIPITOR) 80 MG tablet Take 1 tablet (80 mg total) by mouth at bedtime. 11/04/21 10/08/23 Yes Delfino Lovett, MD  brimonidine (ALPHAGAN) 0.2 % ophthalmic solution Place 1 drop into both eyes 2 (two) times daily. 07/31/23  Yes  [provider]  cholecalciferol (VITAMIN D3) 25 MCG (1000 UNIT) tablet Take 1,000 Units by mouth daily as needed.   Yes [provider]  Coenzyme Q10 (CO Q 10 PO) Take 1 capsule by mouth daily as needed.   Yes [provider]  ibuprofen (ADVIL) 200 MG tablet Take 400 mg by mouth every 6 (six) hours as needed for moderate pain.   Yes [provider]  Multiple Vitamins-Minerals (MULTIVITAMIN ADULT) CHEW Chew 1 tablet by mouth daily as needed.   Yes [provider]  Omega-3 Fatty Acids (FISH OIL PO) Take 1 capsule by mouth daily as needed.   Yes [provider]  omeprazole (PRILOSEC OTC) 20 MG tablet Take 1 tablet (20 mg total) by mouth daily. 10/08/23 10/07/24 Yes Pilar Jarvis, MD    Physical Exam: Vitals:   10/08/23 4098 10/08/23 0645 10/08/23 0802 10/08/23 0831  BP:   (!) 185/89   Pulse: (!) 51 (!) 50 (!) 56   Resp: 11 10 17    Temp:    98 F (36.7 C)  TempSrc:    Oral  SpO2: 100% 100% 100%   Weight:      Height:        Constitutional: NAD, calm, comfortable Vitals:   10/08/23 0637 10/08/23 0645 10/08/23 0802 10/08/23 0831  BP:   (!) 185/89   Pulse: (!) 51 (!) 50 (!) 56   Resp: 11 10 17    Temp:    98 F (36.7 C)  TempSrc:    Oral  SpO2: 100% 100% 100%   Weight:      Height:       Eyes: PERRL, lids and conjunctivae normal ENMT: Mucous membranes are moist. Posterior pharynx clear of any exudate or lesions.Normal dentition.  Neck: normal, supple, no masses, no thyromegaly Respiratory: clear to auscultation bilaterally, no wheezing, no crackles. Normal respiratory effort. No accessory muscle use.  Cardiovascular: Regular rate and rhythm, no murmurs / rubs / gallops. No extremity edema. 2+ pedal pulses. No carotid bruits.  Abdomen: no tenderness, no masses palpated. No hepatosplenomegaly. Bowel sounds positive.  Musculoskeletal: no clubbing / cyanosis. No joint deformity upper and lower extremities. Good ROM, no contractures.  Normal muscle tone.  Skin: no rashes, lesions, ulcers. No induration Neurologic: CN 2-12 grossly intact. Sensation intact, DTR normal. Strength 5/5 in all 4.  Psychiatric: Normal judgment and insight. Alert and oriented x 3. Normal mood.     Labs on Admission: I have personally reviewed following labs and imaging studies  CBC: Recent Labs  Lab 10/08/23 0445  WBC 6.8  HGB 16.1  HCT 47.4  MCV 90.1  PLT 181   Basic Metabolic Panel: Recent Labs  Lab 10/08/23 0445  NA 138  K 3.7  CL 104  CO2 27  GLUCOSE 107*  BUN 17  CREATININE 0.82  CALCIUM 9.4   GFR: Estimated Creatinine Clearance: 92.2 mL/min (by C-G formula based on SCr of  0.82 mg/dL). Liver Function Tests: Recent Labs  Lab 10/08/23 0445  AST 21  ALT 25  ALKPHOS 63  BILITOT 1.3*  PROT 7.3  ALBUMIN 4.5   Recent Labs  Lab 10/08/23 0445  LIPASE 65*   No results for input(s): "AMMONIA" in the last 168 hours. Coagulation Profile: No results for input(s): "INR", "PROTIME" in the last 168 hours. Cardiac Enzymes: No results for input(s): "CKTOTAL", "CKMB", "CKMBINDEX", "TROPONINI" in the last 168 hours. BNP (last 3 results) No results for input(s): "PROBNP" in the last 8760 hours. HbA1C: No results for input(s): "HGBA1C" in the last 72 hours. CBG: No results for input(s): "GLUCAP" in the last 168 hours. Lipid Profile: No results for input(s): "CHOL", "HDL", "LDLCALC", "TRIG", "CHOLHDL", "LDLDIRECT" in the last 72 hours. Thyroid Function Tests: No results for input(s): "TSH", "T4TOTAL", "FREET4", "T3FREE", "THYROIDAB" in the last 72 hours. Anemia Panel: No results for input(s): "VITAMINB12", "FOLATE", "FERRITIN", "TIBC", "IRON", "RETICCTPCT" in the last 72 hours. Urine analysis:    Component Value Date/Time   COLORURINE YELLOW (A) 11/03/2021 1828   APPEARANCEUR CLEAR (A) 11/03/2021 1828   LABSPEC 1.021 11/03/2021 1828   PHURINE 6.0 11/03/2021 1828   GLUCOSEU NEGATIVE 11/03/2021 1828   HGBUR NEGATIVE  11/03/2021 1828   BILIRUBINUR NEGATIVE 11/03/2021 1828   KETONESUR NEGATIVE 11/03/2021 1828   PROTEINUR NEGATIVE 11/03/2021 1828   NITRITE NEGATIVE 11/03/2021 1828   LEUKOCYTESUR NEGATIVE 11/03/2021 1828    Radiological Exams on Admission: CT Angio Chest/Abd/Pel for Dissection W and/or Wo Contrast Result Date: 10/08/2023 CLINICAL DATA:  Central chest pain for 1 hour. Acute aortic syndrome suspected. EXAM: CT ANGIOGRAPHY CHEST, ABDOMEN AND PELVIS TECHNIQUE: Non-contrast CT of the chest was initially obtained. Multidetector CT imaging through the chest, abdomen and pelvis was performed using the standard protocol during bolus administration of intravenous contrast. Multiplanar reconstructed images and MIPs were obtained and reviewed to evaluate the vascular anatomy. RADIATION DOSE REDUCTION: This exam was performed according to the departmental dose-optimization program which includes automated exposure control, adjustment of the mA and/or kV according to patient size and/or use of iterative reconstruction technique. CONTRAST:  100mL OMNIPAQUE IOHEXOL 350 MG/ML SOLN COMPARISON:  None Available. FINDINGS: CTA CHEST FINDINGS Cardiovascular: Pre contrast imaging shows no hyperdense crescent in the wall of the thoracic aorta to suggest the presence of an acute intramural hematoma. Ascending thoracic aorta measures 3.8 cm diameter. Imaging after IV contrast administration shows no dissection of the thoracic aorta. Arterial aortic arch branch vessel anatomy is widely patent. Mild atherosclerotic calcification evident in the thoracic aorta. Mediastinum/Nodes: No mediastinal lymphadenopathy. There is no hilar lymphadenopathy. The esophagus has normal imaging features. There is no axillary lymphadenopathy. Lungs/Pleura: 11 x 9 mm lesion in the left lower lobe (image 111/6) mimics blood pool enhancement on postcontrast imaging and appears to be unenhanced on precontrast imaging (43/4). While there is no obvious  enlarged draining pulmonary vein, pulmonary AVM or peripheral saccular pulmonary arterial aneurysm could have this appearance. No focal airspace consolidation. No pleural effusion. Musculoskeletal: No worrisome lytic or sclerotic osseous abnormality. Review of the MIP images confirms the above findings. CTA ABDOMEN AND PELVIS FINDINGS VASCULAR Aorta: Normal caliber aorta without aneurysm, dissection, vasculitis or significant stenosis. Moderate atherosclerosis. Celiac: Patent without evidence of aneurysm, dissection, vasculitis or significant stenosis. SMA: Patent without evidence of aneurysm, dissection, vasculitis or significant stenosis. Renals: Both renal arteries are patent without evidence of aneurysm, dissection, vasculitis, fibromuscular dysplasia or significant stenosis. Accessory renal arteries identified bilaterally. IMA: Patent without evidence of  aneurysm, dissection, vasculitis or significant stenosis. Inflow: Patent without evidence of aneurysm, dissection, vasculitis or significant stenosis. Veins: No obvious venous abnormality within the limitations of this arterial phase study. Review of the MIP images confirms the above findings. NON-VASCULAR Hepatobiliary: No suspicious focal abnormality within the liver parenchyma. There is no evidence for gallstones, gallbladder wall thickening, or pericholecystic fluid. No intrahepatic or extrahepatic biliary dilation. Pancreas: No focal mass lesion. No dilatation of the main duct. No intraparenchymal cyst. No peripancreatic edema. Spleen: No splenomegaly. No suspicious focal mass lesion. Adrenals/Urinary Tract: No adrenal nodule or mass. Kidneys unremarkable. No evidence for hydroureter. The urinary bladder appears normal for the degree of distention. Stomach/Bowel: Stomach is unremarkable. No gastric wall thickening. No evidence of outlet obstruction. Duodenum is normally positioned as is the ligament of Treitz. No small bowel wall thickening. No small bowel  dilatation. The terminal ileum is normal. The appendix is normal. No gross colonic mass. No colonic wall thickening. Lymphatic: There is no gastrohepatic or hepatoduodenal ligament lymphadenopathy. No retroperitoneal or mesenteric lymphadenopathy. No pelvic sidewall lymphadenopathy. Reproductive: The prostate gland and seminal vesicles are unremarkable. Other: No intraperitoneal free fluid. Musculoskeletal: Small bilateral groin hernias contain only fat. No worrisome lytic or sclerotic osseous abnormality. Review of the MIP images confirms the above findings. IMPRESSION: 1. No evidence for thoracoabdominal aortic aneurysm. No dissection of the thoracoabdominal aorta. 2. 11 x 9 mm lesion in the left lower lobe mimics blood pool enhancement on postcontrast imaging and appears to be unenhanced on precontrast imaging. While there is no obvious enlarged draining pulmonary vein, a pulmonary AVM or peripheral saccular pulmonary arterial aneurysm could have this appearance. Soft tissue pulmonary nodule is considered less likely but is a possibility. Consider follow-up in 3 months to ensure stability. 3. Small bilateral groin hernias contain only fat. 4. Aortic atherosclerosis. Electronically Signed   By: Kennith Center M.D.   On: 10/08/2023 06:11   DG Chest 2 View Result Date: 10/08/2023 CLINICAL DATA:  Chest pain.  Shortness of breath and dizziness. EXAM: CHEST - 2 VIEW COMPARISON:  None Available. FINDINGS: The lungs are clear without focal pneumonia, edema, pneumothorax or pleural effusion. The cardiopericardial silhouette is within normal limits for size. No acute bony abnormality. Telemetry leads overlie the chest. IMPRESSION: No active cardiopulmonary disease. Electronically Signed   By: Kennith Center M.D.   On: 10/08/2023 05:03    EKG: Independently reviewed.  Sinus rhythm, no significant ST changes.  Assessment/Plan Principal Problem:   NSTEMI (non-ST elevated myocardial infarction) (HCC)  (please populate  well all problems here in Problem List. (For example, if patient is on BP meds at home and you resume or decide to hold them, it is a problem that needs to be her. 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  Patient and family made aware.  Elevated lipase level - Unclear etiology, low suspicion for acute pancreatitis and imaging study does not support acute findings on pancreas. Repeat lipase level in AM.  HTN - Start lisinopril  HLD - Continue atorvastatin  DVT prophylaxis: Heparin drip Code Status: \Full code Family Communication: Wife at bedside Disposition Plan: Patient sick with NSTEMI requiring inpatient cardiology consultation and likely inpatient ischemia workup,  expect more than 2 midnight hospital stay Consults called: [facility] cardiology Admission status: Tele admit   [place] MD [facility] Pager [phone]  10/08/2023, 9:52 AM

## 2023-10-08 NOTE — Discharge Instructions (Addendum)
 Fortunately

## 2023-10-09 ENCOUNTER — Encounter: Payer: Self-pay | Admitting: Internal Medicine

## 2023-10-09 ENCOUNTER — Other Ambulatory Visit

## 2023-10-09 ENCOUNTER — Encounter
Admission: EM | Disposition: A | Discharge status: Home / Self Care | Payer: Self-pay | Source: Home / Self Care | Attending: Internal Medicine

## 2023-10-09 DIAGNOSIS — I214 Non-ST elevation (NSTEMI) myocardial infarction: Secondary | ICD-10-CM | POA: Diagnosis not present

## 2023-10-09 HISTORY — PX: LEFT HEART CATH AND CORONARY ANGIOGRAPHY: CATH118249

## 2023-10-09 LAB — HEPARIN LEVEL (UNFRACTIONATED): Heparin Unfractionated: 0.32 [IU]/mL (ref 0.30–0.70)

## 2023-10-09 LAB — CBC
HCT: 43 % (ref 39.0–52.0)
Hemoglobin: 14.9 g/dL (ref 13.0–17.0)
MCH: 29.9 pg (ref 26.0–34.0)
MCHC: 34.7 g/dL (ref 30.0–36.0)
MCV: 86.3 fL (ref 80.0–100.0)
Platelets: 156 10*3/uL (ref 150–400)
RBC: 4.98 MIL/uL (ref 4.22–5.81)
RDW: 12.1 % (ref 11.5–15.5)
WBC: 7.8 10*3/uL (ref 4.0–10.5)
nRBC: 0 % (ref 0.0–0.2)

## 2023-10-09 LAB — LIPASE, BLOOD: Lipase: 47 U/L (ref 11–51)

## 2023-10-09 SURGERY — LEFT HEART CATH AND CORONARY ANGIOGRAPHY
Anesthesia: Moderate Sedation

## 2023-10-09 MED ORDER — LIDOCAINE HCL (PF) 1 % IJ SOLN
INTRAMUSCULAR | Status: DC | PRN
  Administered 2023-10-09: 2 mL

## 2023-10-09 MED ORDER — LIDOCAINE HCL 1 % IJ SOLN
INTRAMUSCULAR | Status: AC
  Filled 2023-10-09: qty 20

## 2023-10-09 MED ORDER — IOHEXOL 300 MG/ML  SOLN
INTRAMUSCULAR | Status: DC | PRN
  Administered 2023-10-09: 100 mL

## 2023-10-09 MED ORDER — AMLODIPINE BESYLATE 5 MG PO TABS
5.0000 mg | ORAL_TABLET | Freq: Every day | ORAL | Status: DC
  Administered 2023-10-09: 5 mg via ORAL
  Filled 2023-10-09: qty 1

## 2023-10-09 MED ORDER — HEPARIN (PORCINE) IN NACL 1000-0.9 UT/500ML-% IV SOLN
INTRAVENOUS | Status: DC | PRN
  Administered 2023-10-09: 1000 mL

## 2023-10-09 MED ORDER — MIDAZOLAM HCL 2 MG/2ML IJ SOLN
INTRAMUSCULAR | Status: DC | PRN
  Administered 2023-10-09: 1 mg via INTRAVENOUS

## 2023-10-09 MED ORDER — METOPROLOL SUCCINATE ER 25 MG PO TB24
25.0000 mg | ORAL_TABLET | Freq: Every day | ORAL | 1 refills | Status: DC
Start: 2023-10-10 — End: 2023-12-17

## 2023-10-09 MED ORDER — METOPROLOL SUCCINATE ER 25 MG PO TB24
25.0000 mg | ORAL_TABLET | Freq: Every day | ORAL | Status: DC
  Administered 2023-10-09: 25 mg via ORAL
  Filled 2023-10-09: qty 1

## 2023-10-09 MED ORDER — HEPARIN SODIUM (PORCINE) 1000 UNIT/ML IJ SOLN
INTRAMUSCULAR | Status: DC | PRN
Start: 2023-10-09 — End: 2023-10-09
  Administered 2023-10-09: 3500 [IU] via INTRAVENOUS

## 2023-10-09 MED ORDER — AMLODIPINE BESYLATE 5 MG PO TABS: 5.0000 mg | ORAL_TABLET | Freq: Every day | ORAL | 1 refills | Status: DC

## 2023-10-09 MED ORDER — HEPARIN SODIUM (PORCINE) 1000 UNIT/ML IJ SOLN
INTRAMUSCULAR | Status: AC
  Filled 2023-10-09: qty 10

## 2023-10-09 MED ORDER — ISOSORBIDE MONONITRATE ER 30 MG PO TB24: 30.0000 mg | ORAL_TABLET | Freq: Every day | ORAL | 1 refills | Status: AC

## 2023-10-09 MED ORDER — NITROGLYCERIN 0.4 MG SL SUBL: 0.4000 mg | SUBLINGUAL_TABLET | SUBLINGUAL | 1 refills | Status: AC | PRN

## 2023-10-09 MED ORDER — SODIUM CHLORIDE 0.9 % WEIGHT BASED INFUSION: 1.0000 mL/kg/h | INTRAVENOUS | Status: DC

## 2023-10-09 MED ORDER — VERAPAMIL HCL 2.5 MG/ML IV SOLN
INTRAVENOUS | Status: AC
  Filled 2023-10-09: qty 2

## 2023-10-09 MED ORDER — VERAPAMIL HCL 2.5 MG/ML IV SOLN
INTRAVENOUS | Status: DC | PRN
  Administered 2023-10-09: 2.5 mg via INTRA_ARTERIAL

## 2023-10-09 MED ORDER — FENTANYL CITRATE (PF) 100 MCG/2ML IJ SOLN
INTRAMUSCULAR | Status: AC
  Filled 2023-10-09: qty 2

## 2023-10-09 MED ORDER — FENTANYL CITRATE (PF) 100 MCG/2ML IJ SOLN
INTRAMUSCULAR | Status: DC | PRN
  Administered 2023-10-09: 25 ug via INTRAVENOUS

## 2023-10-09 MED ORDER — MIDAZOLAM HCL 2 MG/2ML IJ SOLN
INTRAMUSCULAR | Status: AC
  Filled 2023-10-09: qty 2

## 2023-10-09 MED ORDER — ASPIRIN 325 MG PO TBEC: 325.0000 mg | DELAYED_RELEASE_TABLET | Freq: Every day | ORAL | 1 refills | Status: AC

## 2023-10-09 MED ORDER — HEPARIN (PORCINE) IN NACL 1000-0.9 UT/500ML-% IV SOLN
INTRAVENOUS | Status: AC
  Filled 2023-10-09: qty 1000

## 2023-10-09 MED ORDER — ISOSORBIDE MONONITRATE ER 30 MG PO TB24
30.0000 mg | ORAL_TABLET | Freq: Every day | ORAL | Status: DC
  Administered 2023-10-09: 30 mg via ORAL
  Filled 2023-10-09 (×2): qty 1

## 2023-10-09 SURGICAL SUPPLY — 10 items
CATH 5FR JL3.5 JR4 ANG PIG MP (CATHETERS) IMPLANT
DEVICE RAD TR BAND REGULAR (VASCULAR PRODUCTS) IMPLANT
DRAPE BRACHIAL (DRAPES) IMPLANT
GLIDESHEATH SLEND SS 6F .021 (SHEATH) IMPLANT
GUIDEWIRE INQWIRE 1.5J.035X260 (WIRE) IMPLANT
INQWIRE 1.5J .035X260CM (WIRE) ×1 IMPLANT
PACK CARDIAC CATH (CUSTOM PROCEDURE TRAY) ×1 IMPLANT
PROTECTION STATION PRESSURIZED (MISCELLANEOUS) ×1 IMPLANT
SET ATX-X65L (MISCELLANEOUS) IMPLANT
STATION PROTECTION PRESSURIZED (MISCELLANEOUS) IMPLANT

## 2023-10-09 NOTE — Progress Notes (Signed)
 PHARMACY - ANTICOAGULATION CONSULT NOTE  Pharmacy Consult for heparin  infusion Indication: chest pain/ACS  Allergies  Allergen Reactions   Penicillins Anaphylaxis    Pt states that "I almost died"; received it as an infant    Patient Measurements: Height: 5\' 9"  (175.3 cm) Weight: 83.1 kg (183 lb 1.6 oz) IBW/kg (Calculated) : 70.7 HEPARIN  DW (KG): 83  Vital Signs: Temp: 99.3 F (37.4 C) (04/18 0422) Temp Source: Temporal (04/17 2059) BP: 140/62 (04/18 0422) Pulse Rate: 61 (04/18 0422)  Labs: Recent Labs    10/08/23 0445 10/08/23 0645 10/08/23 0928 10/08/23 1000 10/08/23 1606 10/08/23 2312 10/09/23 0528  HGB 16.1  --   --   --   --   --  14.9  HCT 47.4  --   --   --   --   --  43.0  PLT 181  --   --   --   --   --  156  APTT  --   --  25  --   --   --   --   LABPROT  --   --  14.8  --   --   --   --   INR  --   --  1.1  --   --   --   --   HEPARINUNFRC  --   --   --   --  0.24* 0.39 0.32  CREATININE 0.82  --   --   --   --   --   --   TROPONINIHS 27* 71*  --  251*  --   --   --    Estimated Creatinine Clearance: 92.2 mL/min (by C-G formula based on SCr of 0.82 mg/dL).  Medical History: Past Medical History:  Diagnosis Date   Cataracts, bilateral 2019   Detached retina    Palpitations    Sinus trouble 2019   Stroke [facility])    TIA (transient ischemic attack)    Assessment: 63 y.o. male with medical history significant of cryptogenic stroke, HTN, HLD, presented with worsening of chest pain. He is on no chronic anticoagulation prior to arrival.   Baseline Labs: H&H, PLT wnl, INR, aPTT pending  Goal of Therapy:  Heparin  level 0.3-0.7 units/ml Monitor platelets by anticoagulation protocol: Yes  Labs: 0417 1606 HL 0.24 - subtherapeutic on 1100 units/hr 0417 2312 HL 0.39, therapeutic x 1 0418 0528 HL 0.32, therapeutic x 2 but trending down   Plan:  Continue heparin  infusion at 1250 units/hr  Recheck HL at 1600 to reconfirm, then daily if still  therapeutic Monitor CBC daily while on heparin    Coretta Dexter, PharmD, [facility] 10/09/2023 6:37 AM

## 2023-10-09 NOTE — Plan of Care (Signed)

## 2023-10-09 NOTE — Discharge Summary (Signed)
 Physician Discharge Summary   Patient: Chris Shelton MRN: [id number] DOB: 01-09-1961  Admit date:     10/08/2023  Discharge date: 10/09/2023  Discharge Physician: Montey Apa   PCP: Little Riff, MD   Recommendations at discharge:  {Tip this will not be part of the note when signed- Example include specific recommendations for outpatient follow-up, pending tests to follow-up on. (Optional):26781}  Follow up with Cardiology in 1-2 weeks  Discharge Diagnoses: Principal Problem:   NSTEMI (non-ST elevated myocardial infarction) (HCC)  Resolved Problems:   * No resolved hospital problems. [facility] Course: No notes on file  Assessment and Plan: No notes have been filed under this hospital service. Service: Hospitalist     {Tip this will not be part of the note when signed Body mass index is 27.04 kg/m. , ,  (Optional):26781}  {(NOTE) Pain control PDMP Statment (Optional):26782} Consultants: *** Procedures performed: ***  Disposition: {Plan; Disposition:26390} Diet recommendation:  Discharge Diet Orders (From admission, onward)     Start     Ordered   10/09/23 0000  Diet - low sodium heart healthy        10/09/23 1649           {Diet_Plan:26776} DISCHARGE MEDICATION: Allergies as of 10/09/2023       Reactions   Penicillins Anaphylaxis   Pt states that "I almost died"; received it as an infant        Medication List     STOP taking these medications    aspirin  81 MG chewable tablet Replaced by: aspirin  EC 325 MG tablet       TAKE these medications    amLODipine  5 MG tablet Commonly known as: NORVASC  Take 1 tablet (5 mg total) by mouth daily. Start taking on: October 10, 2023   aspirin  EC 325 MG tablet Take 1 tablet (325 mg total) by mouth daily. Replaces: aspirin  81 MG chewable tablet   atorvastatin  80 MG tablet Commonly known as: LIPITOR  Take 1 tablet (80 mg total) by mouth at bedtime.   brimonidine  0.2 % ophthalmic  solution Commonly known as: ALPHAGAN  Place 1 drop into both eyes 2 (two) times daily.   cholecalciferol 25 MCG (1000 UNIT) tablet Commonly known as: VITAMIN D3 Take 1,000 Units by mouth daily as needed.   CO Q 10 PO Take 1 capsule by mouth daily as needed.   FISH OIL PO Take 1 capsule by mouth daily as needed.   ibuprofen 200 MG tablet Commonly known as: ADVIL Take 400 mg by mouth every 6 (six) hours as needed for moderate pain.   isosorbide  mononitrate 30 MG 24 hr tablet Commonly known as: IMDUR  Take 1 tablet (30 mg total) by mouth daily. Start taking on: October 10, 2023   metoprolol  succinate 25 MG 24 hr tablet Commonly known as: TOPROL -XL Take 1 tablet (25 mg total) by mouth daily. Start taking on: October 10, 2023   Multivitamin Adult Chew Chew 1 tablet by mouth daily as needed.   nitroGLYCERIN  0.4 MG SL tablet Commonly known as: NITROSTAT  Place 1 tablet (0.4 mg total) under the tongue every 5 (five) minutes as needed for chest pain.   omeprazole  20 MG tablet Commonly known as: PriLOSEC  OTC Take 1 tablet (20 mg total) by mouth daily.        Follow-up Information     [facility]   Contact information: [address] [place] [address] [phone]  Little Riff, MD .   Specialty: Internal Medicine Contact information: [phone] [facility] [address] [facility] [place] [address] [phone]         Anna Kettering, PA-C. Go in 1 week(s).   Specialty: Cardiology Contact information: [address] [place] [address] [phone]                Discharge Exam: Cleavon Curls Weights   10/08/23 0439 10/08/23 2155  Weight: 83 kg 83.1 kg   ***  Condition at discharge: {DC Condition:26389}  The results of significant diagnostics from this hospitalization (including imaging, microbiology, ancillary and laboratory) are listed below for reference.   Imaging Studies: CT Angio Chest/Abd/Pel for Dissection W and/or Wo  Contrast Result Date: 10/08/2023 CLINICAL DATA:  Central chest pain for 1 hour. Acute aortic syndrome suspected. EXAM: CT ANGIOGRAPHY CHEST, ABDOMEN AND PELVIS TECHNIQUE: Non-contrast CT of the chest was initially obtained. Multidetector CT imaging through the chest, abdomen and pelvis was performed using the standard protocol during bolus administration of intravenous contrast. Multiplanar reconstructed images and MIPs were obtained and reviewed to evaluate the vascular anatomy. RADIATION DOSE REDUCTION: This exam was performed according to the departmental dose-optimization program which includes automated exposure control, adjustment of the mA and/or kV according to patient size and/or use of iterative reconstruction technique. CONTRAST:  OMNIPAQUE  IOHEXOL  350 MG/ML SOLN COMPARISON:  None Available. FINDINGS: CTA CHEST FINDINGS Cardiovascular: Pre contrast imaging shows no hyperdense crescent in the wall of the thoracic aorta to suggest the presence of an acute intramural hematoma. Ascending thoracic aorta measures 3.8 cm diameter. Imaging after IV contrast administration shows no dissection of the thoracic aorta. Arterial aortic arch branch vessel anatomy is widely patent. Mild atherosclerotic calcification evident in the thoracic aorta. Mediastinum/Nodes: No mediastinal lymphadenopathy. There is no hilar lymphadenopathy. The esophagus has normal imaging features. There is no axillary lymphadenopathy. Lungs/Pleura: 11 x 9 mm lesion in the left lower lobe (image 111/6) mimics blood pool enhancement on postcontrast imaging and appears to be unenhanced on precontrast imaging (43/4). While there is no obvious enlarged draining pulmonary vein, pulmonary AVM or peripheral saccular pulmonary arterial aneurysm could have this appearance. No focal airspace consolidation. No pleural effusion. Musculoskeletal: No worrisome lytic or sclerotic osseous abnormality. Review of the MIP images confirms the above findings.  CTA ABDOMEN AND PELVIS FINDINGS VASCULAR Aorta: Normal caliber aorta without aneurysm, dissection, vasculitis or significant stenosis. Moderate atherosclerosis. Celiac: Patent without evidence of aneurysm, dissection, vasculitis or significant stenosis. SMA: Patent without evidence of aneurysm, dissection, vasculitis or significant stenosis. Renals: Both renal arteries are patent without evidence of aneurysm, dissection, vasculitis, fibromuscular dysplasia or significant stenosis. Accessory renal arteries identified bilaterally. IMA: Patent without evidence of aneurysm, dissection, vasculitis or significant stenosis. Inflow: Patent without evidence of aneurysm, dissection, vasculitis or significant stenosis. Veins: No obvious venous abnormality within the limitations of this arterial phase study. Review of the MIP images confirms the above findings. NON-VASCULAR Hepatobiliary: No suspicious focal abnormality within the liver parenchyma. There is no evidence for gallstones, gallbladder wall thickening, or pericholecystic fluid. No intrahepatic or extrahepatic biliary dilation. Pancreas: No focal mass lesion. No dilatation of the main duct. No intraparenchymal cyst. No peripancreatic edema. Spleen: No splenomegaly. No suspicious focal mass lesion. Adrenals/Urinary Tract: No adrenal nodule or mass. Kidneys unremarkable. No evidence for hydroureter. The urinary bladder appears normal for the degree of distention. Stomach/Bowel: Stomach is unremarkable. No gastric wall thickening. No evidence of outlet obstruction. Duodenum is normally positioned as is the  ligament of Treitz. No small bowel wall thickening. No small bowel dilatation. The terminal ileum is normal. The appendix is normal. No gross colonic mass. No colonic wall thickening. Lymphatic: There is no gastrohepatic or hepatoduodenal ligament lymphadenopathy. No retroperitoneal or mesenteric lymphadenopathy. No pelvic sidewall lymphadenopathy. Reproductive: The  prostate gland and seminal vesicles are unremarkable. Other: No intraperitoneal free fluid. Musculoskeletal: Small bilateral groin hernias contain only fat. No worrisome lytic or sclerotic osseous abnormality. Review of the MIP images confirms the above findings. IMPRESSION: 1. No evidence for thoracoabdominal aortic aneurysm. No dissection of the thoracoabdominal aorta. 2. 11 x 9 mm lesion in the left lower lobe mimics blood pool enhancement on postcontrast imaging and appears to be unenhanced on precontrast imaging. While there is no obvious enlarged draining pulmonary vein, a pulmonary AVM or peripheral saccular pulmonary arterial aneurysm could have this appearance. Soft tissue pulmonary nodule is considered less likely but is a possibility. Consider follow-up in 3 months to ensure stability. 3. Small bilateral groin hernias contain only fat. 4. Aortic atherosclerosis. Electronically Signed   By: Donnal Fusi M.D.   On: 10/08/2023 06:11   DG Chest 2 View Result Date: 10/08/2023 CLINICAL DATA:  Chest pain.  Shortness of breath and dizziness. EXAM: CHEST - 2 VIEW COMPARISON:  None Available. FINDINGS: The lungs are clear without focal pneumonia, edema, pneumothorax or pleural effusion. The cardiopericardial silhouette is within normal limits for size. No acute bony abnormality. Telemetry leads overlie the chest. IMPRESSION: No active cardiopulmonary disease. Electronically Signed   By: Donnal Fusi M.D.   On: 10/08/2023 05:03    Microbiology: Results for orders placed or performed during the hospital encounter of 07/09/21  Resp Panel by RT-PCR (Flu A&B, Covid) Nasopharyngeal Swab     Status: None   Collection Time: 07/10/21 12:57 AM   Specimen: Nasopharyngeal Swab; Nasopharyngeal(NP) swabs in vial transport medium  Result Value Ref Range Status   SARS Coronavirus 2 by RT PCR NEGATIVE NEGATIVE Final    Comment: (NOTE) SARS-CoV-2 target nucleic acids are NOT DETECTED.  The SARS-CoV-2 RNA is  generally detectable in upper respiratory specimens during the acute phase of infection. The lowest concentration of SARS-CoV-2 viral copies this assay can detect is 138 copies/mL. A negative result does not preclude SARS-Cov-2 infection and should not be used as the sole basis for treatment or other patient management decisions. A negative result may occur with  improper specimen collection/handling, submission of specimen other than nasopharyngeal swab, presence of viral mutation(s) within the areas targeted by this assay, and inadequate number of viral copies(<138 copies/mL). A negative result must be combined with clinical observations, patient history, and epidemiological information. The expected result is Negative.  Fact Sheet for Patients:  BloggerCourse.com  Fact Sheet for Healthcare Providers:  SeriousBroker.it  This test is no t yet approved or cleared by the [place]  FDA and  has been authorized for detection and/or diagnosis of SARS-CoV-2 by FDA under an Emergency Use Authorization (EUA). This EUA will remain  in effect (meaning this test can be used) for the duration of the COVID-19 declaration under Section 564(b)(1) of the Act, 21 U.S.C.section 360bbb-3(b)(1), unless the authorization is terminated  or revoked sooner.       Influenza A by PCR NEGATIVE NEGATIVE Final   Influenza B by PCR NEGATIVE NEGATIVE Final    Comment: (NOTE) The Xpert Xpress SARS-CoV-2/FLU/RSV plus assay is intended as an aid in the diagnosis of influenza from Nasopharyngeal swab specimens and should not be used  as a sole basis for treatment. Nasal washings and aspirates are unacceptable for Xpert Xpress SARS-CoV-2/FLU/RSV testing.  Fact Sheet for Patients: BloggerCourse.com  Fact Sheet for Healthcare Providers: SeriousBroker.it  This test is not yet approved or cleared by the [place] FDA and has been authorized for detection and/or diagnosis of SARS-CoV-2 by FDA under an Emergency Use Authorization (EUA). This EUA will remain in effect (meaning this test can be used) for the duration of the COVID-19 declaration under Section 564(b)(1) of the Act, 21 U.S.C. section 360bbb-3(b)(1), unless the authorization is terminated or revoked.  Performed at [facility], [address] [place], [place] [address]     Labs: CBC: Recent Labs  Lab 10/08/23 0445 10/09/23 0528  WBC 6.8 7.8  HGB 16.1 14.9  HCT 47.4 43.0  MCV 90.1 86.3  PLT 181 156   Basic Metabolic Panel: Recent Labs  Lab 10/08/23 0445  NA 138  K 3.7  CL 104  CO2 27  GLUCOSE 107*  BUN 17  CREATININE 0.82  CALCIUM  9.4   Liver Function Tests: Recent Labs  Lab 10/08/23 0445  AST 21  ALT 25  ALKPHOS 63  BILITOT 1.3*  PROT 7.3  ALBUMIN 4.5   CBG: No results for input(s): "GLUCAP" in the last 168 hours.  Discharge time spent: {LESS THAN/GREATER XBJY:78295} 30 minutes.  Signed: Montey Apa, DO [facility] 10/09/2023

## 2023-10-09 NOTE — CV Procedure (Addendum)
 Brief cath note Inpatient left heart cath Right radial approach  Left ventriculogram showed normal left ventricular function EF of 60%  Coronaries Left main large minor irregularities LAD large proximal complex bifurcation lesion appears to be moderate disease in the LAD moderate to severe disease in ostium of diagonal mild calcification  Circumflex is large OM1 small with significant ostial disease Left dominant system  RCA nondominant moderate disease  Intervention deferred Recommend initial trial of medical therapy  Imdur  30 once a day Metoprolol  succinate 25 daily Lipitor  80 mg daily Aspirin  325 daily Consider switching from losartan  to amlodipine  5 mg daily  Safe for discharge later today Have the patient follow-up with cardiology 1 to 2 weeks We will consider whether complex intervention will be warranted based on his response to medical therapy  Full cath note to follow  [facility]

## 2023-10-09 NOTE — Progress Notes (Signed)
 Transition of Care [facility]) - Inpatient Brief Assessment   Patient Details  Name: Chris Shelton MRN: [id number] Date of Birth: [birth year]  Transition of Care [facility]) CM/SW Contact:    Chaunce Winkels C Eliceo Gladu, RN Phone Number: 10/09/2023, 3:22 PM   Clinical Narrative: TOC continuing to follow patient's progress throughout discharge planning.   Transition of Care Asessment: Insurance and Status: Insurance coverage has been reviewed Patient has primary care physician: Yes   Prior level of function:: Independent Prior/Current Home Services: No current home services Social Drivers of Health Review: SDOH reviewed no interventions necessary Readmission risk has been reviewed: Yes Transition of care needs: no transition of care needs at this time

## 2023-10-09 NOTE — Progress Notes (Signed)
 [facility] CLINIC CARDIOLOGY PROGRESS NOTE   Patient ID: Chris Shelton MRN: [id number] DOB/AGE: 11-16-1960 63 y.o.  Admit date: 10/08/2023 Referring Physician Dr. Jeane Miguel Primary Physician Little Riff, MD  Primary Cardiologist Azalia Bolk, PA-C ([facility]) Reason for Consultation AF RVR, HFpEF   HPI: Chris Shelton is a 63 y.o. male  with a past medical history of hypertension, hyperlipidemia, sinus bradycardia, s/p ILR implant ([employer] Lux from 09/20/2021), hx of CVA, hx basilar artery stenosis with infarction who presented to the ED on 10/08/2023 for substernal chest pain with radiation to his back while walking to the bathroom at 3 AM. Cardiology was consulted for further evaluation.    Patient states he reported to the ED after having an episode of chest pain and high BP at 200/100 early this morning at [facility] after getting up to go to the bathroom. Patient states he had another episode of chest pain and high blood pressure 2 nights ago after working out that lasted about 15 minutes, that relieved with resting and belching. Patient says for years he has a"pinching" feeling on his chest and abdomen that is associated with exertion, high BP and gas that is usually relieved with belching. The episode that occurred early this morning lasted longer that usual, about one hour and states he wasn't able to burp like normal so his chest pain persisted. Given this he decided to come to the ED. Workup in the ED notable for na 138, K 3.7, Cr 0.82, Hgb 16.1, platelets 181. Troponins elevated and trending 27 > 71 > 251. EKG in ED showed sinus bradycardia, rate 56 bpm, with no significant ST changes. CT angio showed no evidence of thoracoabdominal aortic aneurysm and revealed aortic atherosclerosis. CXR showed no active cardiopulmonary disease. Patient received ASA, SL nitroglycerin , Pepcid  that provided minimal relief. IV heparin  gtt was started.  Interval History:  -Patient seen and examined at bedside, resting  comfortably. No events overnight. Denies chest pain, shortness of breath, palpitations, diaphoresis, syncope. He is ready to proceed with left heart cath today.  Review of systems complete and found to be negative unless listed above    Vitals:   10/08/23 2155 10/08/23 2325 10/09/23 0422 10/09/23 0736  BP:  126/64 (!) 140/62 139/66  Pulse:  65 61 66  Resp:  (!) 21 18 20   Temp:  97.8 F (36.6 C) 99.3 F (37.4 C) 98.9 F (37.2 C)  TempSrc:    Oral  SpO2:  99% 98% 97%  Weight: 83.1 kg     Height:         Intake/Output Summary (Last 24 hours) at 10/09/2023 1014 Last data filed at 10/08/2023 2300 Gross per 24 hour  Intake 243 ml  Output --  Net 243 ml     PHYSICAL EXAM General: awake, well nourished, in no acute distress. HEENT: Normocephalic and atraumatic. Neck: No JVD.  Lungs: Normal respiratory effort. Clear bilaterally to auscultation. No wheezes, crackles, rhonchi.  Heart: HRRR. Normal S1 and S2 without gallops or murmurs. Radial & DP pulses 2+ bilaterally. Abdomen: Non-distended appearing.  Msk: Normal strength and tone for age. Extremities: No clubbing, cyanosis or edema.   Neuro: Alert and oriented X 3. Psych: Mood appropriate, affect congruent.    LABS: Basic Metabolic Panel: Recent Labs    10/08/23 0445  NA 138  K 3.7  CL 104  CO2 27  GLUCOSE 107*  BUN 17  CREATININE 0.82  CALCIUM  9.4   Liver Function Tests: Recent Labs  10/08/23 0445  AST 21  ALT 25  ALKPHOS 63  BILITOT 1.3*  PROT 7.3  ALBUMIN 4.5   Recent Labs    10/08/23 0445 10/09/23 0528  LIPASE 65* 47   CBC: Recent Labs    10/08/23 0445 10/09/23 0528  WBC 6.8 7.8  HGB 16.1 14.9  HCT 47.4 43.0  MCV 90.1 86.3  PLT 181 156   Cardiac Enzymes: Recent Labs    10/08/23 0445 10/08/23 0645 10/08/23 1000  TROPONINIHS 27* 71* 251*   BNP: No results for input(s): "BNP" in the last 72 hours. D-Dimer: No results for input(s): "DDIMER" in the last 72 hours. Hemoglobin  A1C: No results for input(s): "HGBA1C" in the last 72 hours. Fasting Lipid Panel: No results for input(s): "CHOL", "HDL", "LDLCALC", "TRIG", "CHOLHDL", "LDLDIRECT" in the last 72 hours. Thyroid Function Tests: No results for input(s): "TSH", "T4TOTAL", "T3FREE", "THYROIDAB" in the last 72 hours.  Invalid input(s): "FREET3" Anemia Panel: No results for input(s): "VITAMINB12", "FOLATE", "FERRITIN", "TIBC", "IRON", "RETICCTPCT" in the last 72 hours.  CT Angio Chest/Abd/Pel for Dissection W and/or Wo Contrast Result Date: 10/08/2023 CLINICAL DATA:  Central chest pain for 1 hour. Acute aortic syndrome suspected. EXAM: CT ANGIOGRAPHY CHEST, ABDOMEN AND PELVIS TECHNIQUE: Non-contrast CT of the chest was initially obtained. Multidetector CT imaging through the chest, abdomen and pelvis was performed using the standard protocol during bolus administration of intravenous contrast. Multiplanar reconstructed images and MIPs were obtained and reviewed to evaluate the vascular anatomy. RADIATION DOSE REDUCTION: This exam was performed according to the departmental dose-optimization program which includes automated exposure control, adjustment of the mA and/or kV according to patient size and/or use of iterative reconstruction technique. CONTRAST:  OMNIPAQUE  IOHEXOL  350 MG/ML SOLN COMPARISON:  None Available. FINDINGS: CTA CHEST FINDINGS Cardiovascular: Pre contrast imaging shows no hyperdense crescent in the wall of the thoracic aorta to suggest the presence of an acute intramural hematoma. Ascending thoracic aorta measures 3.8 cm diameter. Imaging after IV contrast administration shows no dissection of the thoracic aorta. Arterial aortic arch branch vessel anatomy is widely patent. Mild atherosclerotic calcification evident in the thoracic aorta. Mediastinum/Nodes: No mediastinal lymphadenopathy. There is no hilar lymphadenopathy. The esophagus has normal imaging features. There is no axillary lymphadenopathy.  Lungs/Pleura: 11 x 9 mm lesion in the left lower lobe (image 111/6) mimics blood pool enhancement on postcontrast imaging and appears to be unenhanced on precontrast imaging (43/4). While there is no obvious enlarged draining pulmonary vein, pulmonary AVM or peripheral saccular pulmonary arterial aneurysm could have this appearance. No focal airspace consolidation. No pleural effusion. Musculoskeletal: No worrisome lytic or sclerotic osseous abnormality. Review of the MIP images confirms the above findings. CTA ABDOMEN AND PELVIS FINDINGS VASCULAR Aorta: Normal caliber aorta without aneurysm, dissection, vasculitis or significant stenosis. Moderate atherosclerosis. Celiac: Patent without evidence of aneurysm, dissection, vasculitis or significant stenosis. SMA: Patent without evidence of aneurysm, dissection, vasculitis or significant stenosis. Renals: Both renal arteries are patent without evidence of aneurysm, dissection, vasculitis, fibromuscular dysplasia or significant stenosis. Accessory renal arteries identified bilaterally. IMA: Patent without evidence of aneurysm, dissection, vasculitis or significant stenosis. Inflow: Patent without evidence of aneurysm, dissection, vasculitis or significant stenosis. Veins: No obvious venous abnormality within the limitations of this arterial phase study. Review of the MIP images confirms the above findings. NON-VASCULAR Hepatobiliary: No suspicious focal abnormality within the liver parenchyma. There is no evidence for gallstones, gallbladder wall thickening, or pericholecystic fluid. No intrahepatic or extrahepatic biliary dilation. Pancreas: No focal mass  lesion. No dilatation of the main duct. No intraparenchymal cyst. No peripancreatic edema. Spleen: No splenomegaly. No suspicious focal mass lesion. Adrenals/Urinary Tract: No adrenal nodule or mass. Kidneys unremarkable. No evidence for hydroureter. The urinary bladder appears normal for the degree of distention.  Stomach/Bowel: Stomach is unremarkable. No gastric wall thickening. No evidence of outlet obstruction. Duodenum is normally positioned as is the ligament of Treitz. No small bowel wall thickening. No small bowel dilatation. The terminal ileum is normal. The appendix is normal. No gross colonic mass. No colonic wall thickening. Lymphatic: There is no gastrohepatic or hepatoduodenal ligament lymphadenopathy. No retroperitoneal or mesenteric lymphadenopathy. No pelvic sidewall lymphadenopathy. Reproductive: The prostate gland and seminal vesicles are unremarkable. Other: No intraperitoneal free fluid. Musculoskeletal: Small bilateral groin hernias contain only fat. No worrisome lytic or sclerotic osseous abnormality. Review of the MIP images confirms the above findings. IMPRESSION: 1. No evidence for thoracoabdominal aortic aneurysm. No dissection of the thoracoabdominal aorta. 2. 11 x 9 mm lesion in the left lower lobe mimics blood pool enhancement on postcontrast imaging and appears to be unenhanced on precontrast imaging. While there is no obvious enlarged draining pulmonary vein, a pulmonary AVM or peripheral saccular pulmonary arterial aneurysm could have this appearance. Soft tissue pulmonary nodule is considered less likely but is a possibility. Consider follow-up in 3 months to ensure stability. 3. Small bilateral groin hernias contain only fat. 4. Aortic atherosclerosis. Electronically Signed   By: Donnal Fusi M.D.   On: 10/08/2023 06:11   DG Chest 2 View Result Date: 10/08/2023 CLINICAL DATA:  Chest pain.  Shortness of breath and dizziness. EXAM: CHEST - 2 VIEW COMPARISON:  None Available. FINDINGS: The lungs are clear without focal pneumonia, edema, pneumothorax or pleural effusion. The cardiopericardial silhouette is within normal limits for size. No acute bony abnormality. Telemetry leads overlie the chest. IMPRESSION: No active cardiopulmonary disease. Electronically Signed   By: Donnal Fusi M.D.    On: 10/08/2023 05:03      [employer] ILR (reviewed on 03/31/2023 with Azalia Bolk, PA-C): No dysrhythmias documented since device implant. No true atrial fibrillation has been documented since device implant   ECHO ordered.   TELEMETRY reviewed by me 10/08/2023: Sinus bradycardia, rate 50s   EKG reviewed by me: Sinus bradycardia, rate 56 bpm, with no significant ST changes.   Data reviewed by me 10/08/2023: last 24h vitals tele labs imaging I/O ED provider note, admission H&P   ASSESSMENT AND PLAN:  Principal Problem:   NSTEMI (non-ST elevated myocardial infarction) (HCC)   Chris Shelton is a 63 y.o. male  with a past medical history of hypertension, hyperlipidemia, sinus bradycardia, cardiac device in situ ([employer] Lux ILR from 09/20/2021), hx of CVA, hx basilar artery stenosis with infarction who presented to the ED on 10/08/2023 for substernal chest pain with radiation to his back while walking to the bathroom at 3 AM. Cardiology was consulted for further evaluation.    # Hypertension # Hyperlipidemia # NSTEMI, mildly elevated troponins Patient reports to the ED due to chest discomfort and elevated BP that lasted about 1 hour after getting up at 3AM to go to bathroom. Patient has a history of chest discomfort  and episodic elevated BP that normally lasts shorter period of time. Patient reports his chest discomfort is usually associated with gas and belching relieves his chest pain. Patient denies SOB, palpitations, dizziness, syncope. Patient reports minimal chest pain relief s/p ASA, SL nitroglycerin , Pepcid . At the time of my  eval this AM pt states he still has mild chest discomfort. At this time patient was hypertensive and now BP is improving. HR is stable. Troponins 27 > 71 > 251. -Echo ordered  -Continue IV heparin  gtt.  -Continue ASA 81 mg and atorvastatin  80 mg daily. -Continue losartan  50 mg daily. -Defer adding GDMT beta blocker for now due to patients  baseline bradycardia -Discussed the risks and benefits of proceeding with LHC for further evaluation with the patient.  He is agreeable to proceed.  NPO after midnight until LHC this afternoon (10/09/23) with Dr. Beau Bound.  Written consent will be obtained.  Further recommendations following LHC.     TIMI Risk Score for Unstable Angina or Non-ST Elevation MI:   The patient's TIMI risk score is 4, which indicates a 20% risk of all cause mortality, new or recurrent myocardial infarction or need for urgent revascularization in the next 14 days.   Dagmar Adcox, DO  10/09/2023, 10:14 AM [facility]

## 2023-10-12 ENCOUNTER — Encounter: Payer: Self-pay | Admitting: Internal Medicine

## 2023-10-12 LAB — CARDIAC CATHETERIZATION: Cath EF Quantitative: 60 %

## 2023-12-08 ENCOUNTER — Telehealth (HOSPITAL_COMMUNITY): Payer: Self-pay

## 2023-12-08 NOTE — Telephone Encounter (Signed)
 Outside/paper referral received by Dr. Ronold Colder from [facility]. Spoke with patient, he said the referral needs to go to [place] as he is unable to drive to [place].  Faxing referral to [facility] in [place].

## 2023-12-17 ENCOUNTER — Encounter: Attending: Internal Medicine | Admitting: *Deleted

## 2023-12-17 ENCOUNTER — Encounter: Payer: Self-pay | Admitting: *Deleted

## 2023-12-17 DIAGNOSIS — Z951 Presence of aortocoronary bypass graft: Secondary | ICD-10-CM

## 2023-12-17 DIAGNOSIS — I214 Non-ST elevation (NSTEMI) myocardial infarction: Secondary | ICD-10-CM

## 2023-12-17 NOTE — Progress Notes (Signed)
 Virtual orientation call completed today. he has an appointment on 12/21/2023 for EP eval and gym Orientation.  Documentation of diagnosis can be found in [facility] 11/20/2023 .

## 2023-12-18 ENCOUNTER — Encounter: Payer: Self-pay | Admitting: Internal Medicine

## 2023-12-18 ENCOUNTER — Other Ambulatory Visit: Payer: Self-pay

## 2023-12-18 ENCOUNTER — Observation Stay
Admission: EM | Admit: 2023-12-18 | Discharge: 2023-12-19 | Disposition: A | Discharge status: Home / Self Care | Attending: Internal Medicine | Admitting: Internal Medicine

## 2023-12-18 DIAGNOSIS — R079 Chest pain, unspecified: Principal | ICD-10-CM | POA: Diagnosis present

## 2023-12-18 DIAGNOSIS — Z79899 Other long term (current) drug therapy: Secondary | ICD-10-CM | POA: Insufficient documentation

## 2023-12-18 DIAGNOSIS — E785 Hyperlipidemia, unspecified: Secondary | ICD-10-CM | POA: Insufficient documentation

## 2023-12-18 DIAGNOSIS — R0789 Other chest pain: Secondary | ICD-10-CM | POA: Diagnosis present

## 2023-12-18 DIAGNOSIS — Z8673 Personal history of transient ischemic attack (TIA), and cerebral infarction without residual deficits: Secondary | ICD-10-CM | POA: Diagnosis not present

## 2023-12-18 DIAGNOSIS — I10 Essential (primary) hypertension: Secondary | ICD-10-CM | POA: Insufficient documentation

## 2023-12-18 DIAGNOSIS — Z951 Presence of aortocoronary bypass graft: Secondary | ICD-10-CM | POA: Insufficient documentation

## 2023-12-18 LAB — CBC
HCT: 39.8 % (ref 39.0–52.0)
Hemoglobin: 12.8 g/dL — ABNORMAL LOW (ref 13.0–17.0)
MCH: 28.4 pg (ref 26.0–34.0)
MCHC: 32.2 g/dL (ref 30.0–36.0)
MCV: 88.2 fL (ref 80.0–100.0)
Platelets: 212 10*3/uL (ref 150–400)
RBC: 4.51 MIL/uL (ref 4.22–5.81)
RDW: 12.4 % (ref 11.5–15.5)
WBC: 7 10*3/uL (ref 4.0–10.5)
nRBC: 0 % (ref 0.0–0.2)

## 2023-12-18 LAB — TROPONIN I (HIGH SENSITIVITY)
Troponin I (High Sensitivity): 16 ng/L (ref <18)
Troponin I (High Sensitivity): 28 ng/L — ABNORMAL HIGH (ref <18)
Troponin I (High Sensitivity): 64 ng/L — ABNORMAL HIGH (ref <18)
Troponin I (High Sensitivity): 83 ng/L — ABNORMAL HIGH (ref <18)
Troponin I (High Sensitivity): 97 ng/L — ABNORMAL HIGH (ref <18)
Troponin I (High Sensitivity): 105 ng/L (ref <18)
Troponin I (High Sensitivity): 118 ng/L (ref <18)
Troponin I (High Sensitivity): 102 ng/L (ref <18)

## 2023-12-18 LAB — CBC WITH DIFFERENTIAL/PLATELET
Abs Immature Granulocytes: 0.03 10*3/uL (ref 0.00–0.07)
Basophils Absolute: 0.1 10*3/uL (ref 0.0–0.1)
Basophils Relative: 1 %
Eosinophils Absolute: 0.3 10*3/uL (ref 0.0–0.5)
Eosinophils Relative: 3 %
HCT: 36.8 % — ABNORMAL LOW (ref 39.0–52.0)
Hemoglobin: 12.1 g/dL — ABNORMAL LOW (ref 13.0–17.0)
Immature Granulocytes: 0 %
Lymphocytes Relative: 23 %
Lymphs Abs: 1.7 10*3/uL (ref 0.7–4.0)
MCH: 28.3 pg (ref 26.0–34.0)
MCHC: 32.9 g/dL (ref 30.0–36.0)
MCV: 86.2 fL (ref 80.0–100.0)
Monocytes Absolute: 0.5 10*3/uL (ref 0.1–1.0)
Monocytes Relative: 7 %
Neutro Abs: 4.9 10*3/uL (ref 1.7–7.7)
Neutrophils Relative %: 66 %
Platelets: 238 10*3/uL (ref 150–400)
RBC: 4.27 MIL/uL (ref 4.22–5.81)
RDW: 12.6 % (ref 11.5–15.5)
WBC: 7.6 10*3/uL (ref 4.0–10.5)
nRBC: 0 % (ref 0.0–0.2)

## 2023-12-18 LAB — COMPREHENSIVE METABOLIC PANEL WITH GFR
ALT: 23 U/L (ref 0–44)
AST: 21 U/L (ref 15–41)
Albumin: 3.9 g/dL (ref 3.5–5.0)
Alkaline Phosphatase: 78 U/L (ref 38–126)
Anion gap: 11 (ref 5–15)
BUN: 18 mg/dL (ref 8–23)
CO2: 24 mmol/L (ref 22–32)
Calcium: 9.3 mg/dL (ref 8.9–10.3)
Chloride: 105 mmol/L (ref 98–111)
Creatinine, Ser: 0.76 mg/dL (ref 0.61–1.24)
GFR, Estimated: >60 mL/min (ref >60)
Glucose, Bld: 120 mg/dL — ABNORMAL HIGH (ref 70–99)
Potassium: 3.9 mmol/L (ref 3.5–5.1)
Sodium: 140 mmol/L (ref 135–145)
Total Bilirubin: 1.2 mg/dL (ref 0.0–1.2)
Total Protein: 7 g/dL (ref 6.5–8.1)

## 2023-12-18 LAB — LIPASE, BLOOD: Lipase: 58 U/L — ABNORMAL HIGH (ref 11–51)

## 2023-12-18 LAB — PROTIME-INR
INR: 1.1 (ref 0.8–1.2)
Prothrombin Time: 14.7 s (ref 11.4–15.2)

## 2023-12-18 LAB — CREATININE, SERUM
Creatinine, Ser: 0.57 mg/dL — ABNORMAL LOW (ref 0.61–1.24)
GFR, Estimated: >60 mL/min (ref >60)

## 2023-12-18 MED ORDER — ATORVASTATIN CALCIUM 80 MG PO TABS
80.0000 mg | ORAL_TABLET | Freq: Every day | ORAL | Status: DC
  Administered 2023-12-18: 80 mg via ORAL
  Filled 2023-12-18: qty 1

## 2023-12-18 MED ORDER — CLOPIDOGREL BISULFATE 75 MG PO TABS
75.0000 mg | ORAL_TABLET | Freq: Every day | ORAL | Status: DC
  Administered 2023-12-18 – 2023-12-19 (×2): 75 mg via ORAL
  Filled 2023-12-18 (×2): qty 1

## 2023-12-18 MED ORDER — MORPHINE SULFATE (PF) 4 MG/ML IV SOLN
4.0000 mg | Freq: Once | INTRAVENOUS | Status: AC
  Administered 2023-12-18: 4 mg via INTRAVENOUS
  Filled 2023-12-18: qty 1

## 2023-12-18 MED ORDER — ONDANSETRON HCL 4 MG/2ML IJ SOLN: 4.0000 mg | Freq: Four times a day (QID) | INTRAMUSCULAR | Status: DC | PRN

## 2023-12-18 MED ORDER — ALUM & MAG HYDROXIDE-SIMETH 200-200-20 MG/5ML PO SUSP
30.0000 mL | Freq: Once | ORAL | Status: AC
  Administered 2023-12-18: 30 mL via ORAL
  Filled 2023-12-18: qty 30

## 2023-12-18 MED ORDER — FAMOTIDINE IN NACL 20-0.9 MG/50ML-% IV SOLN
20.0000 mg | Freq: Once | INTRAVENOUS | Status: AC
  Administered 2023-12-18: 20 mg via INTRAVENOUS
  Filled 2023-12-18: qty 50

## 2023-12-18 MED ORDER — METOPROLOL TARTRATE 25 MG PO TABS
12.5000 mg | ORAL_TABLET | Freq: Every day | ORAL | Status: DC
  Filled 2023-12-18: qty 1

## 2023-12-18 MED ORDER — ACETAMINOPHEN 325 MG PO TABS: 650.0000 mg | ORAL_TABLET | ORAL | Status: DC | PRN

## 2023-12-18 MED ORDER — HEPARIN SODIUM (PORCINE) 5000 UNIT/ML IJ SOLN
5000.0000 [IU] | Freq: Three times a day (TID) | INTRAMUSCULAR | Status: DC
  Administered 2023-12-18 – 2023-12-19 (×3): 5000 [IU] via SUBCUTANEOUS
  Filled 2023-12-18 (×3): qty 1

## 2023-12-18 MED ORDER — NITROGLYCERIN 0.4 MG SL SUBL: 0.4000 mg | SUBLINGUAL_TABLET | SUBLINGUAL | Status: DC | PRN

## 2023-12-18 MED ORDER — ASPIRIN 81 MG PO TBEC
81.0000 mg | DELAYED_RELEASE_TABLET | Freq: Every day | ORAL | Status: DC
  Administered 2023-12-18 – 2023-12-19 (×2): 81 mg via ORAL
  Filled 2023-12-18 (×2): qty 1

## 2023-12-18 MED ORDER — NITROGLYCERIN 0.4 MG SL SUBL
0.4000 mg | SUBLINGUAL_TABLET | SUBLINGUAL | Status: DC | PRN
  Administered 2023-12-18 (×3): 0.4 mg via SUBLINGUAL
  Filled 2023-12-18 (×2): qty 1

## 2023-12-18 NOTE — Plan of Care (Signed)

## 2023-12-18 NOTE — ED Provider Notes (Signed)
 Care assumed of patient from outgoing provider.  See their note for initial history, exam and plan.  Clinical Course as of 12/18/23 0931  Fri Dec 18, 2023  0609 Patient having chest pain again.  SBP back to 150s.  Giving third NTG, repeating EKG. no significant changes between the 2 EKGs.  Patient says that the pain (feels more like indigestion. [CF]  [phone] The only abnormality on the labs is a very minimal elevation of lipase of 58, but he has no significant tenderness palpation of the epigastrium and I think the pancreatitis is extremely unlikely. [CF]  9298 Transferring ED care to Dr. Suzanne to reassess after repeat troponin.  High-risk nature of his chest pain is concerning; leaning towards admission for inpatient cardiology consult for possible unstable angina. [CF]  0705 CABG 4 weeks ago, no chest pain since CABG, new chest pain woke up last night. Nitroglycerin  x 2 and improved, EKG w/o ischemia.  Initial trop neg. Admit for high risk chest pain, second trop pending.  [SM]  9241 Discussed with cardiology Dr. Florencio who recommended treatment for acid reflux.  Did not feel that the patient needed heparin  at this time given only mild elevation of troponin.  Ongoing symptoms of acid reflux so we will repeat an EKG.  Cardiology will come to evaluate the patient in the ER. [SM]  0930 Patient had another episode of chest pain after having a bowel movement and exerting himself back to bed.  Repeat EKG without findings of acute ischemia.  Reach back out to cardiology who recommended admission for observation.  Consulted hospitalist for admission for observation.  Second troponin is timed for 930. [SM]    Clinical Course User Index [CF] Gordan Huxley, MD [SM] Suzanne Kirsch, MD  Initial troponin negative, second troponin elevated at 28.  Will start on heparin  and consult hospitalist for admission.  Chest pain-free at this time   Suzanne Kirsch, MD 12/18/23 [phone]

## 2023-12-18 NOTE — ED Notes (Signed)
Pt given cola 

## 2023-12-18 NOTE — ED Triage Notes (Signed)
 Patient C/O chest pain and diaphoresis that began about thirty minutes ago. Patient had a CABG about a month ago at [facility].

## 2023-12-18 NOTE — ED Provider Notes (Signed)
 [facility] Provider Note    Event Date/Time   First MD Initiated Contact with Patient 12/18/23 [phone]     (approximate)   History   Chest Pain   HPI Chris Shelton is a 63 y.o. male whose medical history is notable for a three-vessel CABG 4 weeks ago at [facility], referred by Dr. Florencio.  He has been doing well and not having any chest pain until this morning, for which he presents to the emergency department.  Patient reports that he woke up just prior to arrival with severe right-sided sharp stabbing chest pain, which he reports is very similar to what he felt about 2 months ago when he had a heart attack.  He does not regularly have pain since the surgery and has never had to take any nitroglycerin .  Given the severity of the pain and he was not sure where the nitroglycerin  was so his wife brought him immediately to the emergency department.  He denies shortness of breath, nausea, vomiting, and abdominal pain.  The pain does not radiate to his jaw or to his arms.  Nothing in particular makes it better or worse     Physical Exam   ED Triage Vitals  Encounter Vitals Group     BP 12/18/23 0418 (!) 169/85     Girls Systolic BP Percentile --      Girls Diastolic BP Percentile --      Boys Systolic BP Percentile --      Boys Diastolic BP Percentile --      Pulse Rate 12/18/23 0420 63     Resp 12/18/23 0420 (!) 21     Temp --      Temp src --      SpO2 12/18/23 0420 100 %     Weight 12/18/23 0420 83 kg (183 lb)     Height --      Head Circumference --      Peak Flow --      Pain Score --      Pain Loc --      Pain Education --      Exclude from Growth Chart --       Most recent vital signs: Vitals:   12/18/23 0450 12/18/23 0630  BP: 102/60 (!) 112/59  Pulse: (!) 58 (!) 47  Resp: 11 11  SpO2: 97% 98%    General: Awake, appears to be in pain and very uncomfortable but nontoxic. CV:  Good peripheral perfusion.  Normal heart sounds, regular rate  and rhythm.  Well-appearing sternotomy scar.  Mild discomfort to palpation of the anterior chest wall. Resp:  Normal effort. Speaking easily and comfortably, no accessory muscle usage nor intercostal retractions.  Lungs clear to auscultation. Abd:  No distention.  No tenderness to palpation of the abdomen.  No pulsatile abdominal masses.   ED Results / Procedures / Treatments   Labs (all labs ordered are listed, but only abnormal results are displayed) Labs Reviewed  COMPREHENSIVE METABOLIC PANEL WITH GFR - Abnormal; Notable for the following components:      Result Value   Glucose, Bld 120 (*)    All other components within normal limits  CBC WITH DIFFERENTIAL/PLATELET - Abnormal; Notable for the following components:   Hemoglobin 12.1 (*)    HCT 36.8 (*)    All other components within normal limits  LIPASE, BLOOD - Abnormal; Notable for the following components:   Lipase 58 (*)    All other  components within normal limits  TROPONIN I (HIGH SENSITIVITY) - Abnormal; Notable for the following components:   Troponin I (High Sensitivity) 28 (*)    All other components within normal limits  PROTIME-INR  TROPONIN I (HIGH SENSITIVITY)     EKG  ED ECG REPORT #1 I, Darleene Dome, the attending physician, personally viewed and interpreted this ECG.  Date: 12/18/2023 EKG Time: 4:20 AM Rate: 65 Rhythm: normal sinus rhythm QRS Axis: normal Intervals: Right bundle branch block ST/T Wave abnormalities: Non-specific ST segment / T-wave changes, but no clear evidence of acute ischemia.   Narrative Interpretation: no definitive evidence of acute ischemia; does not meet STEMI criteria.   ED ECG REPORT #2 I, Darleene Dome, the attending physician, personally viewed and interpreted this ECG.  Date: 12/18/2023 EKG Time: 6:09 AM Rate: 58 Rhythm: Sinus bradycardia QRS Axis: normal Intervals: Right bundle branch block ST/T Wave abnormalities: Non-specific ST segment / T-wave changes, but  no clear evidence of acute ischemia. Narrative Interpretation: no definitive evidence of acute ischemia; does not meet STEMI criteria.  No significant change from prior     PROCEDURES:  Critical Care performed: No  .1-3 Lead EKG Interpretation  Performed by: Dome Darleene, MD Authorized by: Dome Darleene, MD     Interpretation: normal     ECG rate:  64   ECG rate assessment: normal     Rhythm: sinus rhythm     Ectopy: none     Conduction: normal       IMPRESSION / MDM / ASSESSMENT AND PLAN / ED COURSE  I reviewed the triage vital signs and the nursing notes.                              Differential diagnosis includes, but is not limited to, ACS, Dressler syndrome, papillary muscle rupture, failure of bypass graft, less likely acute infection or PE.  Patient's presentation is most consistent with acute presentation with potential threat to life or bodily function.  Labs/studies ordered: CBC with differential, lipase, pro time-INR, high-sensitivity troponin, CMP, EKG  Interventions/Medications given:  Medications  nitroGLYCERIN  (NITROSTAT ) SL tablet 0.4 mg (0.4 mg Sublingual Given 12/18/23 0610)  morphine  (PF) 4 MG/ML injection 4 mg (4 mg Intravenous Given 12/18/23 0434)    (Note:  hospital course my include additional interventions and/or labs/studies not listed above.)   Patient clearly having pain.  After reviewing EKG, I ordered nitroglycerin  and morphine  4 mg IV.  Low suspicion for right ventricular MI.  Proceeding with standard workup based on EKG not meeting STEMI criteria.  The patient is on the cardiac monitor to evaluate for evidence of arrhythmia and/or significant heart rate changes.   Clinical Course as of 12/18/23 0754  Fri Dec 18, 2023  0609 Patient having chest pain again.  SBP back to 150s.  Giving third NTG, repeating EKG. no significant changes between the 2 EKGs.  Patient says that the pain (feels more like indigestion. [CF]  [phone] The only  abnormality on the labs is a very minimal elevation of lipase of 58, but he has no significant tenderness palpation of the epigastrium and I think the pancreatitis is extremely unlikely. [CF]  9298 Transferring ED care to Dr. Suzanne to reassess after repeat troponin.  High-risk nature of his chest pain is concerning; leaning towards admission for inpatient cardiology consult for possible unstable angina. [CF]  0705 CABG 4 weeks ago, no chest pain since CABG, new chest  pain woke up last night. Nitroglycerin  x 2 and improved, EKG w/o ischemia.  Initial trop neg. Admit for high risk chest pain, second trop pending.  [SM]    Clinical Course User Index [CF] Gordan Huxley, MD [SM] Suzanne Kirsch, MD     FINAL CLINICAL IMPRESSION(S) / ED DIAGNOSES   Final diagnoses:  Chest pain, unspecified type     Rx / DC Orders   ED Discharge Orders     None        Note:  This document was prepared using Dragon voice recognition software and may include unintentional dictation errors.   Gordan Huxley, MD 12/18/23 [phone]

## 2023-12-18 NOTE — H&P (Signed)
 History and Physical    Chris Shelton FMW:[id number] DOB: 26-Oct-1960 DOA: 12/18/2023  PCP: Rudolpho Norleen BIRCH, MD  Patient coming from: home  I have personally briefly reviewed patient's old medical records in [facility]  Chief Complaint: chest pain   HPI: Chris Shelton is a 63 y.o. male with medical history significant of  Hypertension, HLD , CAD s/p CABG  5/30,CVA ( 4/25), migraine who presents to ED with complaint of chest pain that woke him up from sleep with severe right sided sharp stabbing chest pain. He states this pain is similar to the pain he felt when he had his heart attack. He states since having surgery he has not had cardiac chest pain. He notes no associated n/v/d/ sob/ diaphoresis or presyncope or abdominal pain or gerd like symptoms. He currently states that s/p treatment with gi cocktail in ED his symptoms improved. He has no complaints currently.  ED Course:  Afeb bp 169,85, Hr 65, sat 100% rr21 EKG:nsr RBBB Wbc 7.6, hgb 12.1, plt 238 Na 140, K 3.9, Cl 105, glu 120, cr 0.76  CE 16 ,28,64 Lipase 58  nitroglycerin  SL, morphine  4mg  . Famotidine  20mg  , maalox    Review of Systems: As per HPI otherwise 10 point review of systems negative.   Past Medical History:  Diagnosis Date   Cataracts, bilateral 2019   Detached retina    Palpitations    Sinus trouble 2019   Stroke [facility])    TIA (transient ischemic attack)     Past Surgical History:  Procedure Laterality Date   CATARACT EXTRACTION Right    CATARACT EXTRACTION W/PHACO Left 08/25/2017   Procedure: CATARACT EXTRACTION PHACO AND INTRAOCULAR LENS PLACEMENT (IOC);  Surgeon: Jaye Fallow, MD;  Location: [facility] ORS;  Service: Ophthalmology;  Laterality: Left;  US    00:49.9 AP%   17.5 CDE   8.75 Fluid Pack Lot # [id number]   COLONOSCOPY N/A 09/04/2022   Procedure: COLONOSCOPY;  Surgeon: Onita Elspeth Sharper, DO;  Location: [facility] ENDOSCOPY;  Service: Gastroenterology;  Laterality: N/A;   EYE SURGERY     HAND  SURGERY     multiple surgeries   heart monitor insertion  09/2021   LEFT HEART CATH AND CORONARY ANGIOGRAPHY N/A 10/09/2023   Procedure: LEFT HEART CATH AND CORONARY ANGIOGRAPHY;  Surgeon: Florencio Cara BIRCH, MD;  Location: [facility] INVASIVE CV LAB;  Service: Cardiovascular;  Laterality: N/A;   RETINAL DETACHMENT SURGERY Bilateral    right x 2 and left x 2     reports that he has never smoked. He has never used smokeless tobacco. He reports that he does not drink alcohol and does not use drugs.  Allergies  Allergen Reactions   Penicillins Anaphylaxis    Pt states that I almost died; received it as an infant     No family history on file.  Prior to Admission medications   Medication Sig Start Date End Date Taking? Authorizing Provider  amLODipine  (NORVASC ) 5 MG tablet Take 1 tablet (5 mg total) by mouth daily. Patient not taking: Reported on 12/17/2023 10/10/23   Fausto Sor A, DO  atorvastatin  (LIPITOR ) 80 MG tablet Take 80 mg by mouth at bedtime. 12/04/23   [provider]  brimonidine  (ALPHAGAN ) 0.2 % ophthalmic solution Place 1 drop into both eyes 2 (two) times daily. 07/31/23   [provider]  cholecalciferol (VITAMIN D3) 25 MCG (1000 UNIT) tablet Take 1,000 Units by mouth daily as needed.    [provider]  Coenzyme Q10 (CO Q 10 PO) Take 1 capsule by mouth daily as needed.    [provider]  ibuprofen (ADVIL) 200 MG tablet Take 400 mg by mouth every 6 (six) hours as needed for moderate pain. Patient not taking: Reported on 12/17/2023    [provider]  isosorbide  mononitrate (IMDUR ) 30 MG 24 hr tablet Take 1 tablet (30 mg total) by mouth daily. Patient not taking: Reported on 12/17/2023 10/10/23   Fausto Sor A, DO  metoprolol  tartrate (LOPRESSOR ) 25 MG tablet Take 12.5 mg by mouth daily. 11/23/23 11/22/24  [provider]  Multiple Vitamins-Minerals (MULTIVITAMIN ADULT) CHEW Chew 1 tablet by mouth daily as needed.    [provider]  nitroGLYCERIN  (NITROSTAT ) 0.4 MG SL tablet Place 1 tablet (0.4 mg total) under the tongue every 5 (five) minutes as needed for chest pain. 10/09/23   Fausto Sor LABOR, DO  Omega-3 Fatty Acids (FISH OIL PO) Take 1 capsule by mouth daily as needed.    [provider]  omeprazole  (PRILOSEC  OTC) 20 MG tablet Take 1 tablet (20 mg total) by mouth daily. Patient not taking: Reported on 12/17/2023 10/08/23 10/07/24  Cyrena Mylar, MD    Physical Exam: Vitals:   12/18/23 0630 12/18/23 0720 12/18/23 0800 12/18/23 0811  BP: (!) 112/59  124/65   Pulse: (!) 47  (!) 47   Resp: 11  13   Temp:    97.9 F (36.6 C)  TempSrc:    Oral  SpO2: 98% 100% 92%   Weight:        Constitutional: NAD, calm, comfortable Vitals:   12/18/23 0630 12/18/23 0720 12/18/23 0800 12/18/23 0811  BP: (!) 112/59  124/65   Pulse: (!) 47  (!) 47   Resp: 11  13   Temp:    97.9 F (36.6 C)  TempSrc:    Oral  SpO2: 98% 100% 92%   Weight:       Eyes: PERRL, lids and conjunctivae normal ENMT: Mucous membranes are moist. Posterior pharynx clear of any exudate or lesions.Normal dentition.  Neck: normal, supple, no masses, no thyromegaly Respiratory: clear to auscultation bilaterally, no wheezing, no crackles. Normal respiratory effort. No accessory muscle use.  Cardiovascular: Regular rate and rhythm, no murmurs / rubs / gallops. No extremity edema. 2+ pedal pulses.  Abdomen: no tenderness, no masses palpated. No hepatosplenomegaly. Bowel sounds positive.  Musculoskeletal: no clubbing / cyanosis. No joint deformity upper and lower extremities. Good ROM, no contractures. Normal muscle tone.  Skin: no rashes, lesions, ulcers. No induration Neurologic: CN grossly intact. Sensation intact, . Strength 5/5 in all 4.  Psychiatric: Normal judgment and insight. Alert and oriented x 3. Normal mood.    Labs on Admission: I have personally reviewed following labs and imaging studies  CBC: Recent Labs  Lab  12/18/23 0424  WBC 7.6  NEUTROABS 4.9  HGB 12.1*  HCT 36.8*  MCV 86.2  PLT 238   Basic Metabolic Panel: Recent Labs  Lab 12/18/23 0424  NA 140  K 3.9  CL 105  CO2 24  GLUCOSE 120*  BUN 18  CREATININE 0.76  CALCIUM  9.3   GFR: Estimated Creatinine Clearance: 94.5 mL/min (by C-G formula based on SCr of 0.76 mg/dL). Liver Function Tests: Recent Labs  Lab 12/18/23 0424  AST 21  ALT 23  ALKPHOS 78  BILITOT 1.2  PROT 7.0  ALBUMIN 3.9   Recent Labs  Lab 12/18/23 0424  LIPASE 58*   No results for  input(s): AMMONIA in the last 168 hours. Coagulation Profile: Recent Labs  Lab 12/18/23 0424  INR 1.1   Cardiac Enzymes: No results for input(s): CKTOTAL, CKMB, CKMBINDEX, TROPONINI in the last 168 hours. BNP (last 3 results) No results for input(s): PROBNP in the last 8760 hours. HbA1C: No results for input(s): HGBA1C in the last 72 hours. CBG: No results for input(s): GLUCAP in the last 168 hours. Lipid Profile: No results for input(s): CHOL, HDL, LDLCALC, TRIG, CHOLHDL, LDLDIRECT in the last 72 hours. Thyroid Function Tests: No results for input(s): TSH, T4TOTAL, FREET4, T3FREE, THYROIDAB in the last 72 hours. Anemia Panel: No results for input(s): VITAMINB12, FOLATE, FERRITIN, TIBC, IRON, RETICCTPCT in the last 72 hours. Urine analysis:    Component Value Date/Time   COLORURINE YELLOW (A) 11/03/2021 1828   APPEARANCEUR CLEAR (A) 11/03/2021 1828   LABSPEC 1.021 11/03/2021 1828   PHURINE 6.0 11/03/2021 1828   GLUCOSEU NEGATIVE 11/03/2021 1828   HGBUR NEGATIVE 11/03/2021 1828   BILIRUBINUR NEGATIVE 11/03/2021 1828   KETONESUR NEGATIVE 11/03/2021 1828   PROTEINUR NEGATIVE 11/03/2021 1828   NITRITE NEGATIVE 11/03/2021 1828   LEUKOCYTESUR NEGATIVE 11/03/2021 1828    Radiological Exams on Admission: No results found.  EKG: Independently reviewed.   Assessment/Plan  Chest pain  r/o ACS  -noted CE  trending up , continue to monitor  -if next CE had continue upward trend will start heparin  drip  - morphine  , nitroglycerin  ,prn pain  - continue on statin, imdur , metoprolol   Hypertension -currently stable  -continue on metoprolol    HLD -continue on atorvastatin    Hx of CVA  -no deficit  -continue on secondary ppx   DVT prophylaxis:  heparin  sq Code Status: full/ as discussed per patient wishes in event of cardiac arrest  Family Communication: none at bedside Disposition Plan: patient  expected to be admitted greater than 2 midnights  Consults called:  Cardiology Dr Florencio Admission status: progressive care    Camila DELENA Ned MD [facility]   If 7PM-7AM, please contact night-coverage [email or web address] Password [email or web address]  12/18/2023, 10:14 AM

## 2023-12-18 NOTE — Progress Notes (Addendum)
 SABRA

## 2023-12-18 NOTE — ED Provider Notes (Addendum)
 Care assumed of patient from outgoing provider.  See their note for initial history, exam and plan.  Clinical Course as of 12/18/23 0931  Fri Dec 18, 2023  0609 Patient having chest pain again.  SBP back to 150s.  Giving third NTG, repeating EKG. no significant changes between the 2 EKGs.  Patient says that the pain (feels more like indigestion. [CF]  [phone] The only abnormality on the labs is a very minimal elevation of lipase of 58, but he has no significant tenderness palpation of the epigastrium and I think the pancreatitis is extremely unlikely. [CF]  9298 Transferring ED care to Dr. Suzanne to reassess after repeat troponin.  High-risk nature of his chest pain is concerning; leaning towards admission for inpatient cardiology consult for possible unstable angina. [CF]  0705 CABG 4 weeks ago, no chest pain since CABG, new chest pain woke up last night. Nitroglycerin  x 2 and improved, EKG w/o ischemia.  Initial trop neg. Admit for high risk chest pain, second trop pending.  [SM]  9241 Discussed with cardiology Dr. Florencio who recommended treatment for acid reflux.  Did not feel that the patient needed heparin  at this time given only mild elevation of troponin.  Ongoing symptoms of acid reflux so we will repeat an EKG.  Cardiology will come to evaluate the patient in the ER. [SM]  0930 Patient had another episode of chest pain after having a bowel movement and exerting himself back to bed.  Repeat EKG without findings of acute ischemia.  Reach back out to cardiology who recommended admission for observation.  Consulted hospitalist for admission for observation.  Second troponin is timed for 930. [SM]    Clinical Course User Index [CF] Gordan Huxley, MD [SM] Suzanne Kirsch, MD     Suzanne Kirsch, MD 12/18/23 9241    Suzanne Kirsch, MD 12/18/23 [phone]

## 2023-12-18 NOTE — Consult Note (Signed)
 [facility] CLINIC CARDIOLOGY CONSULT NOTE       Patient ID: Chris BLOWE MRN: [id number] DOB/AGE: 63-Oct-1962 63 y.o.  Admit date: 12/18/2023 Referring Physician Dr. Debby Primary Physician Rudolpho Norleen BIRCH, MD Primary Cardiologist Chris Roys, PA-C ([facility]) Reason for Consultation chest pain (recent CABG x 3 on 11/20/2023)  HPI: Chris Shelton is a 63 y.o. male  with a past medical history of CABG x3 ( (LIMA to LAD, SVG to OM and Diag performed with Dr. Riccardo - 05/30/2025r), hx NSTEMI (10/08/23), coronary artery disease, hx bradycardia, hypertension, hyperlipidemia,  s/p ILR implant ([employer] Lux from 09/20/2021), hx of CVA, hx basilar artery stenosis with infarction who presented to the ED on 12/18/2023 for chest pain that patient describes as similar pain compared to prior NSTEMI. Cardiology was consulted for further evaluation.   Patient presented to the ED with chest pain that started early this morning around 0400, patient states chest pain feels similar to prior NSTEMI. Work up in the ED notable for Na 140, K 3.9, Cr 0.76, Hgb 12.1, WBC 7.6, plts 238. EKG without acute ischemic changes. Trops minimally elevated 16 > 28 > 64. Patient received 3x nitroglycerin  and 1x morphine  4 mg since admission due to chest pain.  At the time of my evaluation this AM, patient was resting comfortably in ED stretcher with wife at bedside. We discussed patients sxs in further detail. Patient states around 0400 he had right sided chest pain that radiated to his right side and patient states that this pain is exactly how he felt when he had his NSTEMI in 09/2023. Patient states he arrived to ED and received 2x nitroglycerin  which resolved pain. 2 hours later patient states he had pain again that resolved with nitroglycerin  and morphine . Patient states he is unsure if it's related to indigestion or true cardiac chest pain. Patient states he was nervous when his pain felt the same as his NSTEMI. Patient  denies SOB, lightheadedness or palpitations.  Review of systems complete and found to be negative unless listed above    Past Medical History:  Diagnosis Date   Cataracts, bilateral 2019   Detached retina    Palpitations    Sinus trouble 2019   Stroke [facility])    TIA (transient ischemic attack)     Past Surgical History:  Procedure Laterality Date   CATARACT EXTRACTION Right    CATARACT EXTRACTION W/PHACO Left 08/25/2017   Procedure: CATARACT EXTRACTION PHACO AND INTRAOCULAR LENS PLACEMENT (IOC);  Surgeon: Jaye Fallow, MD;  Location: [facility] ORS;  Service: Ophthalmology;  Laterality: Left;  US    00:49.9 AP%   17.5 CDE   8.75 Fluid Pack Lot # [id number]   COLONOSCOPY N/A 09/04/2022   Procedure: COLONOSCOPY;  Surgeon: Onita Elspeth Sharper, DO;  Location: [facility] ENDOSCOPY;  Service: Gastroenterology;  Laterality: N/A;   EYE SURGERY     HAND SURGERY     multiple surgeries   heart monitor insertion  09/2021   LEFT HEART CATH AND CORONARY ANGIOGRAPHY N/A 10/09/2023   Procedure: LEFT HEART CATH AND CORONARY ANGIOGRAPHY;  Surgeon: Florencio Cara BIRCH, MD;  Location: [facility] INVASIVE CV LAB;  Service: Cardiovascular;  Laterality: N/A;   RETINAL DETACHMENT SURGERY Bilateral    right x 2 and left x 2    (Not in a hospital admission)  Social History   Socioeconomic History   Marital status: Married    Spouse name: Not on file   Number of children: Not on file   Years of  education: Not on file   Highest education level: Not on file  Occupational History   Not on file  Tobacco Use   Smoking status: Never   Smokeless tobacco: Never  Vaping Use   Vaping status: Never Used  Substance and Sexual Activity   Alcohol use: No   Drug use: Never   Sexual activity: Yes    Partners: Female  Other Topics Concern   Not on file  Social History Narrative   Not on file   Social Drivers of Health   Financial Resource Strain: Low Risk  (12/14/2023)   Received from [facility]    Overall Financial Resource Strain (CARDIA)    Difficulty of Paying Living Expenses: Not hard at all  Food Insecurity: No Food Insecurity (12/14/2023)   Received from [facility]   Hunger Vital Sign    Within the past 12 months, you worried that your food would run out before you got the money to buy more.: Never true    Within the past 12 months, the food you bought just didn't last and you didn't have money to get more.: Never true  Transportation Needs: No Transportation Needs (12/14/2023)   Received from [facility] - Transportation    In the past 12 months, has lack of transportation kept you from medical appointments or from getting medications?: No    Lack of Transportation (Non-Medical): No  Physical Activity: Not on file  Stress: Not on file  Social Connections: Not on file  Intimate Partner Violence: Not At Risk (10/08/2023)   Humiliation, Afraid, Rape, and Kick questionnaire    Fear of Current or Ex-Partner: No    Emotionally Abused: No    Physically Abused: No    Sexually Abused: No    No family history on file.   Vitals:   12/18/23 0630 12/18/23 0720 12/18/23 0800 12/18/23 0811  BP: (!) 112/59  124/65   Pulse: (!) 47  (!) 47   Resp: 11  13   Temp:    97.9 F (36.6 C)  TempSrc:    Oral  SpO2: 98% 100% 92%   Weight:        PHYSICAL EXAM General: Well appearing male, well nourished, in no acute distress. HEENT: Normocephalic and atraumatic. Neck: No JVD.   Lungs: Normal respiratory effort on room air. Clear bilaterally to auscultation. No wheezes, crackles, rhonchi.  Heart: HRRR. Normal S1 and S2 without gallops or murmurs.  Abdomen: Non-distended appearing.  Msk: Normal strength and tone for age. Extremities: Warm and well perfused. No clubbing, cyanosis, edema.  Neuro: Alert and oriented X 3. Psych: Answers questions appropriately.   Labs: Basic Metabolic Panel: Recent Labs    12/18/23 0424  NA 140  K 3.9  CL  105  CO2 24  GLUCOSE 120*  BUN 18  CREATININE 0.76  CALCIUM  9.3   Liver Function Tests: Recent Labs    12/18/23 0424  AST 21  ALT 23  ALKPHOS 78  BILITOT 1.2  PROT 7.0  ALBUMIN 3.9   Recent Labs    12/18/23 0424  LIPASE 58*   CBC: Recent Labs    12/18/23 0424  WBC 7.6  NEUTROABS 4.9  HGB 12.1*  HCT 36.8*  MCV 86.2  PLT 238   Cardiac Enzymes: Recent Labs    12/18/23 0424 12/18/23 0620 12/18/23 0947  TROPONINIHS 16 28* 64*   BNP: No results for input(s): BNP in the last 72  hours. D-Dimer: No results for input(s): DDIMER in the last 72 hours. Hemoglobin A1C: No results for input(s): HGBA1C in the last 72 hours. Fasting Lipid Panel: No results for input(s): CHOL, HDL, LDLCALC, TRIG, CHOLHDL, LDLDIRECT in the last 72 hours. Thyroid Function Tests: No results for input(s): TSH, T4TOTAL, T3FREE, THYROIDAB in the last 72 hours.  Invalid input(s): FREET3 Anemia Panel: No results for input(s): VITAMINB12, FOLATE, FERRITIN, TIBC, IRON, RETICCTPCT in the last 72 hours.   Radiology: No results found.  ECHO ordered  TELEMETRY reviewed by me 12/18/2023: sinus rhythm, rate 60s   EKG reviewed by me: sinus rhythm with RBBB, rate 54 bpm with non specific ST-T wave changes.  Data reviewed by me 12/18/2023: last 24h vitals tele labs imaging I/O ED provider note, admission H&P.  Principal Problem:   Chest pain    ASSESSMENT AND PLAN:  Chris Shelton is a 63 y.o. male  with a past medical history of CABG x3 ( (LIMA to LAD, SVG to OM and Diag performed with Dr. Riccardo - 11/20/2023), hx NSTEMI (10/08/23), coronary artery disease, hx bradycardia, hypertension, hyperlipidemia,  s/p ILR implant ([employer] Lux from 09/20/2021), hx of CVA, hx basilar artery stenosis with infarction who presented to the ED on 12/18/2023 for chest pain that patient describes as similar pain compared to prior NSTEMI. Cardiology was consulted  for further evaluation.   # Minimally elevated troponins # Chest pain # CABG x3 (LIMA to LAD, SVG to OM and Diag - 11/20/2023) # HX NSTEMI (09/2023) # Hypertension # Hyperlipidemia Patient with recent CABG x3 done on 11/20/2023 presents with chest pain that patients describes that feels similar to his last STEMI that occurred 09/2023. EKG without acute ischemic changes. Trops minimally elevated 16 > 28 > 64>83 > 97. Patient is currently without chest pain. -Echo ordered. Further recommendations pending echo. -Continue to trend troponins until peaked.  -Ordered ASA 81 mg daily and Plavix  75 mg daily. -Continue atorvastatin  80 mg daily. -Continue metoprolol  tartrate 12.5 mg daily (Patient states only takes 1x a day due to bradycardia) -Recommended Imdur , however patient states he doesn't like taking this and prefers not to. -Consider starting losartan  when BP allows.  This patient's plan of care was discussed and created with Dr. Florencio and he is in agreement.  Signed: Dorene Comfort, PA-C  12/18/2023, 11:08 AM [facility]

## 2023-12-19 DIAGNOSIS — R079 Chest pain, unspecified: Secondary | ICD-10-CM | POA: Diagnosis not present

## 2023-12-19 LAB — LIPID PANEL
Cholesterol: 94 mg/dL (ref 0–200)
HDL: 31 mg/dL — ABNORMAL LOW (ref >40)
LDL Cholesterol: 34 mg/dL (ref 0–99)
Total CHOL/HDL Ratio: 3 ratio
Triglycerides: 147 mg/dL (ref <150)
VLDL: 29 mg/dL (ref 0–40)

## 2023-12-19 MED ORDER — ASPIRIN 81 MG PO TBEC
81.0000 mg | DELAYED_RELEASE_TABLET | Freq: Every day | ORAL | 12 refills | Status: AC
Start: 2023-12-20 — End: ?

## 2023-12-19 NOTE — Progress Notes (Addendum)
 [facility] Cardiology    SUBJECTIVE: Patient states he feels reasonably well no recurrent chest pain no shortness of breath.  Feels well enough to go home.   Vitals:   12/19/23 0258 12/19/23 0809 12/19/23 0900 12/19/23 0919  BP: (!) 119/59 133/60    Pulse: (!) 51 (!) 53    Resp: 18 18 19 18   Temp: 97.8 F (36.6 C) 98.2 F (36.8 C)    TempSrc:      SpO2: 97% 100%    Weight:      Height:        No intake or output data in the 24 hours ending 12/19/23 1030    PHYSICAL EXAM  General: Well developed, well nourished, in no acute distress HEENT:  Normocephalic and atramatic Neck:  No JVD.  Lungs: Clear bilaterally to auscultation and percussion. Heart: HRRR . Normal S1 and S2 without gallops or murmurs.  Abdomen: Bowel sounds are positive, abdomen soft and non-tender  Msk:  Back normal, normal gait. Normal strength and tone for age. Extremities: No clubbing, cyanosis or edema.   Neuro: Alert and oriented X 3. Psych:  Good affect, responds appropriately   LABS: Basic Metabolic Panel: Recent Labs    12/18/23 0424 12/18/23 1053  NA 140  --   K 3.9  --   CL 105  --   CO2 24  --   GLUCOSE 120*  --   BUN 18  --   CREATININE 0.76 0.57*  CALCIUM  9.3  --    Liver Function Tests: Recent Labs    12/18/23 0424  AST 21  ALT 23  ALKPHOS 78  BILITOT 1.2  PROT 7.0  ALBUMIN 3.9   Recent Labs    12/18/23 0424  LIPASE 58*   CBC: Recent Labs    12/18/23 0424 12/18/23 1053  WBC 7.6 7.0  NEUTROABS 4.9  --   HGB 12.1* 12.8*  HCT 36.8* 39.8  MCV 86.2 88.2  PLT 238 212   Cardiac Enzymes: No results for input(s): CKTOTAL, CKMB, CKMBINDEX, TROPONINI in the last 72 hours. BNP: Invalid input(s): POCBNP D-Dimer: No results for input(s): DDIMER in the last 72 hours. Hemoglobin A1C: No results for input(s): HGBA1C in the last 72 hours. Fasting Lipid Panel: Recent Labs    12/19/23 0425  CHOL 94  HDL 31*  LDLCALC 34  TRIG 852  CHOLHDL 3.0   Thyroid  Function Tests: No results for input(s): TSH, T4TOTAL, T3FREE, THYROIDAB in the last 72 hours.  Invalid input(s): FREET3 Anemia Panel: No results for input(s): VITAMINB12, FOLATE, FERRITIN, TIBC, IRON, RETICCTPCT in the last 72 hours.  No results found.   Echo pending can be done as outpatient  TELEMETRY: Normal sinus rhythm rate of 76 right bundle branch block:  ASSESSMENT AND PLAN:  Principal Problem:   Chest pain Multivessel coronary disease Recent coronary bypass surgery Hyperlipidemia Hypertension GERD  Plan Patient appears stable for discharge for chest pain appeared to be noncardiac Continue lipid management with Lipitor  follow-up lipid and liver studies Continue Imdur  for angina coronary disease Agree with metoprolol  amlodipine  for hypertension Recommend cardiac rehab post coronary bypass surgery Have patient follow-up with cardiology 1 to 2 weeks   On last hospitalization in April patient was labeled on discharge is a non-STEMI but he was not there was demand ischemia troponins were only about 30     Cara JONETTA Lovelace, MD 12/19/2023 10:30 AM

## 2023-12-19 NOTE — Discharge Summary (Signed)
 Physician Discharge Summary   Patient: Chris Shelton MRN: [id number] DOB: [birth year]  Admit date:     12/18/2023  Discharge date: 12/19/23  Discharge Physician: Amaryllis Dare   PCP: Rudolpho Norleen BIRCH, MD   Recommendations at discharge:  Please obtain CBC and BMP and follow-up Echocardiogram need to be done by cardiology as outpatient Follow-up with cardiology Follow-up with primary care provider  Discharge Diagnoses: Principal Problem:   Chest pain   Hospital Course: Taken from prior notes.   [name] is a 63 y.o. male with medical history significant of  Hypertension, HLD , CAD s/p CABG  5/30,CVA ( 4/25), migraine who presents to ED with complaint of chest pain that woke him up from sleep with severe right sided sharp stabbing chest pain. He states this pain is similar to the pain he felt when he had his heart attack.  Pain relieved with nitroglycerin  and morphine .  Also received a GI cocktail in the ED.  On presentation mildly elevated blood pressure at 169/85, otherwise stable vital and labs.  Mildly elevated troponin with a very slow increase. EKG with NSR, RBBB  Cardiology was consulted.  Echocardiogram ordered  6/28: Hemodynamically stable.  Troponin seems like peaked at 118 and started trending down. Cardiology was suggesting to add Imdur  but patient does not want to take that medicine. Baby aspirin  was also added as he was not on any antiplatelet at home.  Unable to do echocardiogram due to staffing issues over the weekend.  Chest pain has completely resolved and patient wants to go home.  Case was discussed with cardiology and they will arrange echocardiogram as outpatient.  Patient will continue the rest of his home medications and follow-up with his providers closely for further management.  Consultants: Cardiology Procedures performed: None Disposition: Home Diet recommendation:  Discharge Diet Orders (From admission, onward)     Start     Ordered   12/19/23  0000  Diet - low sodium heart healthy        12/19/23 1331           Cardiac diet DISCHARGE MEDICATION: Allergies as of 12/19/2023       Reactions   Penicillins Anaphylaxis   Pt states that I almost died; received it as an infant        Medication List     STOP taking these medications    amLODipine  5 MG tablet Commonly known as: NORVASC    ibuprofen 200 MG tablet Commonly known as: ADVIL   omeprazole  20 MG tablet Commonly known as: PriLOSEC  OTC       TAKE these medications    aspirin  EC 81 MG tablet Take 1 tablet (81 mg total) by mouth daily. Swallow whole. Start taking on: December 20, 2023   atorvastatin  80 MG tablet Commonly known as: LIPITOR  Take 80 mg by mouth at bedtime.   brimonidine  0.2 % ophthalmic solution Commonly known as: ALPHAGAN  Place 1 drop into both eyes 2 (two) times daily.   cholecalciferol 25 MCG (1000 UNIT) tablet Commonly known as: VITAMIN D3 Take 1,000 Units by mouth daily as needed.   CO Q 10 PO Take 1 capsule by mouth daily as needed.   FISH OIL PO Take 1 capsule by mouth daily as needed.   furosemide 40 MG tablet Commonly known as: LASIX Take 40 mg by mouth daily.   isosorbide  mononitrate 30 MG 24 hr tablet Commonly known as: IMDUR  Take 1 tablet (30 mg total) by mouth daily.  metoprolol  tartrate 25 MG tablet Commonly known as: LOPRESSOR  Take 12.5 mg by mouth daily.   Multivitamin Adult Chew Chew 1 tablet by mouth daily as needed.   nitroGLYCERIN  0.4 MG SL tablet Commonly known as: NITROSTAT  Place 1 tablet (0.4 mg total) under the tongue every 5 (five) minutes as needed for chest pain.   omeprazole  20 MG capsule Commonly known as: PRILOSEC  Take 20 mg by mouth daily.   potassium chloride SA 20 MEQ tablet Commonly known as: KLOR-CON M Take 20 mEq by mouth daily.        Follow-up Information     Douglass Maxwell, PA-C. Go in 1 week(s).   Specialty: Physician Assistant Contact information: [facility] [address] [place] [address] [phone]         Rudolpho Norleen BIRCH, MD. Schedule an appointment as soon as possible for a visit in 1 week(s).   Specialty: Internal Medicine Contact information: [address] [facility] [place] [address] [phone]                Discharge Exam: Filed Weights   12/18/23 0420 12/18/23 1818  Weight: 83 kg 83.4 kg   General.  Well-developed gentleman, in no acute distress. Pulmonary.  Lungs clear bilaterally, normal respiratory effort. CV.  Regular rate and rhythm, no JVD, rub or murmur. Abdomen.  Soft, nontender, nondistended, BS positive. CNS.  Alert and oriented .  No focal neurologic deficit. Extremities.  No edema, no cyanosis, pulses intact and symmetrical. Psychiatry.  Judgment and insight appears normal.   Condition at discharge: stable  The results of significant diagnostics from this hospitalization (including imaging, microbiology, ancillary and laboratory) are listed below for reference.   Imaging Studies: No results found.  Microbiology: Results for orders placed or performed during the hospital encounter of 07/09/21  Resp Panel by RT-PCR (Flu A&B, Covid) Nasopharyngeal Swab     Status: None   Collection Time: 07/10/21 12:57 AM   Specimen: Nasopharyngeal Swab; Nasopharyngeal(NP) swabs in vial transport medium  Result Value Ref Range Status   SARS Coronavirus 2 by RT PCR NEGATIVE NEGATIVE Final    Comment: (NOTE) SARS-CoV-2 target nucleic acids are NOT DETECTED.  The SARS-CoV-2 RNA is generally detectable in upper respiratory specimens during the acute phase of infection. The lowest concentration of SARS-CoV-2 viral copies this assay can detect is 138 copies/mL. A negative result does not preclude SARS-Cov-2 infection and should not be used as the sole basis for treatment or other patient management decisions. A negative result may occur with  improper specimen  collection/handling, submission of specimen other than nasopharyngeal swab, presence of viral mutation(s) within the areas targeted by this assay, and inadequate number of viral copies(<138 copies/mL). A negative result must be combined with clinical observations, patient history, and epidemiological information. The expected result is Negative.  Fact Sheet for Patients:  BloggerCourse.com  Fact Sheet for Healthcare Providers:  SeriousBroker.it  This test is no t yet approved or cleared by the [place]  FDA and  has been authorized for detection and/or diagnosis of SARS-CoV-2 by FDA under an Emergency Use Authorization (EUA). This EUA will remain  in effect (meaning this test can be used) for the duration of the COVID-19 declaration under Section 564(b)(1) of the Act, 21 U.S.C.section 360bbb-3(b)(1), unless the authorization is terminated  or revoked sooner.       Influenza A by PCR NEGATIVE NEGATIVE Final   Influenza B by PCR NEGATIVE NEGATIVE Final  Comment: (NOTE) The Xpert Xpress SARS-CoV-2/FLU/RSV plus assay is intended as an aid in the diagnosis of influenza from Nasopharyngeal swab specimens and should not be used as a sole basis for treatment. Nasal washings and aspirates are unacceptable for Xpert Xpress SARS-CoV-2/FLU/RSV testing.  Fact Sheet for Patients: BloggerCourse.com  Fact Sheet for Healthcare Providers: SeriousBroker.it  This test is not yet approved or cleared by the [place]  FDA and has been authorized for detection and/or diagnosis of SARS-CoV-2 by FDA under an Emergency Use Authorization (EUA). This EUA will remain in effect (meaning this test can be used) for the duration of the COVID-19 declaration under Section 564(b)(1) of the Act, 21 U.S.C. section 360bbb-3(b)(1), unless the authorization is terminated or revoked.  Performed at [facility], [address] [place], [place] [address]     Labs: CBC: Recent Labs  Lab 12/18/23 0424 12/18/23 1053  WBC 7.6 7.0  NEUTROABS 4.9  --   HGB 12.1* 12.8*  HCT 36.8* 39.8  MCV 86.2 88.2  PLT 238 212   Basic Metabolic Panel: Recent Labs  Lab 12/18/23 0424 12/18/23 1053  NA 140  --   K 3.9  --   CL 105  --   CO2 24  --   GLUCOSE 120*  --   BUN 18  --   CREATININE 0.76 0.57*  CALCIUM  9.3  --    Liver Function Tests: Recent Labs  Lab 12/18/23 0424  AST 21  ALT 23  ALKPHOS 78  BILITOT 1.2  PROT 7.0  ALBUMIN 3.9   CBG: No results for input(s): GLUCAP in the last 168 hours.  Discharge time spent: greater than 30 minutes.  This record has been created using Conservation officer, historic buildings. Errors have been sought and corrected,but may not always be located. Such creation errors do not reflect on the standard of care.   Signed: Amaryllis Dare, MD [facility] 12/19/2023

## 2023-12-19 NOTE — Hospital Course (Addendum)
 Taken from prior notes.   Chris Shelton is a 63 y.o. male with medical history significant of  Hypertension, HLD , CAD s/p CABG  5/30,CVA ( 4/25), migraine who presents to ED with complaint of chest pain that woke him up from sleep with severe right sided sharp stabbing chest pain. He states this pain is similar to the pain he felt when he had his heart attack.  Pain relieved with nitroglycerin  and morphine .  Also received a GI cocktail in the ED.  On presentation mildly elevated blood pressure at 169/85, otherwise stable vital and labs.  Mildly elevated troponin with a very slow increase. EKG with NSR, RBBB  Cardiology was consulted.  Echocardiogram ordered  6/28: Hemodynamically stable.  Troponin seems like peaked at 118 and started trending down. Cardiology was suggesting to add Imdur  but patient does not want to take that medicine. Baby aspirin  was also added as he was not on any antiplatelet at home.  Unable to do echocardiogram due to staffing issues over the weekend.  Chest pain has completely resolved and patient wants to go home.  Case was discussed with cardiology and they will arrange echocardiogram as outpatient.  Patient will continue the rest of his home medications and follow-up with his providers closely for further management.

## 2023-12-20 LAB — TROPONIN T: Troponin T (Highly Sensitive): 41 ng/L — ABNORMAL HIGH (ref 0–22)

## 2023-12-21 ENCOUNTER — Encounter

## 2023-12-21 VITALS — Ht 69.5 in | Wt 182.3 lb

## 2023-12-21 DIAGNOSIS — Z951 Presence of aortocoronary bypass graft: Secondary | ICD-10-CM | POA: Insufficient documentation

## 2023-12-21 DIAGNOSIS — Z48812 Encounter for surgical aftercare following surgery on the circulatory system: Secondary | ICD-10-CM | POA: Insufficient documentation

## 2023-12-21 DIAGNOSIS — I214 Non-ST elevation (NSTEMI) myocardial infarction: Secondary | ICD-10-CM

## 2023-12-21 DIAGNOSIS — I252 Old myocardial infarction: Secondary | ICD-10-CM | POA: Insufficient documentation

## 2023-12-21 NOTE — Progress Notes (Signed)
 Cardiac Individual Treatment Plan  Patient Details  Name: Chris Shelton MRN: [id number] Date of Birth: Sep 22, 1960 Referring Provider:   Flowsheet Row Cardiac Rehab from 12/21/2023 in [facility] Cardiac and Pulmonary Rehab  Referring Provider Florencio Kava, MD    Initial Encounter Date:  Flowsheet Row Cardiac Rehab from 12/21/2023 in [facility] Cardiac and Pulmonary Rehab  Date 12/21/23    Visit Diagnosis: NSTEMI (non-ST elevation myocardial infarction) (HCC)  S/P CABG x 3  Patient's Home Medications on Admission:  Current Outpatient Medications:    aspirin  EC 81 MG tablet, Take 1 tablet (81 mg total) by mouth daily. Swallow whole., Disp: 30 tablet, Rfl: 12   atorvastatin  (LIPITOR ) 80 MG tablet, Take 80 mg by mouth at bedtime., Disp: , Rfl:    brimonidine  (ALPHAGAN ) 0.2 % ophthalmic solution, Place 1 drop into both eyes 2 (two) times daily., Disp: , Rfl:    cholecalciferol (VITAMIN D3) 25 MCG (1000 UNIT) tablet, Take 1,000 Units by mouth daily as needed., Disp: , Rfl:    Coenzyme Q10 (CO Q 10 PO), Take 1 capsule by mouth daily as needed., Disp: , Rfl:    furosemide (LASIX) 40 MG tablet, Take 40 mg by mouth daily., Disp: , Rfl:    isosorbide  mononitrate (IMDUR ) 30 MG 24 hr tablet, Take 1 tablet (30 mg total) by mouth daily. (Patient not taking: Reported on 12/17/2023), Disp: 30 tablet, Rfl: 1   metoprolol  tartrate (LOPRESSOR ) 25 MG tablet, Take 12.5 mg by mouth daily., Disp: , Rfl:    Multiple Vitamins-Minerals (MULTIVITAMIN ADULT) CHEW, Chew 1 tablet by mouth daily as needed., Disp: , Rfl:    nitroGLYCERIN  (NITROSTAT ) 0.4 MG SL tablet, Place 1 tablet (0.4 mg total) under the tongue every 5 (five) minutes as needed for chest pain., Disp: 30 tablet, Rfl: 1   Omega-3 Fatty Acids (FISH OIL PO), Take 1 capsule by mouth daily as needed., Disp: , Rfl:    omeprazole  (PRILOSEC ) 20 MG capsule, Take 20 mg by mouth daily., Disp: , Rfl:    potassium chloride SA (KLOR-CON M) 20 MEQ tablet, Take 20 mEq by mouth  daily., Disp: , Rfl:   Past Medical History: Past Medical History:  Diagnosis Date   Cataracts, bilateral 2019   Detached retina    Palpitations    Sinus trouble 2019   Stroke [facility])    TIA (transient ischemic attack)     Tobacco Use: Social History   Tobacco Use  Smoking Status Never  Smokeless Tobacco Never    Labs: Review Flowsheet       Latest Ref Rng & Units 07/10/2021 11/04/2021 12/19/2023  Labs for ITP Cardiac and Pulmonary Rehab  Cholestrol 0 - 200 mg/dL 778  784  94   LDL (calc) 0 - 99 mg/dL 849  856  34   HDL-C >59 mg/dL 39  43  31   Trlycerides <150 mg/dL 840  853  852   Hemoglobin A1c 4.8 - 5.6 % 5.6  - -     Exercise Target Goals: Exercise Program Goal: Individual exercise prescription set using results from initial 6 min walk test and THRR while considering  patient's activity barriers and safety.   Exercise Prescription Goal: Initial exercise prescription builds to 30-45 minutes a day of aerobic activity, 2-3 days per week.  Home exercise guidelines will be given to patient during program as part of exercise prescription that the participant will acknowledge.   Education: Aerobic Exercise: - Group verbal and visual presentation on the components of  exercise prescription. Introduces F.I.T.T principle from ACSM for exercise prescriptions.  Reviews F.I.T.T. principles of aerobic exercise including progression. Written material given at graduation.   Education: Resistance Exercise: - Group verbal and visual presentation on the components of exercise prescription. Introduces F.I.T.T principle from ACSM for exercise prescriptions  Reviews F.I.T.T. principles of resistance exercise including progression. Written material given at graduation.    Education: Exercise & Equipment Safety: - Individual verbal instruction and demonstration of equipment use and safety with use of the equipment. Flowsheet Row Cardiac Rehab from 12/21/2023 in [facility] Cardiac and Pulmonary  Rehab  Date 12/21/23  Educator MB  Instruction Review Code 1- Verbalizes Understanding    Education: Exercise Physiology & General Exercise Guidelines: - Group verbal and written instruction with models to review the exercise physiology of the cardiovascular system and associated critical values. Provides general exercise guidelines with specific guidelines to those with heart or lung disease.    Education: Flexibility, Balance, Mind/Body Relaxation: - Group verbal and visual presentation with interactive activity on the components of exercise prescription. Introduces F.I.T.T principle from ACSM for exercise prescriptions. Reviews F.I.T.T. principles of flexibility and balance exercise training including progression. Also discusses the mind body connection.  Reviews various relaxation techniques to help reduce and manage stress (i.e. Deep breathing, progressive muscle relaxation, and visualization). Balance handout provided to take home. Written material given at graduation.   Activity Barriers & Risk Stratification:  Activity Barriers & Cardiac Risk Stratification - 12/21/23 1153       Activity Barriers & Cardiac Risk Stratification   Activity Barriers None    Cardiac Risk Stratification High          6 Minute Walk:  6 Minute Walk     Row Name 12/21/23 1152         6 Minute Walk   Phase Initial     Distance 1490 feet     Walk Time 6 minutes     # of Rest Breaks 0     MPH 2.82     METS 3.83     RPE 11     Perceived Dyspnea  0     VO2 Peak 13.4     Symptoms No     Resting HR 54 bpm     Resting BP 126/70     Resting Oxygen Saturation  99 %     Exercise Oxygen Saturation  during 6 min walk 99 %     Max Ex. HR 94 bpm     Max Ex. BP 156/82     2 Minute Post BP 142/80        Oxygen Initial Assessment:   Oxygen Re-Evaluation:   Oxygen Discharge (Final Oxygen Re-Evaluation):   Initial Exercise Prescription:  Initial Exercise Prescription - 12/21/23 1100        Date of Initial Exercise RX and Referring Provider   Date 12/21/23    Referring Provider Florencio Kava, MD      Oxygen   Maintain Oxygen Saturation 88% or higher      Treadmill   MPH 2.8    Grade 0    Minutes 15    METs 3.14      NuStep   Level 3   T6   SPM 80    Minutes 15    METs 3.83      Elliptical   Level 1    Speed 3    Minutes 15    METs 3.83  REL-XR   Level 3    Speed 50    Minutes 15    METs 3.83      Rower   Level 3    Watts 25    Minutes 15    METs 3.83      Prescription Details   Frequency (times per week) 3    Duration Progress to 30 minutes of continuous aerobic without signs/symptoms of physical distress      Intensity   THRR 40-80% of Max Heartrate 95-136    Ratings of Perceived Exertion 11-13    Perceived Dyspnea 0-4      Progression   Progression Continue to progress workloads to maintain intensity without signs/symptoms of physical distress.      Resistance Training   Training Prescription Yes    Weight 7 lb    Reps 10-15          Perform Capillary Blood Glucose checks as needed.  Exercise Prescription Changes:   Exercise Prescription Changes     Row Name 12/21/23 1100             Response to Exercise   Blood Pressure (Admit) 126/70       Blood Pressure (Exercise) 156/82       Blood Pressure (Exit) 142/80       Heart Rate (Admit) 54 bpm       Heart Rate (Exercise) 94 bpm       Heart Rate (Exit) 54 bpm       Oxygen Saturation (Admit) 99 %       Oxygen Saturation (Exercise) 99 %       Oxygen Saturation (Exit) 97 %       Rating of Perceived Exertion (Exercise) 11       Perceived Dyspnea (Exercise) 0       Symptoms none       Comments results       Intensity THRR New         Progression   Average METs 3.83          Exercise Comments:   Exercise Goals and Review:   Exercise Goals     Row Name 12/21/23 1158             Exercise Goals   Increase Physical Activity Yes       Intervention  Provide advice, education, support and counseling about physical activity/exercise needs.;Develop an individualized exercise prescription for aerobic and resistive training based on initial evaluation findings, risk stratification, comorbidities and participant's personal goals.       Expected Outcomes Short Term: Attend rehab on a regular basis to increase amount of physical activity.;Long Term: Add in home exercise to make exercise part of routine and to increase amount of physical activity.;Long Term: Exercising regularly at least 3-5 days a week.       Increase Strength and Stamina Yes       Intervention Provide advice, education, support and counseling about physical activity/exercise needs.;Develop an individualized exercise prescription for aerobic and resistive training based on initial evaluation findings, risk stratification, comorbidities and participant's personal goals.       Expected Outcomes Short Term: Increase workloads from initial exercise prescription for resistance, speed, and METs.;Short Term: Perform resistance training exercises routinely during rehab and add in resistance training at home;Long Term: Improve cardiorespiratory fitness, muscular endurance and strength as measured by increased METs and functional capacity ( )       Able to understand and use rate  of perceived exertion (RPE) scale Yes       Intervention Provide education and explanation on how to use RPE scale       Expected Outcomes Short Term: Able to use RPE daily in rehab to express subjective intensity level;Long Term:  Able to use RPE to guide intensity level when exercising independently       Able to understand and use Dyspnea scale Yes       Intervention Provide education and explanation on how to use Dyspnea scale       Expected Outcomes Short Term: Able to use Dyspnea scale daily in rehab to express subjective sense of shortness of breath during exertion;Long Term: Able to use Dyspnea scale to guide  intensity level when exercising independently       Knowledge and understanding of Target Heart Rate Range (THRR) Yes       Intervention Provide education and explanation of THRR including how the numbers were predicted and where they are located for reference       Expected Outcomes Short Term: Able to state/look up THRR;Short Term: Able to use daily as guideline for intensity in rehab;Long Term: Able to use THRR to govern intensity when exercising independently       Able to check pulse independently Yes       Intervention Provide education and demonstration on how to check pulse in carotid and radial arteries.;Review the importance of being able to check your own pulse for safety during independent exercise       Expected Outcomes Short Term: Able to explain why pulse checking is important during independent exercise;Long Term: Able to check pulse independently and accurately       Understanding of Exercise Prescription Yes       Intervention Provide education, explanation, and written materials on patient's individual exercise prescription       Expected Outcomes Short Term: Able to explain program exercise prescription;Long Term: Able to explain home exercise prescription to exercise independently          Exercise Goals Re-Evaluation :   Discharge Exercise Prescription (Final Exercise Prescription Changes):  Exercise Prescription Changes - 12/21/23 1100       Response to Exercise   Blood Pressure (Admit) 126/70    Blood Pressure (Exercise) 156/82    Blood Pressure (Exit) 142/80    Heart Rate (Admit) 54 bpm    Heart Rate (Exercise) 94 bpm    Heart Rate (Exit) 54 bpm    Oxygen Saturation (Admit) 99 %    Oxygen Saturation (Exercise) 99 %    Oxygen Saturation (Exit) 97 %    Rating of Perceived Exertion (Exercise) 11    Perceived Dyspnea (Exercise) 0    Symptoms none    Comments results    Intensity THRR New      Progression   Average METs 3.83          Nutrition:   Target Goals: Understanding of nutrition guidelines, daily intake of sodium 1500mg , cholesterol 200mg , calories 30% from fat and 7% or less from saturated fats, daily to have 5 or more servings of fruits and vegetables.  Education: All About Nutrition: -Group instruction provided by verbal, written material, interactive activities, discussions, models, and posters to present general guidelines for heart healthy nutrition including fat, fiber, MyPlate, the role of sodium in heart healthy nutrition, utilization of the nutrition label, and utilization of this knowledge for meal planning. Follow up email sent as well. Written material given at  graduation.   Biometrics:  Pre Biometrics - 12/21/23 1158       Pre Biometrics   Height 5' 9.5 (1.765 m)    Weight 182 lb 4.8 oz (82.7 kg)    Waist Circumference 37.5 inches    Hip Circumference 41 inches    Waist to Hip Ratio 0.91 %    BMI (Calculated) 26.54    Single Leg Stand 30 seconds           Nutrition Therapy Plan and Nutrition Goals:  Nutrition Therapy & Goals - 12/21/23 1159       Nutrition Therapy   RD appointment deferred Yes      Personal Nutrition Goals   Nutrition Goal RD appointment deferred at this time, might schedule in future      Intervention Plan   Intervention Prescribe, educate and counsel regarding individualized specific dietary modifications aiming towards targeted core components such as weight, hypertension, lipid management, diabetes, heart failure and other comorbidities.    Expected Outcomes Short Term Goal: Understand basic principles of dietary content, such as calories, fat, sodium, cholesterol and nutrients.          Nutrition Assessments:  MEDIFICTS Score Key: >=70 Need to make dietary changes  40-70 Heart Healthy Diet <= 40 Therapeutic Level Cholesterol Diet  Flowsheet Row Cardiac Rehab from 12/21/2023 in [facility] Cardiac and Pulmonary Rehab  Picture Your Plate Total Score on Admission 83    Picture Your Plate Scores: <59 Unhealthy dietary pattern with much room for improvement. 41-50 Dietary pattern unlikely to meet recommendations for good health and room for improvement. 51-60 More healthful dietary pattern, with some room for improvement.  >60 Healthy dietary pattern, although there may be some specific behaviors that could be improved.    Nutrition Goals Re-Evaluation:   Nutrition Goals Discharge (Final Nutrition Goals Re-Evaluation):   Psychosocial: Target Goals: Acknowledge presence or absence of significant depression and/or stress, maximize coping skills, provide positive support system. Participant is able to verbalize types and ability to use techniques and skills needed for reducing stress and depression.   Education: Stress, Anxiety, and Depression - Group verbal and visual presentation to define topics covered.  Reviews how body is impacted by stress, anxiety, and depression.  Also discusses healthy ways to reduce stress and to treat/manage anxiety and depression.  Written material given at graduation.   Education: Sleep Hygiene -Provides group verbal and written instruction about how sleep can affect your health.  Define sleep hygiene, discuss sleep cycles and impact of sleep habits. Review good sleep hygiene tips.    Initial Review & Psychosocial Screening:  Initial Psych Review & Screening - 12/17/23 1440       Initial Review   Current issues with None Identified      Family Dynamics   Good Support System? Yes   wife, family     Barriers   Psychosocial barriers to participate in program There are no identifiable barriers or psychosocial needs.      Screening Interventions   Interventions To provide support and resources with identified psychosocial needs;Provide feedback about the scores to participant;Encouraged to exercise    Expected Outcomes Short Term goal: Utilizing psychosocial counselor, staff and physician to assist with identification  of specific Stressors or current issues interfering with healing process. Setting desired goal for each stressor or current issue identified.;Long Term Goal: Stressors or current issues are controlled or eliminated.;Short Term goal: Identification and review with participant of any Quality of Life or Depression concerns found  by scoring the questionnaire.;Long Term goal: The participant improves quality of Life and PHQ9 Scores as seen by post scores and/or verbalization of changes          Quality of Life Scores:   Quality of Life - 12/21/23 1159       Quality of Life   Select Quality of Life      Quality of Life Scores   Health/Function Pre 24.4 %    Socioeconomic Pre 25.36 %    Psych/Spiritual Pre 24.93 %    Family Pre 28.8 %    GLOBAL Pre 25.35 %         Scores of 19 and below usually indicate a poorer quality of life in these areas.  A difference of  2-3 points is a clinically meaningful difference.  A difference of 2-3 points in the total score of the Quality of Life Index has been associated with significant improvement in overall quality of life, self-image, physical symptoms, and general health in studies assessing change in quality of life.  PHQ-9: Review Flowsheet       12/21/2023  Depression screen PHQ 2/9  Decreased Interest 0  Down, Depressed, Hopeless 0  PHQ - 2 Score 0  Altered sleeping 0  Tired, decreased energy 0  Change in appetite 0  Feeling bad or failure about yourself  0  Trouble concentrating 0  Moving slowly or fidgety/restless 0  Suicidal thoughts 0  PHQ-9 Score 0   Interpretation of Total Score  Total Score Depression Severity:  1-4 = Minimal depression, 5-9 = Mild depression, 10-14 = Moderate depression, 15-19 = Moderately severe depression, 20-27 = Severe depression   Psychosocial Evaluation and Intervention:  Psychosocial Evaluation - 12/17/23 1521       Psychosocial Evaluation & Interventions   Interventions Encouraged to exercise with  the program and follow exercise prescription    Comments There are no barriers to attending the program.  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 He has support from his wife.  He is ready to start the program and complete it with an improved fitness level.    Expected Outcomes STG attend all scheduled sessions, work with EP on exercise progression working toward improved fit level/ LTG  improves fitness level and continued exercise progression    Continue Psychosocial Services  Follow up required by staff          Psychosocial Re-Evaluation:   Psychosocial Discharge (Final Psychosocial Re-Evaluation):   Vocational Rehabilitation: Provide vocational rehab assistance to qualifying candidates.   Vocational Rehab Evaluation & Intervention:  Vocational Rehab - 12/17/23 1441       Initial Vocational Rehab Evaluation & Intervention   Assessment shows need for Vocational Rehabilitation No      Vocational Rehab Re-Evaulation   Comments no request for VR at this time          Education: Education Goals: Education classes will be provided on a variety of topics geared toward better understanding of heart health and risk factor modification. Participant will state understanding/return demonstration of topics presented as noted by education test scores.  Learning Barriers/Preferences:  Learning Barriers/Preferences - 12/17/23 1441       Learning Barriers/Preferences   Learning Barriers None    Learning Preferences None          General Cardiac Education Topics:  AED/CPR: - Group verbal and written instruction with the use of models to demonstrate the basic use of  the AED with the basic ABC's of resuscitation.   Anatomy and Cardiac Procedures: - Group verbal and visual presentation and models provide information about basic cardiac anatomy and function. Reviews the testing methods done to diagnose heart disease and the outcomes of the test results.  Describes the treatment choices: Medical Management, Angioplasty, or Coronary Bypass Surgery for treating various heart conditions including Myocardial Infarction, Angina, Valve Disease, and Cardiac Arrhythmias.  Written material given at graduation. Flowsheet Row Cardiac Rehab from 12/21/2023 in [facility] Cardiac and Pulmonary Rehab  Education need identified 12/21/23    Medication Safety: - Group verbal and visual instruction to review commonly prescribed medications for heart and lung disease. Reviews the medication, class of the drug, and side effects. Includes the steps to properly store meds and maintain the prescription regimen.  Written material given at graduation.   Intimacy: - Group verbal instruction through game format to discuss how heart and lung disease can affect sexual intimacy. Written material given at graduation..   Know Your Numbers and Heart Failure: - Group verbal and visual instruction to discuss disease risk factors for cardiac and pulmonary disease and treatment options.  Reviews associated critical values for Overweight/Obesity, Hypertension, Cholesterol, and Diabetes.  Discusses basics of heart failure: signs/symptoms and treatments.  Introduces Heart Failure Zone chart for action plan for heart failure.  Written material given at graduation.   Infection Prevention: - Provides verbal and written material to individual with discussion of infection control including proper hand washing and proper equipment cleaning during exercise session. Flowsheet Row Cardiac Rehab from 12/21/2023 in [facility] Cardiac and Pulmonary Rehab  Date 12/21/23  Educator MB  Instruction Review Code 1- Verbalizes Understanding    Falls Prevention: - Provides verbal and written material to individual with discussion of falls prevention and safety. Flowsheet Row Cardiac Rehab from 12/21/2023 in [facility] Cardiac and Pulmonary Rehab  Date 12/21/23  Educator MB  Instruction Review Code 1- Verbalizes  Understanding    Other: -Provides group and verbal instruction on various topics (see comments)   Knowledge Questionnaire Score:  Knowledge Questionnaire Score - 12/21/23 1201       Knowledge Questionnaire Score   Pre Score 25/26          Core Components/Risk Factors/Patient Goals at Admission:  Personal Goals and Risk Factors at Admission - 12/21/23 1201       Core Components/Risk Factors/Patient Goals on Admission    Weight Management Yes;Weight Maintenance    Intervention Weight Management: Develop a combined nutrition and exercise program designed to reach desired caloric intake, while maintaining appropriate intake of nutrient and fiber, sodium and fats, and appropriate energy expenditure required for the weight goal.;Weight Management: Provide education and appropriate resources to help participant work on and attain dietary goals.;Weight Management/Obesity: Establish reasonable short term and long term weight goals.    Admit Weight 182 lb 4.8 oz (82.7 kg)    Goal Weight: Short Term 182 lb 4.8 oz (82.7 kg)    Goal Weight: Long Term 182 lb 4.8 oz (82.7 kg)    Expected Outcomes Short Term: Continue to assess and modify interventions until short term weight is achieved;Long Term: Adherence to nutrition and physical activity/exercise program aimed toward attainment of established weight goal;Weight Maintenance: Understanding of the daily nutrition guidelines, which includes 25-35% calories from fat, 7% or less cal from saturated fats, less than 200mg  cholesterol, less than 1.5gm of sodium, & 5 or more servings of fruits and vegetables daily;Understanding recommendations for meals to include 15-35% energy  as protein, 25-35% energy from fat, 35-60% energy from carbohydrates, less than 200mg  of dietary cholesterol, 20-35 gm of total fiber daily;Understanding of distribution of calorie intake throughout the day with the consumption of 4-5 meals/snacks    Hypertension Yes    Intervention  Provide education on lifestyle modifcations including regular physical activity/exercise, weight management, moderate sodium restriction and increased consumption of fresh fruit, vegetables, and low fat dairy, alcohol moderation, and smoking cessation.;Monitor prescription use compliance.    Expected Outcomes Short Term: Continued assessment and intervention until BP is < 140/21mm HG in hypertensive participants. < 130/72mm HG in hypertensive participants with diabetes, heart failure or chronic kidney disease.;Long Term: Maintenance of blood pressure at goal levels.    Lipids Yes    Intervention Provide education and support for participant on nutrition & aerobic/resistive exercise along with prescribed medications to achieve LDL 70mg , HDL >40mg .    Expected Outcomes Short Term: Participant states understanding of desired cholesterol values and is compliant with medications prescribed. Participant is following exercise prescription and nutrition guidelines.;Long Term: Cholesterol controlled with medications as prescribed, with individualized exercise RX and with personalized nutrition plan. Value goals: LDL < 70mg , HDL > 40 mg.          Education:Diabetes - Individual verbal and written instruction to review signs/symptoms of diabetes, desired ranges of glucose level fasting, after meals and with exercise. Acknowledge that pre and post exercise glucose checks will be done for 3 sessions at entry of program.   Core Components/Risk Factors/Patient Goals Review:    Core Components/Risk Factors/Patient Goals at Discharge (Final Review):    ITP Comments:  ITP Comments     Row Name 12/17/23 1520 12/21/23 1152         ITP Comments Virtual orientation call completed today. he has an appointment on  0 12/21/2023 for EP eval and gym Orientation.  Documentation of diagnosis can be found in [facility] 11/20/2023 . Completed and gym orientation for cardiac rehab. Initial ITP created and sent for review to  Dr. Oneil Pinal, Medical Director.         Comments: Initial ITP

## 2023-12-21 NOTE — Patient Instructions (Signed)
 Patient Instructions  Patient Details  Name: Chris Shelton MRN: [id number] Date of Birth: 04/09/1961 Referring Provider:  Florencio Cara BIRCH, MD  Below are your personal goals for exercise, nutrition, and risk factors. Our goal is to help you stay on track towards obtaining and maintaining these goals. We will be discussing your progress on these goals with you throughout the program.  Initial Exercise Prescription:  Initial Exercise Prescription - 12/21/23 1100       Date of Initial Exercise RX and Referring Provider   Date 12/21/23    Referring Provider Florencio Cara, MD      Oxygen   Maintain Oxygen Saturation 88% or higher      Treadmill   MPH 2.8    Grade 0    Minutes 15    METs 3.14      NuStep   Level 3   T6   SPM 80    Minutes 15    METs 3.83      Elliptical   Level 1    Speed 3    Minutes 15    METs 3.83      REL-XR   Level 3    Speed 50    Minutes 15    METs 3.83      Rower   Level 3    Watts 25    Minutes 15    METs 3.83      Prescription Details   Frequency (times per week) 3    Duration Progress to 30 minutes of continuous aerobic without signs/symptoms of physical distress      Intensity   THRR 40-80% of Max Heartrate 95-136    Ratings of Perceived Exertion 11-13    Perceived Dyspnea 0-4      Progression   Progression Continue to progress workloads to maintain intensity without signs/symptoms of physical distress.      Resistance Training   Training Prescription Yes    Weight 7 lb    Reps 10-15          Exercise Goals: Frequency: Be able to perform aerobic exercise two to three times per week in program working toward 2-5 days per week of home exercise.  Intensity: Work with a perceived exertion of 11 (fairly light) - 15 (hard) while following your exercise prescription.  We will make changes to your prescription with you as you progress through the program.   Duration: Be able to do 30 to 45 minutes of continuous aerobic  exercise in addition to a 5 minute warm-up and a 5 minute cool-down routine.   Nutrition Goals: Your personal nutrition goals will be established when you do your nutrition analysis with the dietician.  The following are general nutrition guidelines to follow: Cholesterol < 200mg /day Sodium < 1500mg /day Fiber: Men over 50 yrs - 30 grams per day  Personal Goals:  Personal Goals and Risk Factors at Admission - 12/21/23 1201       Core Components/Risk Factors/Patient Goals on Admission    Weight Management Yes;Weight Maintenance    Intervention Weight Management: Develop a combined nutrition and exercise program designed to reach desired caloric intake, while maintaining appropriate intake of nutrient and fiber, sodium and fats, and appropriate energy expenditure required for the weight goal.;Weight Management: Provide education and appropriate resources to help participant work on and attain dietary goals.;Weight Management/Obesity: Establish reasonable short term and long term weight goals.    Admit Weight 182 lb 4.8 oz (82.7 kg)    Goal  Weight: Short Term 182 lb 4.8 oz (82.7 kg)    Goal Weight: Long Term 182 lb 4.8 oz (82.7 kg)    Expected Outcomes Short Term: Continue to assess and modify interventions until short term weight is achieved;Long Term: Adherence to nutrition and physical activity/exercise program aimed toward attainment of established weight goal;Weight Maintenance: Understanding of the daily nutrition guidelines, which includes 25-35% calories from fat, 7% or less cal from saturated fats, less than 200mg  cholesterol, less than 1.5gm of sodium, & 5 or more servings of fruits and vegetables daily;Understanding recommendations for meals to include 15-35% energy as protein, 25-35% energy from fat, 35-60% energy from carbohydrates, less than 200mg  of dietary cholesterol, 20-35 gm of total fiber daily;Understanding of distribution of calorie intake throughout the day with the consumption  of 4-5 meals/snacks    Hypertension Yes    Intervention Provide education on lifestyle modifcations including regular physical activity/exercise, weight management, moderate sodium restriction and increased consumption of fresh fruit, vegetables, and low fat dairy, alcohol moderation, and smoking cessation.;Monitor prescription use compliance.    Expected Outcomes Short Term: Continued assessment and intervention until BP is < 140/21mm HG in hypertensive participants. < 130/68mm HG in hypertensive participants with diabetes, heart failure or chronic kidney disease.;Long Term: Maintenance of blood pressure at goal levels.    Lipids Yes    Intervention Provide education and support for participant on nutrition & aerobic/resistive exercise along with prescribed medications to achieve LDL 70mg , HDL >40mg .    Expected Outcomes Short Term: Participant states understanding of desired cholesterol values and is compliant with medications prescribed. Participant is following exercise prescription and nutrition guidelines.;Long Term: Cholesterol controlled with medications as prescribed, with individualized exercise RX and with personalized nutrition plan. Value goals: LDL < 70mg , HDL > 40 mg.          Tobacco Use Initial Evaluation: Social History   Tobacco Use  Smoking Status Never  Smokeless Tobacco Never    Exercise Goals and Review:  Exercise Goals     Row Name 12/21/23 1158             Exercise Goals   Increase Physical Activity Yes       Intervention Provide advice, education, support and counseling about physical activity/exercise needs.;Develop an individualized exercise prescription for aerobic and resistive training based on initial evaluation findings, risk stratification, comorbidities and participant's personal goals.       Expected Outcomes Short Term: Attend rehab on a regular basis to increase amount of physical activity.;Long Term: Add in home exercise to make exercise part of  routine and to increase amount of physical activity.;Long Term: Exercising regularly at least 3-5 days a week.       Increase Strength and Stamina Yes       Intervention Provide advice, education, support and counseling about physical activity/exercise needs.;Develop an individualized exercise prescription for aerobic and resistive training based on initial evaluation findings, risk stratification, comorbidities and participant's personal goals.       Expected Outcomes Short Term: Increase workloads from initial exercise prescription for resistance, speed, and METs.;Short Term: Perform resistance training exercises routinely during rehab and add in resistance training at home;Long Term: Improve cardiorespiratory fitness, muscular endurance and strength as measured by increased METs and functional capacity ( )       Able to understand and use rate of perceived exertion (RPE) scale Yes       Intervention Provide education and explanation on how to use RPE scale  Expected Outcomes Short Term: Able to use RPE daily in rehab to express subjective intensity level;Long Term:  Able to use RPE to guide intensity level when exercising independently       Able to understand and use Dyspnea scale Yes       Intervention Provide education and explanation on how to use Dyspnea scale       Expected Outcomes Short Term: Able to use Dyspnea scale daily in rehab to express subjective sense of shortness of breath during exertion;Long Term: Able to use Dyspnea scale to guide intensity level when exercising independently       Knowledge and understanding of Target Heart Rate Range (THRR) Yes       Intervention Provide education and explanation of THRR including how the numbers were predicted and where they are located for reference       Expected Outcomes Short Term: Able to state/look up THRR;Short Term: Able to use daily as guideline for intensity in rehab;Long Term: Able to use THRR to govern intensity when  exercising independently       Able to check pulse independently Yes       Intervention Provide education and demonstration on how to check pulse in carotid and radial arteries.;Review the importance of being able to check your own pulse for safety during independent exercise       Expected Outcomes Short Term: Able to explain why pulse checking is important during independent exercise;Long Term: Able to check pulse independently and accurately       Understanding of Exercise Prescription Yes       Intervention Provide education, explanation, and written materials on patient's individual exercise prescription       Expected Outcomes Short Term: Able to explain program exercise prescription;Long Term: Able to explain home exercise prescription to exercise independently

## 2023-12-23 ENCOUNTER — Encounter: Payer: Self-pay | Admitting: *Deleted

## 2023-12-23 ENCOUNTER — Encounter: Attending: Internal Medicine

## 2023-12-23 DIAGNOSIS — I214 Non-ST elevation (NSTEMI) myocardial infarction: Secondary | ICD-10-CM

## 2023-12-23 DIAGNOSIS — I252 Old myocardial infarction: Secondary | ICD-10-CM | POA: Diagnosis not present

## 2023-12-23 DIAGNOSIS — Z48812 Encounter for surgical aftercare following surgery on the circulatory system: Secondary | ICD-10-CM | POA: Insufficient documentation

## 2023-12-23 DIAGNOSIS — Z951 Presence of aortocoronary bypass graft: Secondary | ICD-10-CM

## 2023-12-23 NOTE — Progress Notes (Signed)
 Cardiac Individual Treatment Plan  Patient Details  Name: Chris Shelton MRN: [id number] Date of Birth: [birth year] Referring Provider:   Flowsheet Row Cardiac Rehab from 12/21/2023 in [facility] Cardiac and Pulmonary Rehab  Referring Provider Florencio Kava, MD    Initial Encounter Date:  Flowsheet Row Cardiac Rehab from 12/21/2023 in [facility] Cardiac and Pulmonary Rehab  Date 12/21/23    Visit Diagnosis: NSTEMI (non-ST elevation myocardial infarction) (HCC)  S/P CABG x 3  Patient's Home Medications on Admission:  Current Outpatient Medications:    aspirin  EC 81 MG tablet, Take 1 tablet (81 mg total) by mouth daily. Swallow whole., Disp: 30 tablet, Rfl: 12   atorvastatin  (LIPITOR ) 80 MG tablet, Take 80 mg by mouth at bedtime., Disp: , Rfl:    brimonidine  (ALPHAGAN ) 0.2 % ophthalmic solution, Place 1 drop into both eyes 2 (two) times daily., Disp: , Rfl:    cholecalciferol (VITAMIN D3) 25 MCG (1000 UNIT) tablet, Take 1,000 Units by mouth daily as needed., Disp: , Rfl:    Coenzyme Q10 (CO Q 10 PO), Take 1 capsule by mouth daily as needed., Disp: , Rfl:    furosemide (LASIX) 40 MG tablet, Take 40 mg by mouth daily., Disp: , Rfl:    isosorbide  mononitrate (IMDUR ) 30 MG 24 hr tablet, Take 1 tablet (30 mg total) by mouth daily. (Patient not taking: Reported on 12/17/2023), Disp: 30 tablet, Rfl: 1   metoprolol  tartrate (LOPRESSOR ) 25 MG tablet, Take 12.5 mg by mouth daily., Disp: , Rfl:    Multiple Vitamins-Minerals (MULTIVITAMIN ADULT) CHEW, Chew 1 tablet by mouth daily as needed., Disp: , Rfl:    nitroGLYCERIN  (NITROSTAT ) 0.4 MG SL tablet, Place 1 tablet (0.4 mg total) under the tongue every 5 (five) minutes as needed for chest pain., Disp: 30 tablet, Rfl: 1   Omega-3 Fatty Acids (FISH OIL PO), Take 1 capsule by mouth daily as needed., Disp: , Rfl:    omeprazole  (PRILOSEC ) 20 MG capsule, Take 20 mg by mouth daily., Disp: , Rfl:    potassium chloride SA (KLOR-CON M) 20 MEQ tablet, Take 20 mEq by mouth  daily., Disp: , Rfl:   Past Medical History: Past Medical History:  Diagnosis Date   Cataracts, bilateral 2019   Detached retina    Palpitations    Sinus trouble 2019   Stroke [facility])    TIA (transient ischemic attack)     Tobacco Use: Social History   Tobacco Use  Smoking Status Never  Smokeless Tobacco Never    Labs: Review Flowsheet       Latest Ref Rng & Units 07/10/2021 11/04/2021 12/19/2023  Labs for ITP Cardiac and Pulmonary Rehab  Cholestrol 0 - 200 mg/dL 778  784  94   LDL (calc) 0 - 99 mg/dL 849  856  34   HDL-C >59 mg/dL 39  43  31   Trlycerides <150 mg/dL 840  853  852   Hemoglobin A1c 4.8 - 5.6 % 5.6  - -     Exercise Target Goals: Exercise Program Goal: Individual exercise prescription set using results from initial 6 min walk test and THRR while considering  patient's activity barriers and safety.   Exercise Prescription Goal: Initial exercise prescription builds to 30-45 minutes a day of aerobic activity, 2-3 days per week.  Home exercise guidelines will be given to patient during program as part of exercise prescription that the participant will acknowledge.   Education: Aerobic Exercise: - Group verbal and visual presentation on the components of  exercise prescription. Introduces F.I.T.T principle from ACSM for exercise prescriptions.  Reviews F.I.T.T. principles of aerobic exercise including progression. Written material given at graduation.   Education: Resistance Exercise: - Group verbal and visual presentation on the components of exercise prescription. Introduces F.I.T.T principle from ACSM for exercise prescriptions  Reviews F.I.T.T. principles of resistance exercise including progression. Written material given at graduation.    Education: Exercise & Equipment Safety: - Individual verbal instruction and demonstration of equipment use and safety with use of the equipment. Flowsheet Row Cardiac Rehab from 12/21/2023 in [facility] Cardiac and Pulmonary  Rehab  Date 12/21/23  Educator MB  Instruction Review Code 1- Verbalizes Understanding    Education: Exercise Physiology & General Exercise Guidelines: - Group verbal and written instruction with models to review the exercise physiology of the cardiovascular system and associated critical values. Provides general exercise guidelines with specific guidelines to those with heart or lung disease.    Education: Flexibility, Balance, Mind/Body Relaxation: - Group verbal and visual presentation with interactive activity on the components of exercise prescription. Introduces F.I.T.T principle from ACSM for exercise prescriptions. Reviews F.I.T.T. principles of flexibility and balance exercise training including progression. Also discusses the mind body connection.  Reviews various relaxation techniques to help reduce and manage stress (i.e. Deep breathing, progressive muscle relaxation, and visualization). Balance handout provided to take home. Written material given at graduation.   Activity Barriers & Risk Stratification:  Activity Barriers & Cardiac Risk Stratification - 12/21/23 1153       Activity Barriers & Cardiac Risk Stratification   Activity Barriers None    Cardiac Risk Stratification High          6 Minute Walk:  6 Minute Walk     Row Name 12/21/23 1152         6 Minute Walk   Phase Initial     Distance 1490 feet     Walk Time 6 minutes     # of Rest Breaks 0     MPH 2.82     METS 3.83     RPE 11     Perceived Dyspnea  0     VO2 Peak 13.4     Symptoms No     Resting HR 54 bpm     Resting BP 126/70     Resting Oxygen Saturation  99 %     Exercise Oxygen Saturation  during 6 min walk 99 %     Max Ex. HR 94 bpm     Max Ex. BP 156/82     2 Minute Post BP 142/80        Oxygen Initial Assessment:   Oxygen Re-Evaluation:   Oxygen Discharge (Final Oxygen Re-Evaluation):   Initial Exercise Prescription:  Initial Exercise Prescription - 12/21/23 1100        Date of Initial Exercise RX and Referring Provider   Date 12/21/23    Referring Provider Florencio Kava, MD      Oxygen   Maintain Oxygen Saturation 88% or higher      Treadmill   MPH 2.8    Grade 0    Minutes 15    METs 3.14      NuStep   Level 3   T6   SPM 80    Minutes 15    METs 3.83      Elliptical   Level 1    Speed 3    Minutes 15    METs 3.83  REL-XR   Level 3    Speed 50    Minutes 15    METs 3.83      Rower   Level 3    Watts 25    Minutes 15    METs 3.83      Prescription Details   Frequency (times per week) 3    Duration Progress to 30 minutes of continuous aerobic without signs/symptoms of physical distress      Intensity   THRR 40-80% of Max Heartrate 95-136    Ratings of Perceived Exertion 11-13    Perceived Dyspnea 0-4      Progression   Progression Continue to progress workloads to maintain intensity without signs/symptoms of physical distress.      Resistance Training   Training Prescription Yes    Weight 7 lb    Reps 10-15          Perform Capillary Blood Glucose checks as needed.  Exercise Prescription Changes:   Exercise Prescription Changes     Row Name 12/21/23 1100             Response to Exercise   Blood Pressure (Admit) 126/70       Blood Pressure (Exercise) 156/82       Blood Pressure (Exit) 142/80       Heart Rate (Admit) 54 bpm       Heart Rate (Exercise) 94 bpm       Heart Rate (Exit) 54 bpm       Oxygen Saturation (Admit) 99 %       Oxygen Saturation (Exercise) 99 %       Oxygen Saturation (Exit) 97 %       Rating of Perceived Exertion (Exercise) 11       Perceived Dyspnea (Exercise) 0       Symptoms none       Comments results       Intensity THRR New         Progression   Average METs 3.83          Exercise Comments:   Exercise Goals and Review:   Exercise Goals     Row Name 12/21/23 1158             Exercise Goals   Increase Physical Activity Yes       Intervention  Provide advice, education, support and counseling about physical activity/exercise needs.;Develop an individualized exercise prescription for aerobic and resistive training based on initial evaluation findings, risk stratification, comorbidities and participant's personal goals.       Expected Outcomes Short Term: Attend rehab on a regular basis to increase amount of physical activity.;Long Term: Add in home exercise to make exercise part of routine and to increase amount of physical activity.;Long Term: Exercising regularly at least 3-5 days a week.       Increase Strength and Stamina Yes       Intervention Provide advice, education, support and counseling about physical activity/exercise needs.;Develop an individualized exercise prescription for aerobic and resistive training based on initial evaluation findings, risk stratification, comorbidities and participant's personal goals.       Expected Outcomes Short Term: Increase workloads from initial exercise prescription for resistance, speed, and METs.;Short Term: Perform resistance training exercises routinely during rehab and add in resistance training at home;Long Term: Improve cardiorespiratory fitness, muscular endurance and strength as measured by increased METs and functional capacity ( )       Able to understand and use rate  of perceived exertion (RPE) scale Yes       Intervention Provide education and explanation on how to use RPE scale       Expected Outcomes Short Term: Able to use RPE daily in rehab to express subjective intensity level;Long Term:  Able to use RPE to guide intensity level when exercising independently       Able to understand and use Dyspnea scale Yes       Intervention Provide education and explanation on how to use Dyspnea scale       Expected Outcomes Short Term: Able to use Dyspnea scale daily in rehab to express subjective sense of shortness of breath during exertion;Long Term: Able to use Dyspnea scale to guide  intensity level when exercising independently       Knowledge and understanding of Target Heart Rate Range (THRR) Yes       Intervention Provide education and explanation of THRR including how the numbers were predicted and where they are located for reference       Expected Outcomes Short Term: Able to state/look up THRR;Short Term: Able to use daily as guideline for intensity in rehab;Long Term: Able to use THRR to govern intensity when exercising independently       Able to check pulse independently Yes       Intervention Provide education and demonstration on how to check pulse in carotid and radial arteries.;Review the importance of being able to check your own pulse for safety during independent exercise       Expected Outcomes Short Term: Able to explain why pulse checking is important during independent exercise;Long Term: Able to check pulse independently and accurately       Understanding of Exercise Prescription Yes       Intervention Provide education, explanation, and written materials on patient's individual exercise prescription       Expected Outcomes Short Term: Able to explain program exercise prescription;Long Term: Able to explain home exercise prescription to exercise independently          Exercise Goals Re-Evaluation :   Discharge Exercise Prescription (Final Exercise Prescription Changes):  Exercise Prescription Changes - 12/21/23 1100       Response to Exercise   Blood Pressure (Admit) 126/70    Blood Pressure (Exercise) 156/82    Blood Pressure (Exit) 142/80    Heart Rate (Admit) 54 bpm    Heart Rate (Exercise) 94 bpm    Heart Rate (Exit) 54 bpm    Oxygen Saturation (Admit) 99 %    Oxygen Saturation (Exercise) 99 %    Oxygen Saturation (Exit) 97 %    Rating of Perceived Exertion (Exercise) 11    Perceived Dyspnea (Exercise) 0    Symptoms none    Comments results    Intensity THRR New      Progression   Average METs 3.83          Nutrition:   Target Goals: Understanding of nutrition guidelines, daily intake of sodium 1500mg , cholesterol 200mg , calories 30% from fat and 7% or less from saturated fats, daily to have 5 or more servings of fruits and vegetables.  Education: All About Nutrition: -Group instruction provided by verbal, written material, interactive activities, discussions, models, and posters to present general guidelines for heart healthy nutrition including fat, fiber, MyPlate, the role of sodium in heart healthy nutrition, utilization of the nutrition label, and utilization of this knowledge for meal planning. Follow up email sent as well. Written material given at  graduation.   Biometrics:  Pre Biometrics - 12/21/23 1158       Pre Biometrics   Height 5' 9.5 (1.765 m)    Weight 182 lb 4.8 oz (82.7 kg)    Waist Circumference 37.5 inches    Hip Circumference 41 inches    Waist to Hip Ratio 0.91 %    BMI (Calculated) 26.54    Single Leg Stand 30 seconds           Nutrition Therapy Plan and Nutrition Goals:  Nutrition Therapy & Goals - 12/21/23 1159       Nutrition Therapy   RD appointment deferred Yes      Personal Nutrition Goals   Nutrition Goal RD appointment deferred at this time, might schedule in future      Intervention Plan   Intervention Prescribe, educate and counsel regarding individualized specific dietary modifications aiming towards targeted core components such as weight, hypertension, lipid management, diabetes, heart failure and other comorbidities.    Expected Outcomes Short Term Goal: Understand basic principles of dietary content, such as calories, fat, sodium, cholesterol and nutrients.          Nutrition Assessments:  MEDIFICTS Score Key: >=70 Need to make dietary changes  40-70 Heart Healthy Diet <= 40 Therapeutic Level Cholesterol Diet  Flowsheet Row Cardiac Rehab from 12/21/2023 in [facility] Cardiac and Pulmonary Rehab  Picture Your Plate Total Score on Admission 83    Picture Your Plate Scores: <59 Unhealthy dietary pattern with much room for improvement. 41-50 Dietary pattern unlikely to meet recommendations for good health and room for improvement. 51-60 More healthful dietary pattern, with some room for improvement.  >60 Healthy dietary pattern, although there may be some specific behaviors that could be improved.    Nutrition Goals Re-Evaluation:   Nutrition Goals Discharge (Final Nutrition Goals Re-Evaluation):   Psychosocial: Target Goals: Acknowledge presence or absence of significant depression and/or stress, maximize coping skills, provide positive support system. Participant is able to verbalize types and ability to use techniques and skills needed for reducing stress and depression.   Education: Stress, Anxiety, and Depression - Group verbal and visual presentation to define topics covered.  Reviews how body is impacted by stress, anxiety, and depression.  Also discusses healthy ways to reduce stress and to treat/manage anxiety and depression.  Written material given at graduation.   Education: Sleep Hygiene -Provides group verbal and written instruction about how sleep can affect your health.  Define sleep hygiene, discuss sleep cycles and impact of sleep habits. Review good sleep hygiene tips.    Initial Review & Psychosocial Screening:  Initial Psych Review & Screening - 12/17/23 1440       Initial Review   Current issues with None Identified      Family Dynamics   Good Support System? Yes   wife, family     Barriers   Psychosocial barriers to participate in program There are no identifiable barriers or psychosocial needs.      Screening Interventions   Interventions To provide support and resources with identified psychosocial needs;Provide feedback about the scores to participant;Encouraged to exercise    Expected Outcomes Short Term goal: Utilizing psychosocial counselor, staff and physician to assist with identification  of specific Stressors or current issues interfering with healing process. Setting desired goal for each stressor or current issue identified.;Long Term Goal: Stressors or current issues are controlled or eliminated.;Short Term goal: Identification and review with participant of any Quality of Life or Depression concerns found  by scoring the questionnaire.;Long Term goal: The participant improves quality of Life and PHQ9 Scores as seen by post scores and/or verbalization of changes          Quality of Life Scores:   Quality of Life - 12/21/23 1159       Quality of Life   Select Quality of Life      Quality of Life Scores   Health/Function Pre 24.4 %    Socioeconomic Pre 25.36 %    Psych/Spiritual Pre 24.93 %    Family Pre 28.8 %    GLOBAL Pre 25.35 %         Scores of 19 and below usually indicate a poorer quality of life in these areas.  A difference of  2-3 points is a clinically meaningful difference.  A difference of 2-3 points in the total score of the Quality of Life Index has been associated with significant improvement in overall quality of life, self-image, physical symptoms, and general health in studies assessing change in quality of life.  PHQ-9: Review Flowsheet       12/21/2023  Depression screen PHQ 2/9  Decreased Interest 0  Down, Depressed, Hopeless 0  PHQ - 2 Score 0  Altered sleeping 0  Tired, decreased energy 0  Change in appetite 0  Feeling bad or failure about yourself  0  Trouble concentrating 0  Moving slowly or fidgety/restless 0  Suicidal thoughts 0  PHQ-9 Score 0   Interpretation of Total Score  Total Score Depression Severity:  1-4 = Minimal depression, 5-9 = Mild depression, 10-14 = Moderate depression, 15-19 = Moderately severe depression, 20-27 = Severe depression   Psychosocial Evaluation and Intervention:  Psychosocial Evaluation - 12/17/23 1521       Psychosocial Evaluation & Interventions   Interventions Encouraged to exercise with  the program and follow exercise prescription    Comments There are no barriers to attending the program.  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 He has support from his wife.  He is ready to start the program and complete it with an improved fitness level.    Expected Outcomes STG attend all scheduled sessions, work with EP on exercise progression working toward improved fit level/ LTG  improves fitness level and continued exercise progression    Continue Psychosocial Services  Follow up required by staff          Psychosocial Re-Evaluation:   Psychosocial Discharge (Final Psychosocial Re-Evaluation):   Vocational Rehabilitation: Provide vocational rehab assistance to qualifying candidates.   Vocational Rehab Evaluation & Intervention:  Vocational Rehab - 12/17/23 1441       Initial Vocational Rehab Evaluation & Intervention   Assessment shows need for Vocational Rehabilitation No      Vocational Rehab Re-Evaulation   Comments no request for VR at this time          Education: Education Goals: Education classes will be provided on a variety of topics geared toward better understanding of heart health and risk factor modification. Participant will state understanding/return demonstration of topics presented as noted by education test scores.  Learning Barriers/Preferences:  Learning Barriers/Preferences - 12/17/23 1441       Learning Barriers/Preferences   Learning Barriers None    Learning Preferences None          General Cardiac Education Topics:  AED/CPR: - Group verbal and written instruction with the use of models to demonstrate the basic use of  the AED with the basic ABC's of resuscitation.   Anatomy and Cardiac Procedures: - Group verbal and visual presentation and models provide information about basic cardiac anatomy and function. Reviews the testing methods done to diagnose heart disease and the outcomes of the test results.  Describes the treatment choices: Medical Management, Angioplasty, or Coronary Bypass Surgery for treating various heart conditions including Myocardial Infarction, Angina, Valve Disease, and Cardiac Arrhythmias.  Written material given at graduation. Flowsheet Row Cardiac Rehab from 12/21/2023 in [facility] Cardiac and Pulmonary Rehab  Education need identified 12/21/23    Medication Safety: - Group verbal and visual instruction to review commonly prescribed medications for heart and lung disease. Reviews the medication, class of the drug, and side effects. Includes the steps to properly store meds and maintain the prescription regimen.  Written material given at graduation.   Intimacy: - Group verbal instruction through game format to discuss how heart and lung disease can affect sexual intimacy. Written material given at graduation..   Know Your Numbers and Heart Failure: - Group verbal and visual instruction to discuss disease risk factors for cardiac and pulmonary disease and treatment options.  Reviews associated critical values for Overweight/Obesity, Hypertension, Cholesterol, and Diabetes.  Discusses basics of heart failure: signs/symptoms and treatments.  Introduces Heart Failure Zone chart for action plan for heart failure.  Written material given at graduation.   Infection Prevention: - Provides verbal and written material to individual with discussion of infection control including proper hand washing and proper equipment cleaning during exercise session. Flowsheet Row Cardiac Rehab from 12/21/2023 in [facility] Cardiac and Pulmonary Rehab  Date 12/21/23  Educator MB  Instruction Review Code 1- Verbalizes Understanding    Falls Prevention: - Provides verbal and written material to individual with discussion of falls prevention and safety. Flowsheet Row Cardiac Rehab from 12/21/2023 in [facility] Cardiac and Pulmonary Rehab  Date 12/21/23  Educator MB  Instruction Review Code 1- Verbalizes  Understanding    Other: -Provides group and verbal instruction on various topics (see comments)   Knowledge Questionnaire Score:  Knowledge Questionnaire Score - 12/21/23 1201       Knowledge Questionnaire Score   Pre Score 25/26          Core Components/Risk Factors/Patient Goals at Admission:  Personal Goals and Risk Factors at Admission - 12/21/23 1201       Core Components/Risk Factors/Patient Goals on Admission    Weight Management Yes;Weight Maintenance    Intervention Weight Management: Develop a combined nutrition and exercise program designed to reach desired caloric intake, while maintaining appropriate intake of nutrient and fiber, sodium and fats, and appropriate energy expenditure required for the weight goal.;Weight Management: Provide education and appropriate resources to help participant work on and attain dietary goals.;Weight Management/Obesity: Establish reasonable short term and long term weight goals.    Admit Weight 182 lb 4.8 oz (82.7 kg)    Goal Weight: Short Term 182 lb 4.8 oz (82.7 kg)    Goal Weight: Long Term 182 lb 4.8 oz (82.7 kg)    Expected Outcomes Short Term: Continue to assess and modify interventions until short term weight is achieved;Long Term: Adherence to nutrition and physical activity/exercise program aimed toward attainment of established weight goal;Weight Maintenance: Understanding of the daily nutrition guidelines, which includes 25-35% calories from fat, 7% or less cal from saturated fats, less than 200mg  cholesterol, less than 1.5gm of sodium, & 5 or more servings of fruits and vegetables daily;Understanding recommendations for meals to include 15-35% energy  as protein, 25-35% energy from fat, 35-60% energy from carbohydrates, less than 200mg  of dietary cholesterol, 20-35 gm of total fiber daily;Understanding of distribution of calorie intake throughout the day with the consumption of 4-5 meals/snacks    Hypertension Yes    Intervention  Provide education on lifestyle modifcations including regular physical activity/exercise, weight management, moderate sodium restriction and increased consumption of fresh fruit, vegetables, and low fat dairy, alcohol moderation, and smoking cessation.;Monitor prescription use compliance.    Expected Outcomes Short Term: Continued assessment and intervention until BP is < 140/26mm HG in hypertensive participants. < 130/57mm HG in hypertensive participants with diabetes, heart failure or chronic kidney disease.;Long Term: Maintenance of blood pressure at goal levels.    Lipids Yes    Intervention Provide education and support for participant on nutrition & aerobic/resistive exercise along with prescribed medications to achieve LDL 70mg , HDL >40mg .    Expected Outcomes Short Term: Participant states understanding of desired cholesterol values and is compliant with medications prescribed. Participant is following exercise prescription and nutrition guidelines.;Long Term: Cholesterol controlled with medications as prescribed, with individualized exercise RX and with personalized nutrition plan. Value goals: LDL < 70mg , HDL > 40 mg.          Education:Diabetes - Individual verbal and written instruction to review signs/symptoms of diabetes, desired ranges of glucose level fasting, after meals and with exercise. Acknowledge that pre and post exercise glucose checks will be done for 3 sessions at entry of program.   Core Components/Risk Factors/Patient Goals Review:    Core Components/Risk Factors/Patient Goals at Discharge (Final Review):    ITP Comments:  ITP Comments     Row Name 12/17/23 1520 12/21/23 1152 12/23/23 1201       ITP Comments Virtual orientation call completed today. he has an appointment on  0 12/21/2023 for EP eval and gym Orientation.  Documentation of diagnosis can be found in [facility] 11/20/2023 . Completed and gym orientation for cardiac rehab. Initial ITP created and sent  for review to Dr. Oneil Pinal, Medical Director. 30 Day review completed. Medical Director ITP review done, changes made as directed, and signed approval by Medical Director. New Patient        Comments: 30 day review

## 2023-12-23 NOTE — Progress Notes (Signed)
 Daily Session Note  Patient Details  Name: Chris Shelton MRN: [id number] Date of Birth: 09-07-1960 Referring Provider:   Flowsheet Row Cardiac Rehab from 12/21/2023 in [facility] Cardiac and Pulmonary Rehab  Referring Provider Florencio Kava, MD    Encounter Date: 12/23/2023  Check In:  Session Check In - 12/23/23 1727       Check-In   Supervising physician immediately available to respond to emergencies See telemetry face sheet for immediately available ER MD    Location [facility]-Cardiac & Pulmonary Rehab    Staff Present Josette Shallow RN,BC,MSN;Shawnette Augello RN,BSN,MPA;Joseph Rolinda RCP,RRT,BSRT    Virtual Visit No    Medication changes reported     No    Fall or balance concerns reported    No    Warm-up and Cool-down Performed on first and last piece of equipment    Resistance Training Performed Yes    VAD Patient? No    PAD/SET Patient? No      Pain Assessment   Currently in Pain? No/denies             Social History   Tobacco Use  Smoking Status Never  Smokeless Tobacco Never    Goals Met:  Independence with exercise equipment Exercise tolerated well No report of concerns or symptoms today Strength training completed today  Goals Unmet:  Not Applicable  Comments: First full day of exercise!  Patient was oriented to gym and equipment including functions, settings, policies, and procedures.  Patient's individual exercise prescription and treatment plan were reviewed.  All starting workloads were established based on the results of the 6 minute walk test done at initial orientation visit.  The plan for exercise progression was also introduced and progression will be customized based on patient's performance and goals.    Dr. Oneil Pinal is Medical Director for [facility] Cardiac Rehabilitation.  Dr. Fuad Aleskerov is Medical Director for [facility] Pulmonary Rehabilitation.

## 2023-12-24 ENCOUNTER — Encounter: Admitting: *Deleted

## 2023-12-24 DIAGNOSIS — Z48812 Encounter for surgical aftercare following surgery on the circulatory system: Secondary | ICD-10-CM | POA: Diagnosis not present

## 2023-12-24 DIAGNOSIS — I214 Non-ST elevation (NSTEMI) myocardial infarction: Secondary | ICD-10-CM

## 2023-12-24 DIAGNOSIS — Z951 Presence of aortocoronary bypass graft: Secondary | ICD-10-CM

## 2023-12-24 NOTE — Progress Notes (Signed)
 Daily Session Note  Patient Details  Name: Chris Shelton MRN: [id number] Date of Birth: [birth year] Referring Provider:   Flowsheet Row Cardiac Rehab from 12/21/2023 in [facility] Cardiac and Pulmonary Rehab  Referring Provider Florencio Kava, MD    Encounter Date: 12/24/2023  Check In:  Session Check In - 12/24/23 1724       Check-In   Supervising physician immediately available to respond to emergencies See telemetry face sheet for immediately available ER MD    Location [facility]-Cardiac & Pulmonary Rehab    Staff Present Hoy Rodney RN,BSN;Joseph [facility] BS, Exercise Physiologist    Virtual Visit No    Medication changes reported     No    Fall or balance concerns reported    No    Warm-up and Cool-down Performed on first and last piece of equipment    Resistance Training Performed Yes    VAD Patient? No    PAD/SET Patient? No      Pain Assessment   Currently in Pain? No/denies             Social History   Tobacco Use  Smoking Status Never  Smokeless Tobacco Never    Goals Met:  Independence with exercise equipment Exercise tolerated well No report of concerns or symptoms today Strength training completed today  Goals Unmet:  Not Applicable  Comments: Pt able to follow exercise prescription today without complaint.  Will continue to monitor for progression.    Dr. Oneil Pinal is Medical Director for [facility] Cardiac Rehabilitation.  Dr. Fuad Aleskerov is Medical Director for [facility] Pulmonary Rehabilitation.

## 2023-12-28 ENCOUNTER — Encounter: Admitting: *Deleted

## 2023-12-28 DIAGNOSIS — Z48812 Encounter for surgical aftercare following surgery on the circulatory system: Secondary | ICD-10-CM | POA: Diagnosis not present

## 2023-12-28 DIAGNOSIS — Z951 Presence of aortocoronary bypass graft: Secondary | ICD-10-CM

## 2023-12-28 DIAGNOSIS — I214 Non-ST elevation (NSTEMI) myocardial infarction: Secondary | ICD-10-CM

## 2023-12-28 NOTE — Progress Notes (Signed)
 Daily Session Note  Patient Details  Name: RAMZI BRATHWAITE MRN: [id number] Date of Birth: 10/13/1960 Referring Provider:   Flowsheet Row Cardiac Rehab from 12/21/2023 in [facility] Cardiac and Pulmonary Rehab  Referring Provider Florencio Kava, MD    Encounter Date: 12/28/2023  Check In:  Session Check In - 12/28/23 1724       Check-In   Supervising physician immediately available to respond to emergencies See telemetry face sheet for immediately available ER MD    Location [facility]-Cardiac & Pulmonary Rehab    Staff Present Hoy Rodney RN,BSN;Joseph Rolinda NORWOOD HARMAN Cecilie Delores, [place], RRT, CPFT;Kelly Dyane BS, ACSM CEP, Exercise Physiologist    Virtual Visit No    Medication changes reported     No    Fall or balance concerns reported    No    Warm-up and Cool-down Performed on first and last piece of equipment    Resistance Training Performed Yes    VAD Patient? No    PAD/SET Patient? No      Pain Assessment   Currently in Pain? No/denies             Social History   Tobacco Use  Smoking Status Never  Smokeless Tobacco Never    Goals Met:  Independence with exercise equipment Exercise tolerated well No report of concerns or symptoms today Strength training completed today  Goals Unmet:  Not Applicable  Comments: Pt able to follow exercise prescription today without complaint.  Will continue to monitor for progression.    Dr. Oneil Pinal is Medical Director for [facility] Cardiac Rehabilitation.  Dr. Fuad Aleskerov is Medical Director for [facility] Pulmonary Rehabilitation.

## 2023-12-30 ENCOUNTER — Encounter

## 2023-12-30 DIAGNOSIS — Z951 Presence of aortocoronary bypass graft: Secondary | ICD-10-CM

## 2023-12-30 DIAGNOSIS — I214 Non-ST elevation (NSTEMI) myocardial infarction: Secondary | ICD-10-CM

## 2023-12-30 DIAGNOSIS — Z48812 Encounter for surgical aftercare following surgery on the circulatory system: Secondary | ICD-10-CM | POA: Diagnosis not present

## 2023-12-30 NOTE — Progress Notes (Signed)
 Daily Session Note  Patient Details  Name: Chris Shelton MRN: [id number] Date of Birth: 1960/11/14 Referring Provider:   Flowsheet Row Cardiac Rehab from 12/21/2023 in [facility] Cardiac and Pulmonary Rehab  Referring Provider Florencio Kava, MD    Encounter Date: 12/30/2023  Check In:  Session Check In - 12/30/23 1717       Check-In   Supervising physician immediately available to respond to emergencies See telemetry face sheet for immediately available ER MD    Location [facility]-Cardiac & Pulmonary Rehab    Staff Present Burnard Davenport RN,BSN,MPA;Joseph Rolinda RCP,RRT,BSRT;Meredith Tressa RN,BSN    Virtual Visit No    Medication changes reported     No    Fall or balance concerns reported    No    Warm-up and Cool-down Performed on first and last piece of equipment    Resistance Training Performed Yes    VAD Patient? No    PAD/SET Patient? No      Pain Assessment   Currently in Pain? No/denies             Social History   Tobacco Use  Smoking Status Never  Smokeless Tobacco Never    Goals Met:  Independence with exercise equipment Exercise tolerated well No report of concerns or symptoms today Strength training completed today  Goals Unmet:  Not Applicable  Comments: Pt able to follow exercise prescription today without complaint.  Will continue to monitor for progression.    Dr. Oneil Pinal is Medical Director for [facility] Cardiac Rehabilitation.  Dr. Fuad Aleskerov is Medical Director for [facility] Pulmonary Rehabilitation.

## 2023-12-31 ENCOUNTER — Encounter: Admitting: *Deleted

## 2023-12-31 DIAGNOSIS — Z48812 Encounter for surgical aftercare following surgery on the circulatory system: Secondary | ICD-10-CM | POA: Diagnosis not present

## 2023-12-31 DIAGNOSIS — Z951 Presence of aortocoronary bypass graft: Secondary | ICD-10-CM

## 2023-12-31 DIAGNOSIS — I214 Non-ST elevation (NSTEMI) myocardial infarction: Secondary | ICD-10-CM

## 2023-12-31 NOTE — Progress Notes (Signed)
 Daily Session Note  Patient Details  Name: Chris Shelton MRN: [id number] Date of Birth: 1960/12/04 Referring Provider:   Flowsheet Row Cardiac Rehab from 12/21/2023 in [facility] Cardiac and Pulmonary Rehab  Referring Provider Florencio Kava, MD    Encounter Date: 12/31/2023  Check In:  Session Check In - 12/31/23 1719       Check-In   Supervising physician immediately available to respond to emergencies See telemetry face sheet for immediately available ER MD    Location [facility]-Cardiac & Pulmonary Rehab    Staff Present Hoy Rodney RN,BSN;Joseph Rolinda RCP,RRT,BSRT;Kristen Coble RN,BC,MSN    Virtual Visit No    Medication changes reported     No    Fall or balance concerns reported    No    Warm-up and Cool-down Performed on first and last piece of equipment    Resistance Training Performed Yes    VAD Patient? No    PAD/SET Patient? No      Pain Assessment   Currently in Pain? No/denies             Social History   Tobacco Use  Smoking Status Never  Smokeless Tobacco Never    Goals Met:  Independence with exercise equipment Exercise tolerated well No report of concerns or symptoms today Strength training completed today  Goals Unmet:  Not Applicable  Comments: Pt able to follow exercise prescription today without complaint.  Will continue to monitor for progression.    Dr. Oneil Pinal is Medical Director for [facility] Cardiac Rehabilitation.  Dr. Fuad Aleskerov is Medical Director for [facility] Pulmonary Rehabilitation.

## 2024-01-04 ENCOUNTER — Encounter: Admitting: *Deleted

## 2024-01-04 DIAGNOSIS — Z951 Presence of aortocoronary bypass graft: Secondary | ICD-10-CM

## 2024-01-04 DIAGNOSIS — Z48812 Encounter for surgical aftercare following surgery on the circulatory system: Secondary | ICD-10-CM | POA: Diagnosis not present

## 2024-01-04 DIAGNOSIS — I214 Non-ST elevation (NSTEMI) myocardial infarction: Secondary | ICD-10-CM

## 2024-01-04 NOTE — Progress Notes (Signed)
 Daily Session Note  Patient Details  Name: Chris Shelton MRN: [id number] Date of Birth: [birth year] Referring Provider:   Flowsheet Row Cardiac Rehab from 12/21/2023 in [facility] Cardiac and Pulmonary Rehab  Referring Provider Florencio Kava, MD    Encounter Date: 01/04/2024  Check In:  Session Check In - 01/04/24 1715       Check-In   Supervising physician immediately available to respond to emergencies See telemetry face sheet for immediately available ER MD    Location [facility]-Cardiac & Pulmonary Rehab    Staff Present Fairy Plater RCP,RRT,BSRT;Malayla Granberry Tressa RN,BSN;Kelly [facility]    Virtual Visit No    Medication changes reported     No    Warm-up and Cool-down Performed on first and last piece of equipment    Resistance Training Performed Yes    VAD Patient? No    PAD/SET Patient? No      Pain Assessment   Currently in Pain? No/denies             Social History   Tobacco Use  Smoking Status Never  Smokeless Tobacco Never    Goals Met:  Independence with exercise equipment Exercise tolerated well No report of concerns or symptoms today Strength training completed today  Goals Unmet:  Not Applicable  Comments: Pt able to follow exercise prescription today without complaint.  Will continue to monitor for progression.    Dr. Oneil Pinal is Medical Director for [facility] Cardiac Rehabilitation.  Dr. Fuad Aleskerov is Medical Director for [facility] Pulmonary Rehabilitation.

## 2024-01-06 ENCOUNTER — Encounter

## 2024-01-06 DIAGNOSIS — Z48812 Encounter for surgical aftercare following surgery on the circulatory system: Secondary | ICD-10-CM | POA: Diagnosis not present

## 2024-01-06 DIAGNOSIS — I214 Non-ST elevation (NSTEMI) myocardial infarction: Secondary | ICD-10-CM

## 2024-01-06 DIAGNOSIS — Z951 Presence of aortocoronary bypass graft: Secondary | ICD-10-CM

## 2024-01-06 NOTE — Progress Notes (Signed)
 Daily Session Note  Patient Details  Name: Chris Shelton MRN: [id number] Date of Birth: January 30, 1961 Referring Provider:   Flowsheet Row Cardiac Rehab from 12/21/2023 in [facility] Cardiac and Pulmonary Rehab  Referring Provider Florencio Kava, MD    Encounter Date: 01/06/2024  Check In:  Session Check In - 01/06/24 1705       Check-In   Supervising physician immediately available to respond to emergencies See telemetry face sheet for immediately available ER MD    Location [facility]-Cardiac & Pulmonary Rehab    Staff Present Burnard Davenport RN,BSN,MPA;Joseph [facility] RCP,RRT,BSRT;Sophiamarie Nease Dyane BS, ACSM CEP, Exercise Physiologist    Virtual Visit No    Medication changes reported     No    Fall or balance concerns reported    No    Warm-up and Cool-down Performed on first and last piece of equipment    Resistance Training Performed Yes    VAD Patient? No    PAD/SET Patient? No      Pain Assessment   Currently in Pain? No/denies             Social History   Tobacco Use  Smoking Status Never  Smokeless Tobacco Never    Goals Met:  Independence with exercise equipment Exercise tolerated well No report of concerns or symptoms today Strength training completed today  Goals Unmet:  Not Applicable  Comments: Pt able to follow exercise prescription today without complaint.  Will continue to monitor for progression.    Dr. Oneil Pinal is Medical Director for [facility] Cardiac Rehabilitation.  Dr. Fuad Aleskerov is Medical Director for [facility] Pulmonary Rehabilitation.

## 2024-01-07 ENCOUNTER — Encounter: Admitting: *Deleted

## 2024-01-07 DIAGNOSIS — Z951 Presence of aortocoronary bypass graft: Secondary | ICD-10-CM

## 2024-01-07 DIAGNOSIS — I214 Non-ST elevation (NSTEMI) myocardial infarction: Secondary | ICD-10-CM

## 2024-01-07 NOTE — Progress Notes (Signed)
 Daily Session Note  Patient Details  Name: Chris Shelton MRN: [id number] Date of Birth: [birth year] Referring Provider:   Flowsheet Row Cardiac Rehab from 12/21/2023 in [facility] Cardiac and Pulmonary Rehab  Referring Provider Florencio Kava, MD    Encounter Date: 01/07/2024  Check In:  Session Check In - 01/07/24 1710       Check-In   Supervising physician immediately available to respond to emergencies See telemetry face sheet for immediately available ER MD    Location [facility]-Cardiac & Pulmonary Rehab    Staff Present Hoy Rodney RN,BSN;Joseph [facility] BS, Exercise Physiologist;Kristen Coble RN,BC,MSN    Virtual Visit No    Medication changes reported     No    Fall or balance concerns reported    No    Warm-up and Cool-down Performed on first and last piece of equipment    Resistance Training Performed Yes    VAD Patient? No    PAD/SET Patient? No      Pain Assessment   Currently in Pain? No/denies             Social History   Tobacco Use  Smoking Status Never  Smokeless Tobacco Never    Goals Met:  Independence with exercise equipment Exercise tolerated well No report of concerns or symptoms today Strength training completed today  Goals Unmet:  Not Applicable  Comments: Pt able to follow exercise prescription today without complaint.  Will continue to monitor for progression.    Dr. Oneil Pinal is Medical Director for [facility] Cardiac Rehabilitation.  Dr. Fuad Aleskerov is Medical Director for [facility] Pulmonary Rehabilitation.

## 2024-01-11 ENCOUNTER — Encounter: Admitting: *Deleted

## 2024-01-11 DIAGNOSIS — I214 Non-ST elevation (NSTEMI) myocardial infarction: Secondary | ICD-10-CM

## 2024-01-11 DIAGNOSIS — Z48812 Encounter for surgical aftercare following surgery on the circulatory system: Secondary | ICD-10-CM | POA: Diagnosis not present

## 2024-01-11 DIAGNOSIS — Z951 Presence of aortocoronary bypass graft: Secondary | ICD-10-CM

## 2024-01-11 NOTE — Progress Notes (Signed)
 Daily Session Note  Patient Details  Name: NYGEL PROKOP MRN: [id number] Date of Birth: [birth year] Referring Provider:   Flowsheet Row Cardiac Rehab from 12/21/2023 in [facility] Cardiac and Pulmonary Rehab  Referring Provider Florencio Kava, MD    Encounter Date: 01/11/2024  Check In:  Session Check In - 01/11/24 1728       Check-In   Supervising physician immediately available to respond to emergencies See telemetry face sheet for immediately available ER MD    Location [facility]-Cardiac & Pulmonary Rehab    Staff Present Hoy Rodney RN,BSN;Joseph [facility] Dyane BS, ACSM CEP, Exercise Physiologist    Virtual Visit No    Medication changes reported     No    Fall or balance concerns reported    No    Warm-up and Cool-down Performed on first and last piece of equipment    Resistance Training Performed Yes    VAD Patient? No    PAD/SET Patient? No      Pain Assessment   Currently in Pain? No/denies             Social History   Tobacco Use  Smoking Status Never  Smokeless Tobacco Never    Goals Met:  Independence with exercise equipment Exercise tolerated well No report of concerns or symptoms today Strength training completed today  Goals Unmet:  Not Applicable  Comments: Pt able to follow exercise prescription today without complaint.  Will continue to monitor for progression.    Dr. Oneil Pinal is Medical Director for [facility] Cardiac Rehabilitation.  Dr. Fuad Aleskerov is Medical Director for [facility] Pulmonary Rehabilitation.

## 2024-01-13 ENCOUNTER — Encounter

## 2024-01-13 DIAGNOSIS — Z48812 Encounter for surgical aftercare following surgery on the circulatory system: Secondary | ICD-10-CM | POA: Diagnosis not present

## 2024-01-13 DIAGNOSIS — I214 Non-ST elevation (NSTEMI) myocardial infarction: Secondary | ICD-10-CM

## 2024-01-13 DIAGNOSIS — Z951 Presence of aortocoronary bypass graft: Secondary | ICD-10-CM

## 2024-01-13 NOTE — Progress Notes (Signed)
 Daily Session Note  Patient Details  Name: Chris Shelton MRN: [id number] Date of Birth: 1961/01/16 Referring Provider:   Flowsheet Row Cardiac Rehab from 12/21/2023 in [facility] Cardiac and Pulmonary Rehab  Referring Provider Florencio Kava, MD    Encounter Date: 01/13/2024  Check In:  Session Check In - 01/13/24 1714       Check-In   Supervising physician immediately available to respond to emergencies See telemetry face sheet for immediately available ER MD    Location [facility]-Cardiac & Pulmonary Rehab    Staff Present Burnard Davenport RN,BSN,MPA;Joseph [facility] RCP,RRT,BSRT;Diana Davenport Dyane BS, ACSM CEP, Exercise Physiologist    Virtual Visit No    Medication changes reported     No    Fall or balance concerns reported    No    Warm-up and Cool-down Performed on first and last piece of equipment    Resistance Training Performed Yes    VAD Patient? No    PAD/SET Patient? No      Pain Assessment   Currently in Pain? No/denies             Social History   Tobacco Use  Smoking Status Never  Smokeless Tobacco Never    Goals Met:  Independence with exercise equipment Exercise tolerated well No report of concerns or symptoms today Strength training completed today  Goals Unmet:  Not Applicable  Comments: Pt able to follow exercise prescription today without complaint.  Will continue to monitor for progression.    Dr. Oneil Pinal is Medical Director for [facility] Cardiac Rehabilitation.  Dr. Fuad Aleskerov is Medical Director for [facility] Pulmonary Rehabilitation.

## 2024-01-14 ENCOUNTER — Encounter: Admitting: *Deleted

## 2024-01-14 DIAGNOSIS — Z951 Presence of aortocoronary bypass graft: Secondary | ICD-10-CM

## 2024-01-14 DIAGNOSIS — I214 Non-ST elevation (NSTEMI) myocardial infarction: Secondary | ICD-10-CM

## 2024-01-14 DIAGNOSIS — Z48812 Encounter for surgical aftercare following surgery on the circulatory system: Secondary | ICD-10-CM | POA: Diagnosis not present

## 2024-01-14 NOTE — Progress Notes (Signed)
 Daily Session Note  Patient Details  Name: Chris Shelton MRN: [id number] Date of Birth: 11-Jul-1960 Referring Provider:   Flowsheet Row Cardiac Rehab from 12/21/2023 in [facility] Cardiac and Pulmonary Rehab  Referring Provider Florencio Kava, MD    Encounter Date: 01/14/2024  Check In:  Session Check In - 01/14/24 1738       Check-In   Supervising physician immediately available to respond to emergencies See telemetry face sheet for immediately available ER MD    Location [facility]-Cardiac & Pulmonary Rehab    Staff Present Hoy Rodney RN,BSN;Maxon Burnell BS, Exercise Physiologist;Margaret Best, MS, Exercise Physiologist    Virtual Visit No    Medication changes reported     No    Fall or balance concerns reported    No    Warm-up and Cool-down Performed on first and last piece of equipment    Resistance Training Performed Yes    VAD Patient? No    PAD/SET Patient? No      Pain Assessment   Currently in Pain? No/denies             Social History   Tobacco Use  Smoking Status Never  Smokeless Tobacco Never    Goals Met:  Independence with exercise equipment Exercise tolerated well No report of concerns or symptoms today Strength training completed today  Goals Unmet:  Not Applicable  Comments: Pt able to follow exercise prescription today without complaint.  Will continue to monitor for progression.    Dr. Oneil Pinal is Medical Director for [facility] Cardiac Rehabilitation.  Dr. Fuad Aleskerov is Medical Director for [facility] Pulmonary Rehabilitation.

## 2024-01-18 ENCOUNTER — Encounter: Admitting: *Deleted

## 2024-01-18 DIAGNOSIS — Z951 Presence of aortocoronary bypass graft: Secondary | ICD-10-CM

## 2024-01-18 DIAGNOSIS — Z48812 Encounter for surgical aftercare following surgery on the circulatory system: Secondary | ICD-10-CM | POA: Diagnosis not present

## 2024-01-18 DIAGNOSIS — I214 Non-ST elevation (NSTEMI) myocardial infarction: Secondary | ICD-10-CM

## 2024-01-18 NOTE — Progress Notes (Signed)
 Daily Session Note  Patient Details  Name: Chris Shelton MRN: [id number] Date of Birth: [birth year] Referring Provider:   Flowsheet Row Cardiac Rehab from 12/21/2023 in [facility] Cardiac and Pulmonary Rehab  Referring Provider Florencio Kava, MD    Encounter Date: 01/18/2024  Check In:  Session Check In - 01/18/24 1728       Check-In   Supervising physician immediately available to respond to emergencies See telemetry face sheet for immediately available ER MD    Location [facility]-Cardiac & Pulmonary Rehab    Staff Present Hoy Rodney RN,BSN;Joseph [facility] Dyane BS, ACSM CEP, Exercise Physiologist    Virtual Visit No    Medication changes reported     No    Fall or balance concerns reported    No    Warm-up and Cool-down Performed on first and last piece of equipment    Resistance Training Performed Yes    VAD Patient? No    PAD/SET Patient? No      Pain Assessment   Currently in Pain? No/denies             Social History   Tobacco Use  Smoking Status Never  Smokeless Tobacco Never    Goals Met:  Independence with exercise equipment Exercise tolerated well No report of concerns or symptoms today Strength training completed today  Goals Unmet:  Not Applicable  Comments: Pt able to follow exercise prescription today without complaint.  Will continue to monitor for progression.    Dr. Oneil Pinal is Medical Director for [facility] Cardiac Rehabilitation.  Dr. Fuad Aleskerov is Medical Director for [facility] Pulmonary Rehabilitation.

## 2024-01-20 ENCOUNTER — Encounter

## 2024-01-20 DIAGNOSIS — Z951 Presence of aortocoronary bypass graft: Secondary | ICD-10-CM

## 2024-01-20 DIAGNOSIS — I214 Non-ST elevation (NSTEMI) myocardial infarction: Secondary | ICD-10-CM

## 2024-01-20 DIAGNOSIS — Z48812 Encounter for surgical aftercare following surgery on the circulatory system: Secondary | ICD-10-CM | POA: Diagnosis not present

## 2024-01-20 NOTE — Progress Notes (Unsigned)
 Daily Session Note  Patient Details  Name: Chris Shelton MRN: [id number] Date of Birth: 06/16/1961 Referring Provider:   Flowsheet Row Cardiac Rehab from 12/21/2023 in [facility] Cardiac and Pulmonary Rehab  Referring Provider Florencio Kava, MD    Encounter Date: 01/20/2024  Check In:  Session Check In - 01/20/24 1725       Check-In   Supervising physician immediately available to respond to emergencies See telemetry face sheet for immediately available ER MD    Location [facility]-Cardiac & Pulmonary Rehab    Staff Present Burnard Davenport RN,BSN,MPA;Joseph [facility] Dyane BS, ACSM CEP, Exercise Physiologist    Virtual Visit No    Medication changes reported     Yes    Comments no longer taking metoprolol  or prilosec     Fall or balance concerns reported    No    Warm-up and Cool-down Performed on first and last piece of equipment    Resistance Training Performed Yes    VAD Patient? No    PAD/SET Patient? No      Pain Assessment   Currently in Pain? No/denies             Social History   Tobacco Use  Smoking Status Never  Smokeless Tobacco Never    Goals Met:  Independence with exercise equipment Exercise tolerated well No report of concerns or symptoms today Strength training completed today  Goals Unmet:  Not Applicable  Comments: Pt able to follow exercise prescription today without complaint.  Will continue to monitor for progression.     Dr. Oneil Pinal is Medical Director for [facility] Cardiac Rehabilitation.  Dr. Fuad Aleskerov is Medical Director for [facility] Pulmonary Rehabilitation.

## 2024-01-20 NOTE — Progress Notes (Signed)
 Cardiac Individual Treatment Plan  Patient Details  Name: Chris Shelton MRN: [id number] Date of Birth: March 18, 1961 Referring Provider:   Flowsheet Row Cardiac Rehab from 12/21/2023 in [facility] Cardiac and Pulmonary Rehab  Referring Provider Florencio Kava, MD    Initial Encounter Date:  Flowsheet Row Cardiac Rehab from 12/21/2023 in [facility] Cardiac and Pulmonary Rehab  Date 12/21/23    Visit Diagnosis: NSTEMI (non-ST elevation myocardial infarction) (HCC)  S/P CABG x 3  Patient's Home Medications on Admission:  Current Outpatient Medications:    aspirin  EC 81 MG tablet, Take 1 tablet (81 mg total) by mouth daily. Swallow whole., Disp: 30 tablet, Rfl: 12   atorvastatin  (LIPITOR ) 80 MG tablet, Take 80 mg by mouth at bedtime., Disp: , Rfl:    brimonidine  (ALPHAGAN ) 0.2 % ophthalmic solution, Place 1 drop into both eyes 2 (two) times daily., Disp: , Rfl:    cholecalciferol (VITAMIN D3) 25 MCG (1000 UNIT) tablet, Take 1,000 Units by mouth daily as needed., Disp: , Rfl:    Coenzyme Q10 (CO Q 10 PO), Take 1 capsule by mouth daily as needed., Disp: , Rfl:    furosemide (LASIX) 40 MG tablet, Take 40 mg by mouth daily., Disp: , Rfl:    isosorbide  mononitrate (IMDUR ) 30 MG 24 hr tablet, Take 1 tablet (30 mg total) by mouth daily. (Patient not taking: Reported on 12/17/2023), Disp: 30 tablet, Rfl: 1   metoprolol  tartrate (LOPRESSOR ) 25 MG tablet, Take 12.5 mg by mouth daily., Disp: , Rfl:    Multiple Vitamins-Minerals (MULTIVITAMIN ADULT) CHEW, Chew 1 tablet by mouth daily as needed., Disp: , Rfl:    nitroGLYCERIN  (NITROSTAT ) 0.4 MG SL tablet, Place 1 tablet (0.4 mg total) under the tongue every 5 (five) minutes as needed for chest pain., Disp: 30 tablet, Rfl: 1   Omega-3 Fatty Acids (FISH OIL PO), Take 1 capsule by mouth daily as needed., Disp: , Rfl:    omeprazole  (PRILOSEC ) 20 MG capsule, Take 20 mg by mouth daily., Disp: , Rfl:    potassium chloride SA (KLOR-CON M) 20 MEQ tablet, Take 20 mEq by mouth  daily., Disp: , Rfl:   Past Medical History: Past Medical History:  Diagnosis Date   Cataracts, bilateral 2019   Detached retina    Palpitations    Sinus trouble 2019   Stroke [facility])    TIA (transient ischemic attack)     Tobacco Use: Social History   Tobacco Use  Smoking Status Never  Smokeless Tobacco Never    Labs: Review Flowsheet       Latest Ref Rng & Units 07/10/2021 11/04/2021 12/19/2023  Labs for ITP Cardiac and Pulmonary Rehab  Cholestrol 0 - 200 mg/dL 778  784  94   LDL (calc) 0 - 99 mg/dL 849  856  34   HDL-C >59 mg/dL 39  43  31   Trlycerides <150 mg/dL 840  853  852   Hemoglobin A1c 4.8 - 5.6 % 5.6  - -     Exercise Target Goals: Exercise Program Goal: Individual exercise prescription set using results from initial 6 min walk test and THRR while considering  patient's activity barriers and safety.   Exercise Prescription Goal: Initial exercise prescription builds to 30-45 minutes a day of aerobic activity, 2-3 days per week.  Home exercise guidelines will be given to patient during program as part of exercise prescription that the participant will acknowledge.   Education: Aerobic Exercise: - Group verbal and visual presentation on the components of  exercise prescription. Introduces F.I.T.T principle from ACSM for exercise prescriptions.  Reviews F.I.T.T. principles of aerobic exercise including progression. Written material given at graduation.   Education: Resistance Exercise: - Group verbal and visual presentation on the components of exercise prescription. Introduces F.I.T.T principle from ACSM for exercise prescriptions  Reviews F.I.T.T. principles of resistance exercise including progression. Written material given at graduation.    Education: Exercise & Equipment Safety: - Individual verbal instruction and demonstration of equipment use and safety with use of the equipment. Flowsheet Row Cardiac Rehab from 12/21/2023 in [facility] Cardiac and Pulmonary  Rehab  Date 12/21/23  Educator MB  Instruction Review Code 1- Verbalizes Understanding    Education: Exercise Physiology & General Exercise Guidelines: - Group verbal and written instruction with models to review the exercise physiology of the cardiovascular system and associated critical values. Provides general exercise guidelines with specific guidelines to those with heart or lung disease.    Education: Flexibility, Balance, Mind/Body Relaxation: - Group verbal and visual presentation with interactive activity on the components of exercise prescription. Introduces F.I.T.T principle from ACSM for exercise prescriptions. Reviews F.I.T.T. principles of flexibility and balance exercise training including progression. Also discusses the mind body connection.  Reviews various relaxation techniques to help reduce and manage stress (i.e. Deep breathing, progressive muscle relaxation, and visualization). Balance handout provided to take home. Written material given at graduation.   Activity Barriers & Risk Stratification:  Activity Barriers & Cardiac Risk Stratification - 12/21/23 1153       Activity Barriers & Cardiac Risk Stratification   Activity Barriers None    Cardiac Risk Stratification High          6 Minute Walk:  6 Minute Walk     Row Name 12/21/23 1152         6 Minute Walk   Phase Initial     Distance 1490 feet     Walk Time 6 minutes     # of Rest Breaks 0     MPH 2.82     METS 3.83     RPE 11     Perceived Dyspnea  0     VO2 Peak 13.4     Symptoms No     Resting HR 54 bpm     Resting BP 126/70     Resting Oxygen Saturation  99 %     Exercise Oxygen Saturation  during 6 min walk 99 %     Max Ex. HR 94 bpm     Max Ex. BP 156/82     2 Minute Post BP 142/80        Oxygen Initial Assessment:   Oxygen Re-Evaluation:   Oxygen Discharge (Final Oxygen Re-Evaluation):   Initial Exercise Prescription:  Initial Exercise Prescription - 12/21/23 1100        Date of Initial Exercise RX and Referring Provider   Date 12/21/23    Referring Provider Florencio Kava, MD      Oxygen   Maintain Oxygen Saturation 88% or higher      Treadmill   MPH 2.8    Grade 0    Minutes 15    METs 3.14      NuStep   Level 3   T6   SPM 80    Minutes 15    METs 3.83      Elliptical   Level 1    Speed 3    Minutes 15    METs 3.83  REL-XR   Level 3    Speed 50    Minutes 15    METs 3.83      Rower   Level 3    Watts 25    Minutes 15    METs 3.83      Prescription Details   Frequency (times per week) 3    Duration Progress to 30 minutes of continuous aerobic without signs/symptoms of physical distress      Intensity   THRR 40-80% of Max Heartrate 95-136    Ratings of Perceived Exertion 11-13    Perceived Dyspnea 0-4      Progression   Progression Continue to progress workloads to maintain intensity without signs/symptoms of physical distress.      Resistance Training   Training Prescription Yes    Weight 7 lb    Reps 10-15          Perform Capillary Blood Glucose checks as needed.  Exercise Prescription Changes:   Exercise Prescription Changes     Row Name 12/21/23 1100 01/07/24 1100 01/19/24 1500         Response to Exercise   Blood Pressure (Admit) 126/70 122/60 112/54     Blood Pressure (Exercise) 156/82 158/80 156/72     Blood Pressure (Exit) 142/80 112/56 106/54     Heart Rate (Admit) 54 bpm 58 bpm 76 bpm     Heart Rate (Exercise) 94 bpm 113 bpm 120 bpm     Heart Rate (Exit) 54 bpm 65 bpm 61 bpm     Oxygen Saturation (Admit) 99 % -- --     Oxygen Saturation (Exercise) 99 % -- --     Oxygen Saturation (Exit) 97 % -- --     Rating of Perceived Exertion (Exercise) 11 13 15      Perceived Dyspnea (Exercise) 0 -- --     Symptoms none none none     Comments results 1st 2 weeks of exercise sessions --     Duration -- Continue with 30 min of aerobic exercise without signs/symptoms of physical distress.  Continue with 30 min of aerobic exercise without signs/symptoms of physical distress.     Intensity THRR New THRR unchanged THRR unchanged       Progression   Progression -- Continue to progress workloads to maintain intensity without signs/symptoms of physical distress. Continue to progress workloads to maintain intensity without signs/symptoms of physical distress.     Average METs 3.83 3.13 4       Resistance Training   Training Prescription -- Yes Yes     Weight -- 10 lb 10 lb     Reps -- 10-15 10-15       Interval Training   Interval Training -- No No       Treadmill   MPH -- 3 2.8     Grade -- 0 2     Minutes -- 15 15     METs -- 3.3 3.91       NuStep   Level -- 5  T6 5  T6     Minutes -- 15 15     METs -- 3 4       Elliptical   Level -- 1 3     Speed -- 1 4     Minutes -- 15 15     METs -- 4.4 5.5       REL-XR   Level -- 6 6     Minutes -- 15  15     METs -- 3.4 4.4       Oxygen   Maintain Oxygen Saturation -- 88% or higher 88% or higher        Exercise Comments:   Exercise Comments     Row Name 12/23/23 1727           Exercise Comments First full day of exercise!  Patient was oriented to gym and equipment including functions, settings, policies, and procedures.  Patient's individual exercise prescription and treatment plan were reviewed.  All starting workloads were established based on the results of the 6 minute walk test done at initial orientation visit.  The plan for exercise progression was also introduced and progression will be customized based on patient's performance and goals.          Exercise Goals and Review:   Exercise Goals     Row Name 12/21/23 1158             Exercise Goals   Increase Physical Activity Yes       Intervention Provide advice, education, support and counseling about physical activity/exercise needs.;Develop an individualized exercise prescription for aerobic and resistive training based on initial evaluation  findings, risk stratification, comorbidities and participant's personal goals.       Expected Outcomes Short Term: Attend rehab on a regular basis to increase amount of physical activity.;Long Term: Add in home exercise to make exercise part of routine and to increase amount of physical activity.;Long Term: Exercising regularly at least 3-5 days a week.       Increase Strength and Stamina Yes       Intervention Provide advice, education, support and counseling about physical activity/exercise needs.;Develop an individualized exercise prescription for aerobic and resistive training based on initial evaluation findings, risk stratification, comorbidities and participant's personal goals.       Expected Outcomes Short Term: Increase workloads from initial exercise prescription for resistance, speed, and METs.;Short Term: Perform resistance training exercises routinely during rehab and add in resistance training at home;Long Term: Improve cardiorespiratory fitness, muscular endurance and strength as measured by increased METs and functional capacity ( )       Able to understand and use rate of perceived exertion (RPE) scale Yes       Intervention Provide education and explanation on how to use RPE scale       Expected Outcomes Short Term: Able to use RPE daily in rehab to express subjective intensity level;Long Term:  Able to use RPE to guide intensity level when exercising independently       Able to understand and use Dyspnea scale Yes       Intervention Provide education and explanation on how to use Dyspnea scale       Expected Outcomes Short Term: Able to use Dyspnea scale daily in rehab to express subjective sense of shortness of breath during exertion;Long Term: Able to use Dyspnea scale to guide intensity level when exercising independently       Knowledge and understanding of Target Heart Rate Range (THRR) Yes       Intervention Provide education and explanation of THRR including how the numbers  were predicted and where they are located for reference       Expected Outcomes Short Term: Able to state/look up THRR;Short Term: Able to use daily as guideline for intensity in rehab;Long Term: Able to use THRR to govern intensity when exercising independently       Able to check pulse independently Yes  Intervention Provide education and demonstration on how to check pulse in carotid and radial arteries.;Review the importance of being able to check your own pulse for safety during independent exercise       Expected Outcomes Short Term: Able to explain why pulse checking is important during independent exercise;Long Term: Able to check pulse independently and accurately       Understanding of Exercise Prescription Yes       Intervention Provide education, explanation, and written materials on patient's individual exercise prescription       Expected Outcomes Short Term: Able to explain program exercise prescription;Long Term: Able to explain home exercise prescription to exercise independently          Exercise Goals Re-Evaluation :  Exercise Goals Re-Evaluation     Row Name 12/23/23 1727 01/07/24 1114 01/19/24 1510         Exercise Goal Re-Evaluation   Exercise Goals Review Increase Physical Activity;Able to understand and use rate of perceived exertion (RPE) scale;Knowledge and understanding of Target Heart Rate Range (THRR);Understanding of Exercise Prescription;Increase Strength and Stamina;Able to understand and use Dyspnea scale;Able to check pulse independently Increase Physical Activity;Understanding of Exercise Prescription;Increase Strength and Stamina Increase Physical Activity;Understanding of Exercise Prescription;Increase Strength and Stamina     Comments Reviewed RPE and dyspnea scale, THR and program prescription with pt today.  Pt voiced understanding and was given a copy of goals to take home. Chris Shelton is off to a good start in the program. He tolerated his exercise  prescription well and increased his workloads already. He increased his workload on the treadmill to a speed of 3 mph with no incline. He increased to level 5 on the T6 nustep and level 6 on the XR. He worked at level 1 on the elliptical. He also increased his handweights to 10lbs. We will continue to monitor his progress in the program. Chris Shelton is doing well in rehab. He increased his workload on the treadmill to a speed of 2.8 mph and incline of 2%. He increased to level 3 on the elliptical with a speed of 4 mph. He maintained level 6 on the XR and level 5 on the T6 nustep. We will continue to monitor his progress in the program.     Expected Outcomes Short: Use RPE daily to regulate intensity. Long: Follow program prescription in THR. Short: Continue to follow current exercise prescription. Long: Continue exercise to improve strength and stamina. Short: Continue to progressively increase treadmill and XR workloads. Long: Continue exercise to improve strength and stamina.        Discharge Exercise Prescription (Final Exercise Prescription Changes):  Exercise Prescription Changes - 01/19/24 1500       Response to Exercise   Blood Pressure (Admit) 112/54    Blood Pressure (Exercise) 156/72    Blood Pressure (Exit) 106/54    Heart Rate (Admit) 76 bpm    Heart Rate (Exercise) 120 bpm    Heart Rate (Exit) 61 bpm    Rating of Perceived Exertion (Exercise) 15    Symptoms none    Duration Continue with 30 min of aerobic exercise without signs/symptoms of physical distress.    Intensity THRR unchanged      Progression   Progression Continue to progress workloads to maintain intensity without signs/symptoms of physical distress.    Average METs 4      Resistance Training   Training Prescription Yes    Weight 10 lb    Reps 10-15  Interval Training   Interval Training No      Treadmill   MPH 2.8    Grade 2    Minutes 15    METs 3.91      NuStep   Level 5   T6   Minutes 15    METs 4       Elliptical   Level 3    Speed 4    Minutes 15    METs 5.5      REL-XR   Level 6    Minutes 15    METs 4.4      Oxygen   Maintain Oxygen Saturation 88% or higher          Nutrition:  Target Goals: Understanding of nutrition guidelines, daily intake of sodium 1500mg , cholesterol 200mg , calories 30% from fat and 7% or less from saturated fats, daily to have 5 or more servings of fruits and vegetables.  Education: All About Nutrition: -Group instruction provided by verbal, written material, interactive activities, discussions, models, and posters to present general guidelines for heart healthy nutrition including fat, fiber, MyPlate, the role of sodium in heart healthy nutrition, utilization of the nutrition label, and utilization of this knowledge for meal planning. Follow up email sent as well. Written material given at graduation.   Biometrics:  Pre Biometrics - 12/21/23 1158       Pre Biometrics   Height 5' 9.5 (1.765 m)    Weight 182 lb 4.8 oz (82.7 kg)    Waist Circumference 37.5 inches    Hip Circumference 41 inches    Waist to Hip Ratio 0.91 %    BMI (Calculated) 26.54    Single Leg Stand 30 seconds           Nutrition Therapy Plan and Nutrition Goals:  Nutrition Therapy & Goals - 12/21/23 1159       Nutrition Therapy   RD appointment deferred Yes      Personal Nutrition Goals   Nutrition Goal RD appointment deferred at this time, might schedule in future      Intervention Plan   Intervention Prescribe, educate and counsel regarding individualized specific dietary modifications aiming towards targeted core components such as weight, hypertension, lipid management, diabetes, heart failure and other comorbidities.    Expected Outcomes Short Term Goal: Understand basic principles of dietary content, such as calories, fat, sodium, cholesterol and nutrients.          Nutrition Assessments:  MEDIFICTS Score Key: >=70 Need to make dietary changes   40-70 Heart Healthy Diet <= 40 Therapeutic Level Cholesterol Diet  Flowsheet Row Cardiac Rehab from 12/21/2023 in [facility] Cardiac and Pulmonary Rehab  Picture Your Plate Total Score on Admission 83   Picture Your Plate Scores: <59 Unhealthy dietary pattern with much room for improvement. 41-50 Dietary pattern unlikely to meet recommendations for good health and room for improvement. 51-60 More healthful dietary pattern, with some room for improvement.  >60 Healthy dietary pattern, although there may be some specific behaviors that could be improved.    Nutrition Goals Re-Evaluation:  Nutrition Goals Re-Evaluation     Row Name 01/11/24 1732             Goals   Nutrition Goal RD appointment deferred.          Nutrition Goals Discharge (Final Nutrition Goals Re-Evaluation):  Nutrition Goals Re-Evaluation - 01/11/24 1732       Goals   Nutrition Goal RD appointment deferred.  Psychosocial: Target Goals: Acknowledge presence or absence of significant depression and/or stress, maximize coping skills, provide positive support system. Participant is able to verbalize types and ability to use techniques and skills needed for reducing stress and depression.   Education: Stress, Anxiety, and Depression - Group verbal and visual presentation to define topics covered.  Reviews how body is impacted by stress, anxiety, and depression.  Also discusses healthy ways to reduce stress and to treat/manage anxiety and depression.  Written material given at graduation.   Education: Sleep Hygiene -Provides group verbal and written instruction about how sleep can affect your health.  Define sleep hygiene, discuss sleep cycles and impact of sleep habits. Review good sleep hygiene tips.    Initial Review & Psychosocial Screening:  Initial Psych Review & Screening - 12/17/23 1440       Initial Review   Current issues with None Identified      Family Dynamics   Good Support System?  Yes   wife, family     Barriers   Psychosocial barriers to participate in program There are no identifiable barriers or psychosocial needs.      Screening Interventions   Interventions To provide support and resources with identified psychosocial needs;Provide feedback about the scores to participant;Encouraged to exercise    Expected Outcomes Short Term goal: Utilizing psychosocial counselor, staff and physician to assist with identification of specific Stressors or current issues interfering with healing process. Setting desired goal for each stressor or current issue identified.;Long Term Goal: Stressors or current issues are controlled or eliminated.;Short Term goal: Identification and review with participant of any Quality of Life or Depression concerns found by scoring the questionnaire.;Long Term goal: The participant improves quality of Life and PHQ9 Scores as seen by post scores and/or verbalization of changes          Quality of Life Scores:   Quality of Life - 12/21/23 1159       Quality of Life   Select Quality of Life      Quality of Life Scores   Health/Function Pre 24.4 %    Socioeconomic Pre 25.36 %    Psych/Spiritual Pre 24.93 %    Family Pre 28.8 %    GLOBAL Pre 25.35 %         Scores of 19 and below usually indicate a poorer quality of life in these areas.  A difference of  2-3 points is a clinically meaningful difference.  A difference of 2-3 points in the total score of the Quality of Life Index has been associated with significant improvement in overall quality of life, self-image, physical symptoms, and general health in studies assessing change in quality of life.  PHQ-9: Review Flowsheet       12/21/2023  Depression screen PHQ 2/9  Decreased Interest 0  Down, Depressed, Hopeless 0  PHQ - 2 Score 0  Altered sleeping 0  Tired, decreased energy 0  Change in appetite 0  Feeling bad or failure about yourself  0  Trouble concentrating 0  Moving slowly  or fidgety/restless 0  Suicidal thoughts 0  PHQ-9 Score 0   Interpretation of Total Score  Total Score Depression Severity:  1-4 = Minimal depression, 5-9 = Mild depression, 10-14 = Moderate depression, 15-19 = Moderately severe depression, 20-27 = Severe depression   Psychosocial Evaluation and Intervention:  Psychosocial Evaluation - 12/17/23 1521       Psychosocial Evaluation & Interventions   Interventions Encouraged to exercise with the program and  follow exercise prescription    Comments There are no barriers to attending the program.  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 He has support from his wife.  He is ready to start the program and complete it with an improved fitness level.    Expected Outcomes STG attend all scheduled sessions, work with EP on exercise progression working toward improved fit level/ LTG  improves fitness level and continued exercise progression    Continue Psychosocial Services  Follow up required by staff          Psychosocial Re-Evaluation:  Psychosocial Re-Evaluation     Row Name 01/11/24 1732             Psychosocial Re-Evaluation   Current issues with None Identified       Comments Patient reports no issues with their current mental states, sleep, stress, depression or anxiety. Will follow up with patient in a few weeks for any changes.       Expected Outcomes Short: Continue to exercise regularly to support mental health and notify staff of any changes. Long: maintain mental health and well being through teaching of rehab or prescribed medications independently.       Interventions Encouraged to attend Cardiac Rehabilitation for the exercise       Continue Psychosocial Services  Follow up required by staff          Psychosocial Discharge (Final Psychosocial Re-Evaluation):  Psychosocial Re-Evaluation - 01/11/24 1732       Psychosocial Re-Evaluation   Current issues with None Identified    Comments  Patient reports no issues with their current mental states, sleep, stress, depression or anxiety. Will follow up with patient in a few weeks for any changes.    Expected Outcomes Short: Continue to exercise regularly to support mental health and notify staff of any changes. Long: maintain mental health and well being through teaching of rehab or prescribed medications independently.    Interventions Encouraged to attend Cardiac Rehabilitation for the exercise    Continue Psychosocial Services  Follow up required by staff          Vocational Rehabilitation: Provide vocational rehab assistance to qualifying candidates.   Vocational Rehab Evaluation & Intervention:  Vocational Rehab - 12/17/23 1441       Initial Vocational Rehab Evaluation & Intervention   Assessment shows need for Vocational Rehabilitation No      Vocational Rehab Re-Evaulation   Comments no request for VR at this time          Education: Education Goals: Education classes will be provided on a variety of topics geared toward better understanding of heart health and risk factor modification. Participant will state understanding/return demonstration of topics presented as noted by education test scores.  Learning Barriers/Preferences:  Learning Barriers/Preferences - 12/17/23 1441       Learning Barriers/Preferences   Learning Barriers None    Learning Preferences None          General Cardiac Education Topics:  AED/CPR: - Group verbal and written instruction with the use of models to demonstrate the basic use of the AED with the basic ABC's of resuscitation.   Anatomy and Cardiac Procedures: - Group verbal and visual presentation and models provide information about basic cardiac anatomy and function. Reviews the testing methods done to diagnose heart disease and the outcomes of the test results. Describes the treatment choices: Medical Management, Angioplasty, or Coronary Bypass Surgery for treating  various heart conditions including Myocardial Infarction, Angina, Valve Disease, and Cardiac Arrhythmias.  Written material given at graduation. Flowsheet Row Cardiac Rehab from 12/21/2023 in [facility] Cardiac and Pulmonary Rehab  Education need identified 12/21/23    Medication Safety: - Group verbal and visual instruction to review commonly prescribed medications for heart and lung disease. Reviews the medication, class of the drug, and side effects. Includes the steps to properly store meds and maintain the prescription regimen.  Written material given at graduation.   Intimacy: - Group verbal instruction through game format to discuss how heart and lung disease can affect sexual intimacy. Written material given at graduation..   Know Your Numbers and Heart Failure: - Group verbal and visual instruction to discuss disease risk factors for cardiac and pulmonary disease and treatment options.  Reviews associated critical values for Overweight/Obesity, Hypertension, Cholesterol, and Diabetes.  Discusses basics of heart failure: signs/symptoms and treatments.  Introduces Heart Failure Zone chart for action plan for heart failure.  Written material given at graduation.   Infection Prevention: - Provides verbal and written material to individual with discussion of infection control including proper hand washing and proper equipment cleaning during exercise session. Flowsheet Row Cardiac Rehab from 12/21/2023 in [facility] Cardiac and Pulmonary Rehab  Date 12/21/23  Educator MB  Instruction Review Code 1- Verbalizes Understanding    Falls Prevention: - Provides verbal and written material to individual with discussion of falls prevention and safety. Flowsheet Row Cardiac Rehab from 12/21/2023 in [facility] Cardiac and Pulmonary Rehab  Date 12/21/23  Educator MB  Instruction Review Code 1- Verbalizes Understanding    Other: -Provides group and verbal instruction on various topics (see  comments)   Knowledge Questionnaire Score:  Knowledge Questionnaire Score - 12/21/23 1201       Knowledge Questionnaire Score   Pre Score 25/26          Core Components/Risk Factors/Patient Goals at Admission:  Personal Goals and Risk Factors at Admission - 12/21/23 1201       Core Components/Risk Factors/Patient Goals on Admission    Weight Management Yes;Weight Maintenance    Intervention Weight Management: Develop a combined nutrition and exercise program designed to reach desired caloric intake, while maintaining appropriate intake of nutrient and fiber, sodium and fats, and appropriate energy expenditure required for the weight goal.;Weight Management: Provide education and appropriate resources to help participant work on and attain dietary goals.;Weight Management/Obesity: Establish reasonable short term and long term weight goals.    Admit Weight 182 lb 4.8 oz (82.7 kg)    Goal Weight: Short Term 182 lb 4.8 oz (82.7 kg)    Goal Weight: Long Term 182 lb 4.8 oz (82.7 kg)    Expected Outcomes Short Term: Continue to assess and modify interventions until short term weight is achieved;Long Term: Adherence to nutrition and physical activity/exercise program aimed toward attainment of established weight goal;Weight Maintenance: Understanding of the daily nutrition guidelines, which includes 25-35% calories from fat, 7% or less cal from saturated fats, less than 200mg  cholesterol, less than 1.5gm of sodium, & 5 or more servings of fruits and vegetables daily;Understanding recommendations for meals to include 15-35% energy as protein, 25-35% energy from fat, 35-60% energy from carbohydrates, less than 200mg  of dietary cholesterol, 20-35 gm of total fiber daily;Understanding of distribution of calorie intake throughout the day with the consumption of 4-5 meals/snacks    Hypertension Yes    Intervention Provide education on lifestyle modifcations including regular physical activity/exercise,  weight management, moderate sodium restriction  and increased consumption of fresh fruit, vegetables, and low fat dairy, alcohol moderation, and smoking cessation.;Monitor prescription use compliance.    Expected Outcomes Short Term: Continued assessment and intervention until BP is < 140/88mm HG in hypertensive participants. < 130/24mm HG in hypertensive participants with diabetes, heart failure or chronic kidney disease.;Long Term: Maintenance of blood pressure at goal levels.    Lipids Yes    Intervention Provide education and support for participant on nutrition & aerobic/resistive exercise along with prescribed medications to achieve LDL 70mg , HDL >40mg .    Expected Outcomes Short Term: Participant states understanding of desired cholesterol values and is compliant with medications prescribed. Participant is following exercise prescription and nutrition guidelines.;Long Term: Cholesterol controlled with medications as prescribed, with individualized exercise RX and with personalized nutrition plan. Value goals: LDL < 70mg , HDL > 40 mg.          Education:Diabetes - Individual verbal and written instruction to review signs/symptoms of diabetes, desired ranges of glucose level fasting, after meals and with exercise. Acknowledge that pre and post exercise glucose checks will be done for 3 sessions at entry of program.   Core Components/Risk Factors/Patient Goals Review:   Goals and Risk Factor Review     Row Name 01/11/24 1729             Core Components/Risk Factors/Patient Goals Review   Personal Goals Review Hypertension;Weight Management/Obesity;Other       Review Chris Shelton has been doing well in rehab. He checks his blood pressure at home and is maintaining his weight. He has been having issues with his sides/lats tensing up alot. It is a sharp pain that he feels when he exercsies.       Expected Outcomes Short: ask his doctor about his side pain. Long: continue to improve side pain.           Core Components/Risk Factors/Patient Goals at Discharge (Final Review):   Goals and Risk Factor Review - 01/11/24 1729       Core Components/Risk Factors/Patient Goals Review   Personal Goals Review Hypertension;Weight Management/Obesity;Other    Review Chris Shelton has been doing well in rehab. He checks his blood pressure at home and is maintaining his weight. He has been having issues with his sides/lats tensing up alot. It is a sharp pain that he feels when he exercsies.    Expected Outcomes Short: ask his doctor about his side pain. Long: continue to improve side pain.          ITP Comments:  ITP Comments     Row Name 12/17/23 1520 12/21/23 1152 12/23/23 1201 12/23/23 1727 01/20/24 0847   ITP Comments Virtual orientation call completed today. he has an appointment on  0 12/21/2023 for EP eval and gym Orientation.  Documentation of diagnosis can be found in [facility] 11/20/[facility]23 . Completed and gym orientation for cardiac rehab. Initial ITP created and sent for review to Dr. Oneil Pinal, Medical Director. 30 Day review completed. Medical Director ITP review done, changes made as directed, and signed approval by Medical Director. New Patient First full day of exercise!  Patient was oriented to gym and equipment including functions, settings, policies, and procedures.  Patient's individual exercise prescription and treatment plan were reviewed.  All starting workloads were established based on the results of the 6 minute walk test done at initial orientation visit.  The plan for exercise progression was also introduced and progression will be customized based on patient's performance and goals. 30 Day review completed.  Medical Director ITP review done, changes made as directed, and signed approval by Medical Director. New to program.      Comments: 30 day review

## 2024-01-21 ENCOUNTER — Encounter: Admitting: *Deleted

## 2024-01-21 DIAGNOSIS — Z951 Presence of aortocoronary bypass graft: Secondary | ICD-10-CM

## 2024-01-21 DIAGNOSIS — Z48812 Encounter for surgical aftercare following surgery on the circulatory system: Secondary | ICD-10-CM | POA: Diagnosis not present

## 2024-01-21 DIAGNOSIS — I214 Non-ST elevation (NSTEMI) myocardial infarction: Secondary | ICD-10-CM

## 2024-01-21 NOTE — Progress Notes (Signed)
 Daily Session Note  Patient Details  Name: Chris Shelton MRN: [id number] Date of Birth: [birth year] Referring Provider:   Flowsheet Row Cardiac Rehab from 12/21/2023 in [facility] Cardiac and Pulmonary Rehab  Referring Provider Florencio Kava, MD    Encounter Date: 01/21/2024  Check In:  Session Check In - 01/21/24 1725       Check-In   Supervising physician immediately available to respond to emergencies See telemetry face sheet for immediately available ER MD    Location [facility]-Cardiac & Pulmonary Rehab    Staff Present Hoy Rodney RN,BSN;Joseph [facility] RCP,RRT,BSRT;Margaret Best, MS, Exercise Physiologist;Maxon Conetta BS, Exercise Physiologist    Virtual Visit No    Medication changes reported     No    Fall or balance concerns reported    No    Warm-up and Cool-down Performed on first and last piece of equipment    Resistance Training Performed Yes    VAD Patient? No    PAD/SET Patient? No      Pain Assessment   Currently in Pain? No/denies             Social History   Tobacco Use  Smoking Status Never  Smokeless Tobacco Never    Goals Met:  Independence with exercise equipment Exercise tolerated well No report of concerns or symptoms today Strength training completed today  Goals Unmet:  Not Applicable  Comments: Pt able to follow exercise prescription today without complaint.  Will continue to monitor for progression.    Dr. Oneil Pinal is Medical Director for [facility] Cardiac Rehabilitation.  Dr. Fuad Aleskerov is Medical Director for [facility] Pulmonary Rehabilitation.

## 2024-01-25 ENCOUNTER — Encounter: Attending: Internal Medicine | Admitting: *Deleted

## 2024-01-25 DIAGNOSIS — Z951 Presence of aortocoronary bypass graft: Secondary | ICD-10-CM | POA: Insufficient documentation

## 2024-01-25 DIAGNOSIS — I214 Non-ST elevation (NSTEMI) myocardial infarction: Secondary | ICD-10-CM | POA: Insufficient documentation

## 2024-01-25 NOTE — Progress Notes (Signed)
 Daily Session Note  Patient Details  Name: Chris Shelton MRN: [id number] Date of Birth: 1960-11-22 Referring Provider:   Flowsheet Row Cardiac Rehab from 12/21/2023 in [facility] Cardiac and Pulmonary Rehab  Referring Provider Florencio Kava, MD    Encounter Date: 01/25/2024  Check In:  Session Check In - 01/25/24 1724       Check-In   Supervising physician immediately available to respond to emergencies See telemetry face sheet for immediately available ER MD    Location [facility]-Cardiac & Pulmonary Rehab    Staff Present Hoy Rodney RN,BSN;Kelly Dyane BS, ACSM CEP, Exercise Physiologist;Joseph Rolinda RCP,RRT,BSRT    Virtual Visit No    Medication changes reported     No    Fall or balance concerns reported    No    Warm-up and Cool-down Performed on first and last piece of equipment    Resistance Training Performed Yes    VAD Patient? No      Pain Assessment   Currently in Pain? No/denies             Social History   Tobacco Use  Smoking Status Never  Smokeless Tobacco Never    Goals Met:  Independence with exercise equipment Exercise tolerated well No report of concerns or symptoms today Strength training completed today  Goals Unmet:  Not Applicable  Comments: Pt able to follow exercise prescription today without complaint.  Will continue to monitor for progression.   Reviewed home exercise with pt today.  Pt plans to walk, use his total gym equipment, and do yoga for exercise.  Reviewed THR, pulse, RPE, sign and symptoms, pulse oximetery and when to call 911 or MD.  Also discussed weather considerations and indoor options.  Pt voiced understanding.   Dr. Oneil Pinal is Medical Director for [facility] Cardiac Rehabilitation.  Dr. Fuad Aleskerov is Medical Director for [facility] Pulmonary Rehabilitation.

## 2024-01-27 ENCOUNTER — Encounter

## 2024-01-27 DIAGNOSIS — Z951 Presence of aortocoronary bypass graft: Secondary | ICD-10-CM

## 2024-01-27 DIAGNOSIS — I214 Non-ST elevation (NSTEMI) myocardial infarction: Secondary | ICD-10-CM | POA: Diagnosis not present

## 2024-01-27 NOTE — Progress Notes (Signed)
 Daily Session Note  Patient Details  Name: Chris Shelton MRN: [id number] Date of Birth: 01-19-1961 Referring Provider:   Flowsheet Row Cardiac Rehab from 12/21/2023 in [facility] Cardiac and Pulmonary Rehab  Referring Provider Florencio Kava, MD    Encounter Date: 01/27/2024  Check In:  Session Check In - 01/27/24 1705       Check-In   Supervising physician immediately available to respond to emergencies See telemetry face sheet for immediately available ER MD    Location [facility]-Cardiac & Pulmonary Rehab    Staff Present Burnard Davenport [facility] Dyane BS, ACSM CEP, Exercise Physiologist;Joseph Rolinda RCP,RRT,BSRT    Virtual Visit No    Medication changes reported     No    Fall or balance concerns reported    No    Warm-up and Cool-down Performed on first and last piece of equipment    Resistance Training Performed Yes    VAD Patient? No    PAD/SET Patient? No      Pain Assessment   Currently in Pain? No/denies             Social History   Tobacco Use  Smoking Status Never  Smokeless Tobacco Never    Goals Met:  Independence with exercise equipment Exercise tolerated well Personal goals reviewed No report of concerns or symptoms today Strength training completed today  Goals Unmet:  Not Applicable  Comments: Pt able to follow exercise prescription today without complaint.  Will continue to monitor for progression.    Dr. Oneil Pinal is Medical Director for [facility] Cardiac Rehabilitation.  Dr. Fuad Aleskerov is Medical Director for [facility] Pulmonary Rehabilitation.

## 2024-01-28 ENCOUNTER — Encounter: Admitting: *Deleted

## 2024-01-28 DIAGNOSIS — Z951 Presence of aortocoronary bypass graft: Secondary | ICD-10-CM

## 2024-01-28 DIAGNOSIS — I214 Non-ST elevation (NSTEMI) myocardial infarction: Secondary | ICD-10-CM | POA: Diagnosis not present

## 2024-01-28 NOTE — Progress Notes (Signed)
 Daily Session Note  Patient Details  Name: ERMINIO NYGARD MRN: [id number] Date of Birth: 28-Apr-1961 Referring Provider:   Flowsheet Row Cardiac Rehab from 12/21/2023 in [facility] Cardiac and Pulmonary Rehab  Referring Provider Florencio Kava, MD    Encounter Date: 01/28/2024  Check In:  Session Check In - 01/28/24 1723       Check-In   Supervising physician immediately available to respond to emergencies See telemetry face sheet for immediately available ER MD    Location [facility]-Cardiac & Pulmonary Rehab    Staff Present Hoy Rodney RN,BSN;Joseph [facility] BS, Exercise Physiologist    Virtual Visit No    Medication changes reported     No    Fall or balance concerns reported    No    Warm-up and Cool-down Performed on first and last piece of equipment    Resistance Training Performed Yes    VAD Patient? No    PAD/SET Patient? No      Pain Assessment   Currently in Pain? No/denies             Social History   Tobacco Use  Smoking Status Never  Smokeless Tobacco Never    Goals Met:  Independence with exercise equipment Exercise tolerated well No report of concerns or symptoms today Strength training completed today  Goals Unmet:  Not Applicable  Comments: Pt able to follow exercise prescription today without complaint.  Will continue to monitor for progression.    Dr. Oneil Pinal is Medical Director for [facility] Cardiac Rehabilitation.  Dr. Fuad Aleskerov is Medical Director for [facility] Pulmonary Rehabilitation.

## 2024-02-01 ENCOUNTER — Encounter: Admitting: *Deleted

## 2024-02-01 DIAGNOSIS — Z951 Presence of aortocoronary bypass graft: Secondary | ICD-10-CM

## 2024-02-01 DIAGNOSIS — I214 Non-ST elevation (NSTEMI) myocardial infarction: Secondary | ICD-10-CM | POA: Diagnosis not present

## 2024-02-01 NOTE — Progress Notes (Signed)
 Daily Session Note  Patient Details  Name: Chris Shelton MRN: [id number] Date of Birth: 05-16-1961 Referring Provider:   Flowsheet Row Cardiac Rehab from 12/21/2023 in [facility] Cardiac and Pulmonary Rehab  Referring Provider Florencio Kava, MD    Encounter Date: 02/01/2024  Check In:  Session Check In - 02/01/24 1720       Check-In   Supervising physician immediately available to respond to emergencies See telemetry face sheet for immediately available ER MD    Location [facility]-Cardiac & Pulmonary Rehab    Staff Present Hoy Rodney RN,BSN;Joseph [facility] Dyane BS, ACSM CEP, Exercise Physiologist    Virtual Visit No    Medication changes reported     No    Fall or balance concerns reported    No    Warm-up and Cool-down Performed on first and last piece of equipment    Resistance Training Performed Yes    VAD Patient? No    PAD/SET Patient? No      Pain Assessment   Currently in Pain? No/denies             Social History   Tobacco Use  Smoking Status Never  Smokeless Tobacco Never    Goals Met:  Independence with exercise equipment Exercise tolerated well No report of concerns or symptoms today Strength training completed today  Goals Unmet:  Not Applicable  Comments: Pt able to follow exercise prescription today without complaint.  Will continue to monitor for progression.    Dr. Oneil Pinal is Medical Director for [facility] Cardiac Rehabilitation.  Dr. Fuad Aleskerov is Medical Director for [facility] Pulmonary Rehabilitation.

## 2024-02-03 ENCOUNTER — Encounter: Admitting: Emergency Medicine

## 2024-02-03 DIAGNOSIS — I214 Non-ST elevation (NSTEMI) myocardial infarction: Secondary | ICD-10-CM | POA: Diagnosis not present

## 2024-02-03 DIAGNOSIS — Z951 Presence of aortocoronary bypass graft: Secondary | ICD-10-CM

## 2024-02-03 NOTE — Progress Notes (Signed)
 Daily Session Note  Patient Details  Name: BRANDEN SHALLENBERGER MRN: [id number] Date of Birth: 09-26-1960 Referring Provider:   Flowsheet Row Cardiac Rehab from 12/21/2023 in [facility] Cardiac and Pulmonary Rehab  Referring Provider Florencio Kava, MD    Encounter Date: 02/03/2024  Check In:  Session Check In - 02/03/24 1736       Check-In   Supervising physician immediately available to respond to emergencies See telemetry face sheet for immediately available ER MD    Location [facility]-Cardiac & Pulmonary Rehab    Staff Present Fairy Plater RCP,RRT,BSRT;Kelly Dyane BS, ACSM CEP, Exercise Physiologist;Kelly [facility]    Virtual Visit No    Medication changes reported     No    Fall or balance concerns reported    No    Warm-up and Cool-down Performed on first and last piece of equipment    Resistance Training Performed Yes    VAD Patient? No    PAD/SET Patient? No      Pain Assessment   Currently in Pain? No/denies             Social History   Tobacco Use  Smoking Status Never  Smokeless Tobacco Never    Goals Met:  Independence with exercise equipment Exercise tolerated well No report of concerns or symptoms today Strength training completed today  Goals Unmet:  Not Applicable  Comments: Pt able to follow exercise prescription today without complaint.  Will continue to monitor for progression.    Dr. Oneil Pinal is Medical Director for [facility] Cardiac Rehabilitation.  Dr. Fuad Aleskerov is Medical Director for [facility] Pulmonary Rehabilitation.

## 2024-02-04 ENCOUNTER — Encounter: Admitting: Emergency Medicine

## 2024-02-04 DIAGNOSIS — I214 Non-ST elevation (NSTEMI) myocardial infarction: Secondary | ICD-10-CM | POA: Diagnosis not present

## 2024-02-04 DIAGNOSIS — Z951 Presence of aortocoronary bypass graft: Secondary | ICD-10-CM

## 2024-02-04 NOTE — Progress Notes (Signed)
 Daily Session Note  Patient Details  Name: Chris Shelton MRN: [id number] Date of Birth: [birth year] Referring Provider:   Flowsheet Row Cardiac Rehab from 12/21/2023 in [facility] Cardiac and Pulmonary Rehab  Referring Provider Florencio Kava, MD    Encounter Date: 02/04/2024  Check In:  Session Check In - 02/04/24 1715       Check-In   Supervising physician immediately available to respond to emergencies See telemetry face sheet for immediately available ER MD    Location [facility]-Cardiac & Pulmonary Rehab    Staff Present Maxon Conetta BS, Exercise Physiologist;Joseph [facility] RN,BSN;Margaret Best, MS, Exercise Physiologist    Virtual Visit No    Medication changes reported     No    Fall or balance concerns reported    No    Warm-up and Cool-down Performed on first and last piece of equipment    Resistance Training Performed Yes    VAD Patient? No    PAD/SET Patient? No      Pain Assessment   Currently in Pain? No/denies             Social History   Tobacco Use  Smoking Status Never  Smokeless Tobacco Never    Goals Met:  Independence with exercise equipment Exercise tolerated well No report of concerns or symptoms today Strength training completed today  Goals Unmet:  Not Applicable  Comments: Pt able to follow exercise prescription today without complaint.  Will continue to monitor for progression.    Dr. Oneil Pinal is Medical Director for [facility] Cardiac Rehabilitation.  Dr. Fuad Aleskerov is Medical Director for [facility] Pulmonary Rehabilitation.

## 2024-02-08 ENCOUNTER — Encounter: Admitting: Emergency Medicine

## 2024-02-08 DIAGNOSIS — I214 Non-ST elevation (NSTEMI) myocardial infarction: Secondary | ICD-10-CM

## 2024-02-08 DIAGNOSIS — Z951 Presence of aortocoronary bypass graft: Secondary | ICD-10-CM

## 2024-02-08 NOTE — Progress Notes (Signed)
 Daily Session Note  Patient Details  Name: Chris Shelton MRN: [id number] Date of Birth: Oct 31, 1960 Referring Provider:   Flowsheet Row Cardiac Rehab from 12/21/2023 in [facility] Cardiac and Pulmonary Rehab  Referring Provider Florencio Kava, MD    Encounter Date: 02/08/2024  Check In:  Session Check In - 02/08/24 1722       Check-In   Supervising physician immediately available to respond to emergencies See telemetry face sheet for immediately available ER MD    Location [facility]-Cardiac & Pulmonary Rehab    Staff Present Othel Durand, RN, BSN, CCRP;Joseph Hood RCP,RRT,BSRT;Lauren Imaya Duffy RN,BSN;Kelly Cecilton BS, ACSM CEP, Exercise Physiologist    Virtual Visit No    Medication changes reported     No    Fall or balance concerns reported    No    Warm-up and Cool-down Performed on first and last piece of equipment    Resistance Training Performed Yes    VAD Patient? No    PAD/SET Patient? No      Pain Assessment   Currently in Pain? No/denies             Social History   Tobacco Use  Smoking Status Never  Smokeless Tobacco Never    Goals Met:  Independence with exercise equipment Exercise tolerated well No report of concerns or symptoms today Strength training completed today  Goals Unmet:  Not Applicable  Comments: Pt able to follow exercise prescription today without complaint.  Will continue to monitor for progression.    Dr. Oneil Pinal is Medical Director for [facility] Cardiac Rehabilitation.  Dr. Fuad Aleskerov is Medical Director for [facility] Pulmonary Rehabilitation.

## 2024-02-10 ENCOUNTER — Encounter

## 2024-02-10 DIAGNOSIS — I214 Non-ST elevation (NSTEMI) myocardial infarction: Secondary | ICD-10-CM | POA: Diagnosis not present

## 2024-02-10 DIAGNOSIS — Z951 Presence of aortocoronary bypass graft: Secondary | ICD-10-CM

## 2024-02-10 NOTE — Progress Notes (Signed)
 Daily Session Note  Patient Details  Name: Chris Shelton MRN: [id number] Date of Birth: 1961/06/21 Referring Provider:   Flowsheet Row Cardiac Rehab from 12/21/2023 in [facility] Cardiac and Pulmonary Rehab  Referring Provider Florencio Kava, MD    Encounter Date: 02/10/2024  Check In:  Session Check In - 02/10/24 1714       Check-In   Supervising physician immediately available to respond to emergencies See telemetry face sheet for immediately available ER MD    Location [facility]-Cardiac & Pulmonary Rehab    Staff Present Burnard Davenport RN,BSN,MPA;Joseph [facility] RCP,RRT,BSRT;Brayden Betters Dyane BS, ACSM CEP, Exercise Physiologist    Virtual Visit No    Medication changes reported     No    Fall or balance concerns reported    No    Warm-up and Cool-down Performed on first and last piece of equipment    Resistance Training Performed Yes    VAD Patient? No    PAD/SET Patient? No      Pain Assessment   Currently in Pain? No/denies             Social History   Tobacco Use  Smoking Status Never  Smokeless Tobacco Never    Goals Met:  Independence with exercise equipment Exercise tolerated well No report of concerns or symptoms today Strength training completed today  Goals Unmet:  Not Applicable  Comments: Pt able to follow exercise prescription today without complaint.  Will continue to monitor for progression.    Dr. Oneil Pinal is Medical Director for [facility] Cardiac Rehabilitation.  Dr. Fuad Aleskerov is Medical Director for [facility] Pulmonary Rehabilitation.

## 2024-02-11 ENCOUNTER — Encounter: Admitting: Emergency Medicine

## 2024-02-11 DIAGNOSIS — Z951 Presence of aortocoronary bypass graft: Secondary | ICD-10-CM

## 2024-02-11 DIAGNOSIS — I214 Non-ST elevation (NSTEMI) myocardial infarction: Secondary | ICD-10-CM

## 2024-02-11 NOTE — Progress Notes (Signed)
 Daily Session Note  Patient Details  Name: Chris Shelton MRN: [id number] Date of Birth: March 12, 1961 Referring Provider:   Flowsheet Row Cardiac Rehab from 12/21/2023 in [facility] Cardiac and Pulmonary Rehab  Referring Provider Florencio Kava, MD    Encounter Date: 02/11/2024  Check In:  Session Check In - 02/11/24 1728       Check-In   Supervising physician immediately available to respond to emergencies See telemetry face sheet for immediately available ER MD    Location [facility]-Cardiac & Pulmonary Rehab    Staff Present Maxon Conetta BS, Exercise Physiologist;Joseph Rolinda RCP,RRT,BSRT;Greg Eckrich RN,BSN;Meredith Tressa RN,BSN    Virtual Visit No    Medication changes reported     No    Fall or balance concerns reported    No    Warm-up and Cool-down Performed on first and last piece of equipment    Resistance Training Performed Yes    VAD Patient? No    PAD/SET Patient? No      Pain Assessment   Currently in Pain? No/denies             Social History   Tobacco Use  Smoking Status Never  Smokeless Tobacco Never    Goals Met:  Independence with exercise equipment Exercise tolerated well No report of concerns or symptoms today Strength training completed today  Goals Unmet:  Not Applicable  Comments: Pt able to follow exercise prescription today without complaint.  Will continue to monitor for progression.    Dr. Oneil Pinal is Medical Director for [facility] Cardiac Rehabilitation.  Dr. Fuad Aleskerov is Medical Director for [facility] Pulmonary Rehabilitation.

## 2024-02-15 ENCOUNTER — Encounter: Admitting: Emergency Medicine

## 2024-02-15 DIAGNOSIS — I214 Non-ST elevation (NSTEMI) myocardial infarction: Secondary | ICD-10-CM | POA: Diagnosis not present

## 2024-02-15 DIAGNOSIS — Z951 Presence of aortocoronary bypass graft: Secondary | ICD-10-CM

## 2024-02-15 NOTE — Progress Notes (Signed)
 Daily Session Note  Patient Details  Name: STOCKTON NUNLEY MRN: [id number] Date of Birth: 1961-03-19 Referring Provider:   Flowsheet Row Cardiac Rehab from 12/21/2023 in [facility] Cardiac and Pulmonary Rehab  Referring Provider Florencio Kava, MD    Encounter Date: 02/15/2024  Check In:  Session Check In - 02/15/24 1707       Check-In   Supervising physician immediately available to respond to emergencies See telemetry face sheet for immediately available ER MD    Location [facility]-Cardiac & Pulmonary Rehab    Staff Present Burnard Hint BS, ACSM CEP, Exercise Physiologist;Joseph Rolinda RCP,RRT,BSRT;Nykiah Ma RN,BSN    Virtual Visit No    Medication changes reported     No    Fall or balance concerns reported    No    Warm-up and Cool-down Performed on first and last piece of equipment    Resistance Training Performed Yes    VAD Patient? No    PAD/SET Patient? No      Pain Assessment   Currently in Pain? No/denies             Social History   Tobacco Use  Smoking Status Never  Smokeless Tobacco Never    Goals Met:  Independence with exercise equipment Exercise tolerated well No report of concerns or symptoms today Strength training completed today  Goals Unmet:  Not Applicable.  Comments: Pt able to follow exercise prescription today without complaint.  Will continue to monitor for progression.    Dr. Oneil Pinal is Medical Director for [facility] Cardiac Rehabilitation.  Dr. Fuad Aleskerov is Medical Director for [facility] Pulmonary Rehabilitation.

## 2024-02-17 ENCOUNTER — Encounter: Admitting: Emergency Medicine

## 2024-02-17 DIAGNOSIS — Z951 Presence of aortocoronary bypass graft: Secondary | ICD-10-CM

## 2024-02-17 DIAGNOSIS — I214 Non-ST elevation (NSTEMI) myocardial infarction: Secondary | ICD-10-CM | POA: Diagnosis not present

## 2024-02-17 NOTE — Progress Notes (Signed)
 Cardiac Individual Treatment Plan  Patient Details  Name: JOSEF TOURIGNY MRN: [id number] Date of Birth: [birth year] Referring Provider:   Flowsheet Row Cardiac Rehab from 12/21/2023 in [facility] Cardiac and Pulmonary Rehab  Referring Provider Florencio Kava, MD    Initial Encounter Date:  Flowsheet Row Cardiac Rehab from 12/21/2023 in [facility] Cardiac and Pulmonary Rehab  Date 12/21/23    Visit Diagnosis: NSTEMI (non-ST elevation myocardial infarction) (HCC)  S/P CABG x 3  Patient's Home Medications on Admission:  Current Outpatient Medications:    aspirin  EC 81 MG tablet, Take 1 tablet (81 mg total) by mouth daily. Swallow whole., Disp: 30 tablet, Rfl: 12   atorvastatin  (LIPITOR ) 80 MG tablet, Take 80 mg by mouth at bedtime., Disp: , Rfl:    brimonidine  (ALPHAGAN ) 0.2 % ophthalmic solution, Place 1 drop into both eyes 2 (two) times daily., Disp: , Rfl:    cholecalciferol (VITAMIN D3) 25 MCG (1000 UNIT) tablet, Take 1,000 Units by mouth daily as needed., Disp: , Rfl:    Coenzyme Q10 (CO Q 10 PO), Take 1 capsule by mouth daily as needed., Disp: , Rfl:    furosemide (LASIX) 40 MG tablet, Take 40 mg by mouth daily., Disp: , Rfl:    isosorbide  mononitrate (IMDUR ) 30 MG 24 hr tablet, Take 1 tablet (30 mg total) by mouth daily. (Patient not taking: Reported on 12/17/2023), Disp: 30 tablet, Rfl: 1   metoprolol  tartrate (LOPRESSOR ) 25 MG tablet, Take 12.5 mg by mouth daily., Disp: , Rfl:    Multiple Vitamins-Minerals (MULTIVITAMIN ADULT) CHEW, Chew 1 tablet by mouth daily as needed., Disp: , Rfl:    nitroGLYCERIN  (NITROSTAT ) 0.4 MG SL tablet, Place 1 tablet (0.4 mg total) under the tongue every 5 (five) minutes as needed for chest pain., Disp: 30 tablet, Rfl: 1   Omega-3 Fatty Acids (FISH OIL PO), Take 1 capsule by mouth daily as needed., Disp: , Rfl:    omeprazole  (PRILOSEC ) 20 MG capsule, Take 20 mg by mouth daily., Disp: , Rfl:    potassium chloride SA (KLOR-CON M) 20 MEQ tablet, Take 20 mEq by mouth  daily., Disp: , Rfl:   Past Medical History: Past Medical History:  Diagnosis Date   Cataracts, bilateral 2019   Detached retina    Palpitations    Sinus trouble 2019   Stroke [facility])    TIA (transient ischemic attack)     Tobacco Use: Social History   Tobacco Use  Smoking Status Never  Smokeless Tobacco Never    Labs: Review Flowsheet       Latest Ref Rng & Units 07/10/2021 11/04/2021 12/19/2023  Labs for ITP Cardiac and Pulmonary Rehab  Cholestrol 0 - 200 mg/dL 778  784  94   LDL (calc) 0 - 99 mg/dL 849  856  34   HDL-C >59 mg/dL 39  43  31   Trlycerides <150 mg/dL 840  853  852   Hemoglobin A1c 4.8 - 5.6 % 5.6  - -     Exercise Target Goals: Exercise Program Goal: Individual exercise prescription set using results from initial 6 min walk test and THRR while considering  patient's activity barriers and safety.   Exercise Prescription Goal: Initial exercise prescription builds to 30-45 minutes a day of aerobic activity, 2-3 days per week.  Home exercise guidelines will be given to patient during program as part of exercise prescription that the participant will acknowledge.   Education: Aerobic Exercise: - Group verbal and visual presentation on the components of  exercise prescription. Introduces F.I.T.T principle from ACSM for exercise prescriptions.  Reviews F.I.T.T. principles of aerobic exercise including progression. Written material provided at class time.   Education: Resistance Exercise: - Group verbal and visual presentation on the components of exercise prescription. Introduces F.I.T.T principle from ACSM for exercise prescriptions  Reviews F.I.T.T. principles of resistance exercise including progression. Written material provided at class time.    Education: Exercise & Equipment Safety: - Individual verbal instruction and demonstration of equipment use and safety with use of the equipment. Flowsheet Row Cardiac Rehab from 02/10/2024 in [facility] Cardiac and  Pulmonary Rehab  Date 12/21/23  Educator MB  Instruction Review Code 1- Verbalizes Understanding    Education: Exercise Physiology & General Exercise Guidelines: - Group verbal and written instruction with models to review the exercise physiology of the cardiovascular system and associated critical values. Provides general exercise guidelines with specific guidelines to those with heart or lung disease. Written material provided at class time.   Education: Flexibility, Balance, Mind/Body Relaxation: - Group verbal and visual presentation with interactive activity on the components of exercise prescription. Introduces F.I.T.T principle from ACSM for exercise prescriptions. Reviews F.I.T.T. principles of flexibility and balance exercise training including progression. Also discusses the mind body connection.  Reviews various relaxation techniques to help reduce and manage stress (i.e. Deep breathing, progressive muscle relaxation, and visualization). Balance handout provided to take home. Written material provided at class time.   Activity Barriers & Risk Stratification:  Activity Barriers & Cardiac Risk Stratification - 12/21/23 1153       Activity Barriers & Cardiac Risk Stratification   Activity Barriers None    Cardiac Risk Stratification High          6 Minute Walk:  6 Minute Walk     Row Name 12/21/23 1152         6 Minute Walk   Phase Initial     Distance 1490 feet     Walk Time 6 minutes     # of Rest Breaks 0     MPH 2.82     METS 3.83     RPE 11     Perceived Dyspnea  0     VO2 Peak 13.4     Symptoms No     Resting HR 54 bpm     Resting BP 126/70     Resting Oxygen Saturation  99 %     Exercise Oxygen Saturation  during 6 min walk 99 %     Max Ex. HR 94 bpm     Max Ex. BP 156/82     2 Minute Post BP 142/80        Oxygen Initial Assessment:   Oxygen Re-Evaluation:   Oxygen Discharge (Final Oxygen Re-Evaluation):   Initial Exercise Prescription:   Initial Exercise Prescription - 12/21/23 1100       Date of Initial Exercise RX and Referring Provider   Date 12/21/23    Referring Provider Florencio Kava, MD      Oxygen   Maintain Oxygen Saturation 88% or higher      Treadmill   MPH 2.8    Grade 0    Minutes 15    METs 3.14      NuStep   Level 3   T6   SPM 80    Minutes 15    METs 3.83      Elliptical   Level 1    Speed 3    Minutes 15  METs 3.83      REL-XR   Level 3    Speed 50    Minutes 15    METs 3.83      Rower   Level 3    Watts 25    Minutes 15    METs 3.83      Prescription Details   Frequency (times per week) 3    Duration Progress to 30 minutes of continuous aerobic without signs/symptoms of physical distress      Intensity   THRR 40-80% of Max Heartrate 95-136    Ratings of Perceived Exertion 11-13    Perceived Dyspnea 0-4      Progression   Progression Continue to progress workloads to maintain intensity without signs/symptoms of physical distress.      Resistance Training   Training Prescription Yes    Weight 7 lb    Reps 10-15          Perform Capillary Blood Glucose checks as needed.  Exercise Prescription Changes:   Exercise Prescription Changes     Row Name 12/21/23 1100 01/07/24 1100 01/19/24 1500 01/25/24 1700 02/02/24 1500     Response to Exercise   Blood Pressure (Admit) 126/70 122/60 112/54 -- 102/56   Blood Pressure (Exercise) 156/82 158/80 156/72 -- 144/70   Blood Pressure (Exit) 142/80 112/56 106/54 -- 106/60   Heart Rate (Admit) 54 bpm 58 bpm 76 bpm -- 75 bpm   Heart Rate (Exercise) 94 bpm 113 bpm 120 bpm -- 128 bpm   Heart Rate (Exit) 54 bpm 65 bpm 61 bpm -- 78 bpm   Oxygen Saturation (Admit) 99 % -- -- -- --   Oxygen Saturation (Exercise) 99 % -- -- -- --   Oxygen Saturation (Exit) 97 % -- -- -- --   Rating of Perceived Exertion (Exercise) 11 13 15 15 15    Perceived Dyspnea (Exercise) 0 -- -- -- --   Symptoms none none none none none   Comments  results 1st 2 weeks of exercise sessions -- -- --   Duration -- Continue with 30 min of aerobic exercise without signs/symptoms of physical distress. Continue with 30 min of aerobic exercise without signs/symptoms of physical distress. Continue with 30 min of aerobic exercise without signs/symptoms of physical distress. Continue with 30 min of aerobic exercise without signs/symptoms of physical distress.   Intensity THRR New THRR unchanged THRR unchanged THRR unchanged THRR unchanged     Progression   Progression -- Continue to progress workloads to maintain intensity without signs/symptoms of physical distress. Continue to progress workloads to maintain intensity without signs/symptoms of physical distress. Continue to progress workloads to maintain intensity without signs/symptoms of physical distress. Continue to progress workloads to maintain intensity without signs/symptoms of physical distress.   Average METs 3.83 3.13 4 4  4.23     Resistance Training   Training Prescription -- Yes Yes Yes Yes   Weight -- 10 lb 10 lb 10 lb 10 lb   Reps -- 10-15 10-15 10-15 10-15     Interval Training   Interval Training -- No No No No     Treadmill   MPH -- 3 2.8 2.8 3   Grade -- 0 2 2 2    Minutes -- 15 15 15 15    METs -- 3.3 3.91 3.91 4.12     NuStep   Level -- 5  T6 5  T6 5  T6 --   Minutes -- 15 15 15  --   METs --  3 4 4  --     Elliptical   Level -- 1 3 3 4    Speed -- 1 4 4 1    Minutes -- 15 15 15 15    METs -- 4.4 5.5 5.5 6.3     REL-XR   Level -- 6 6 6 4    Minutes -- 15 15 15 15    METs -- 3.4 4.4 4.4 4.7     Home Exercise Plan   Plans to continue exercise at -- -- -- Home (comment)  walk, total gym equipment, and yoga Home (comment)  walk, total gym equipment, and yoga   Frequency -- -- -- Add 2 additional days to program exercise sessions. Add 2 additional days to program exercise sessions.   Initial Home Exercises Provided -- -- -- 01/25/24 01/25/24     Oxygen   Maintain Oxygen  Saturation -- 88% or higher 88% or higher 88% or higher 88% or higher      Exercise Comments:   Exercise Comments     Row Name 12/23/23 1727           Exercise Comments First full day of exercise!  Patient was oriented to gym and equipment including functions, settings, policies, and procedures.  Patient's individual exercise prescription and treatment plan were reviewed.  All starting workloads were established based on the results of the 6 minute walk test done at initial orientation visit.  The plan for exercise progression was also introduced and progression will be customized based on patient's performance and goals.          Exercise Goals and Review:   Exercise Goals     Row Name 12/21/23 1158             Exercise Goals   Increase Physical Activity Yes       Intervention Provide advice, education, support and counseling about physical activity/exercise needs.;Develop an individualized exercise prescription for aerobic and resistive training based on initial evaluation findings, risk stratification, comorbidities and participant's personal goals.       Expected Outcomes Short Term: Attend rehab on a regular basis to increase amount of physical activity.;Long Term: Add in home exercise to make exercise part of routine and to increase amount of physical activity.;Long Term: Exercising regularly at least 3-5 days a week.       Increase Strength and Stamina Yes       Intervention Provide advice, education, support and counseling about physical activity/exercise needs.;Develop an individualized exercise prescription for aerobic and resistive training based on initial evaluation findings, risk stratification, comorbidities and participant's personal goals.       Expected Outcomes Short Term: Increase workloads from initial exercise prescription for resistance, speed, and METs.;Short Term: Perform resistance training exercises routinely during rehab and add in resistance training at  home;Long Term: Improve cardiorespiratory fitness, muscular endurance and strength as measured by increased METs and functional capacity ( )       Able to understand and use rate of perceived exertion (RPE) scale Yes       Intervention Provide education and explanation on how to use RPE scale       Expected Outcomes Short Term: Able to use RPE daily in rehab to express subjective intensity level;Long Term:  Able to use RPE to guide intensity level when exercising independently       Able to understand and use Dyspnea scale Yes       Intervention Provide education and explanation on how to use Dyspnea scale  Expected Outcomes Short Term: Able to use Dyspnea scale daily in rehab to express subjective sense of shortness of breath during exertion;Long Term: Able to use Dyspnea scale to guide intensity level when exercising independently       Knowledge and understanding of Target Heart Rate Range (THRR) Yes       Intervention Provide education and explanation of THRR including how the numbers were predicted and where they are located for reference       Expected Outcomes Short Term: Able to state/look up THRR;Short Term: Able to use daily as guideline for intensity in rehab;Long Term: Able to use THRR to govern intensity when exercising independently       Able to check pulse independently Yes       Intervention Provide education and demonstration on how to check pulse in carotid and radial arteries.;Review the importance of being able to check your own pulse for safety during independent exercise       Expected Outcomes Short Term: Able to explain why pulse checking is important during independent exercise;Long Term: Able to check pulse independently and accurately       Understanding of Exercise Prescription Yes       Intervention Provide education, explanation, and written materials on patient's individual exercise prescription       Expected Outcomes Short Term: Able to explain program  exercise prescription;Long Term: Able to explain home exercise prescription to exercise independently          Exercise Goals Re-Evaluation :  Exercise Goals Re-Evaluation     Row Name 12/23/23 1727 01/07/24 1114 01/19/24 1510 01/25/24 1734 02/02/24 1511     Exercise Goal Re-Evaluation   Exercise Goals Review Increase Physical Activity;Able to understand and use rate of perceived exertion (RPE) scale;Knowledge and understanding of Target Heart Rate Range (THRR);Understanding of Exercise Prescription;Increase Strength and Stamina;Able to understand and use Dyspnea scale;Able to check pulse independently Increase Physical Activity;Understanding of Exercise Prescription;Increase Strength and Stamina Increase Physical Activity;Understanding of Exercise Prescription;Increase Strength and Stamina Increase Physical Activity;Able to understand and use rate of perceived exertion (RPE) scale;Knowledge and understanding of Target Heart Rate Range (THRR);Understanding of Exercise Prescription;Increase Strength and Stamina;Able to understand and use Dyspnea scale;Able to check pulse independently Increase Physical Activity;Understanding of Exercise Prescription;Increase Strength and Stamina   Comments Reviewed RPE and dyspnea scale, THR and program prescription with pt today.  Pt voiced understanding and was given a copy of goals to take home. Veto is off to a good start in the program. He tolerated his exercise prescription well and increased his workloads already. He increased his workload on the treadmill to a speed of 3 mph with no incline. He increased to level 5 on the T6 nustep and level 6 on the XR. He worked at level 1 on the elliptical. He also increased his handweights to 10lbs. We will continue to monitor his progress in the program. Yacine is doing well in rehab. He increased his workload on the treadmill to a speed of 2.8 mph and incline of 2%. He increased to level 3 on the elliptical with a speed of 4  mph. He maintained level 6 on the XR and level 5 on the T6 nustep. We will continue to monitor his progress in the program. Reviewed home exercise with pt today.  Pt plans to walk, use his total gym equipment, and do yoga for exercise.  Reviewed THR, pulse, RPE, sign and symptoms, pulse oximetery and when to call 911 or MD.  Also discussed weather considerations and indoor options.  Pt voiced understanding. Suyash continues to do well in rehab. He increased his workload on the treadmill to a speed of 3 mph with a 2% incline. He increased to level 4 on the elliptical. He worked at level 4 on the XR in this review. We will continue to monitor his progress in the program.   Expected Outcomes Short: Use RPE daily to regulate intensity. Long: Follow program prescription in THR. Short: Continue to follow current exercise prescription. Long: Continue exercise to improve strength and stamina. Short: Continue to progressively increase treadmill and XR workloads. Long: Continue exercise to improve strength and stamina. Short: add 1-2 days a week of exercise on off days of cardiac rehab. Long: maintain independent exercise routine upon graduation from cardiac rehab. Short: Continue to progressively increase elliptical workload. Long: Continue exercise to improve strength and stamina.      Discharge Exercise Prescription (Final Exercise Prescription Changes):  Exercise Prescription Changes - 02/02/24 1500       Response to Exercise   Blood Pressure (Admit) 102/56    Blood Pressure (Exercise) 144/70    Blood Pressure (Exit) 106/60    Heart Rate (Admit) 75 bpm    Heart Rate (Exercise) 128 bpm    Heart Rate (Exit) 78 bpm    Rating of Perceived Exertion (Exercise) 15    Symptoms none    Duration Continue with 30 min of aerobic exercise without signs/symptoms of physical distress.    Intensity THRR unchanged      Progression   Progression Continue to progress workloads to maintain intensity without  signs/symptoms of physical distress.    Average METs 4.23      Resistance Training   Training Prescription Yes    Weight 10 lb    Reps 10-15      Interval Training   Interval Training No      Treadmill   MPH 3    Grade 2    Minutes 15    METs 4.12      Elliptical   Level 4    Speed 1    Minutes 15    METs 6.3      REL-XR   Level 4    Minutes 15    METs 4.7      Home Exercise Plan   Plans to continue exercise at Home (comment)   walk, total gym equipment, and yoga   Frequency Add 2 additional days to program exercise sessions.    Initial Home Exercises Provided 01/25/24      Oxygen   Maintain Oxygen Saturation 88% or higher          Nutrition:  Target Goals: Understanding of nutrition guidelines, daily intake of sodium 1500mg , cholesterol 200mg , calories 30% from fat and 7% or less from saturated fats, daily to have 5 or more servings of fruits and vegetables.  Education: Nutrition 1 -Group instruction provided by verbal, written material, interactive activities, discussions, models, and posters to present general guidelines for heart healthy nutrition including macronutrients, label reading, and promoting whole foods over processed counterparts. Education serves as Pensions consultant of discussion of heart healthy eating for all. Written material provided at class time.    Education: Nutrition 2 -Group instruction provided by verbal, written material, interactive activities, discussions, models, and posters to present general guidelines for heart healthy nutrition including sodium, cholesterol, and saturated fat. Providing guidance of habit forming to improve blood pressure, cholesterol, and body weight. Written material provided  at class time. Flowsheet Row Cardiac Rehab from 02/10/2024 in [facility] Cardiac and Pulmonary Rehab  Date 02/10/24  Educator jg  Instruction Review Code 1- Verbalizes Understanding      Biometrics:  Pre Biometrics - 12/21/23 1158       Pre  Biometrics   Height 5' 9.5 (1.765 m)    Weight 182 lb 4.8 oz (82.7 kg)    Waist Circumference 37.5 inches    Hip Circumference 41 inches    Waist to Hip Ratio 0.91 %    BMI (Calculated) 26.54    Single Leg Stand 30 seconds           Nutrition Therapy Plan and Nutrition Goals:  Nutrition Therapy & Goals - 12/21/23 1159       Nutrition Therapy   RD appointment deferred Yes      Personal Nutrition Goals   Nutrition Goal RD appointment deferred at this time, might schedule in future      Intervention Plan   Intervention Prescribe, educate and counsel regarding individualized specific dietary modifications aiming towards targeted core components such as weight, hypertension, lipid management, diabetes, heart failure and other comorbidities.    Expected Outcomes Short Term Goal: Understand basic principles of dietary content, such as calories, fat, sodium, cholesterol and nutrients.          Nutrition Assessments:  MEDIFICTS Score Key: >=70 Need to make dietary changes  40-70 Heart Healthy Diet <= 40 Therapeutic Level Cholesterol Diet  Flowsheet Row Cardiac Rehab from 12/21/2023 in [facility] Cardiac and Pulmonary Rehab  Picture Your Plate Total Score on Admission 83   Picture Your Plate Scores: <59 Unhealthy dietary pattern with much room for improvement. 41-50 Dietary pattern unlikely to meet recommendations for good health and room for improvement. 51-60 More healthful dietary pattern, with some room for improvement.  >60 Healthy dietary pattern, although there may be some specific behaviors that could be improved.    Nutrition Goals Re-Evaluation:  Nutrition Goals Re-Evaluation     Row Name 01/11/24 1732 01/27/24 1733           Goals   Nutrition Goal RD appointment deferred. RD appointment deferred.         Nutrition Goals Discharge (Final Nutrition Goals Re-Evaluation):  Nutrition Goals Re-Evaluation - 01/27/24 1733       Goals   Nutrition Goal RD  appointment deferred.          Psychosocial: Target Goals: Acknowledge presence or absence of significant depression and/or stress, maximize coping skills, provide positive support system. Participant is able to verbalize types and ability to use techniques and skills needed for reducing stress and depression.   Education: Stress, Anxiety, and Depression - Group verbal and visual presentation to define topics covered.  Reviews how body is impacted by stress, anxiety, and depression.  Also discusses healthy ways to reduce stress and to treat/manage anxiety and depression. Written material provided at class time.   Education: Sleep Hygiene -Provides group verbal and written instruction about how sleep can affect your health.  Define sleep hygiene, discuss sleep cycles and impact of sleep habits. Review good sleep hygiene tips.   Initial Review & Psychosocial Screening:  Initial Psych Review & Screening - 12/17/23 1440       Initial Review   Current issues with None Identified      Family Dynamics   Good Support System? Yes   wife, family     Barriers   Psychosocial barriers to participate in program There  are no identifiable barriers or psychosocial needs.      Screening Interventions   Interventions To provide support and resources with identified psychosocial needs;Provide feedback about the scores to participant;Encouraged to exercise    Expected Outcomes Short Term goal: Utilizing psychosocial counselor, staff and physician to assist with identification of specific Stressors or current issues interfering with healing process. Setting desired goal for each stressor or current issue identified.;Long Term Goal: Stressors or current issues are controlled or eliminated.;Short Term goal: Identification and review with participant of any Quality of Life or Depression concerns found by scoring the questionnaire.;Long Term goal: The participant improves quality of Life and PHQ9 Scores as  seen by post scores and/or verbalization of changes          Quality of Life Scores:   Quality of Life - 12/21/23 1159       Quality of Life   Select Quality of Life      Quality of Life Scores   Health/Function Pre 24.4 %    Socioeconomic Pre 25.36 %    Psych/Spiritual Pre 24.93 %    Family Pre 28.8 %    GLOBAL Pre 25.35 %         Scores of 19 and below usually indicate a poorer quality of life in these areas.  A difference of  2-3 points is a clinically meaningful difference.  A difference of 2-3 points in the total score of the Quality of Life Index has been associated with significant improvement in overall quality of life, self-image, physical symptoms, and general health in studies assessing change in quality of life.  PHQ-9: Review Flowsheet       12/21/2023  Depression screen PHQ 2/9  Decreased Interest 0  Down, Depressed, Hopeless 0  PHQ - 2 Score 0  Altered sleeping 0  Tired, decreased energy 0  Change in appetite 0  Feeling bad or failure about yourself  0  Trouble concentrating 0  Moving slowly or fidgety/restless 0  Suicidal thoughts 0  PHQ-9 Score 0   Interpretation of Total Score  Total Score Depression Severity:  1-4 = Minimal depression, 5-9 = Mild depression, 10-14 = Moderate depression, 15-19 = Moderately severe depression, 20-27 = Severe depression   Psychosocial Evaluation and Intervention:  Psychosocial Evaluation - 12/17/23 1521       Psychosocial Evaluation & Interventions   Interventions Encouraged to exercise with the program and follow exercise prescription    Comments There are no barriers to attending the program.  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 He has support from his wife.  He is ready to start the program and complete it with an improved fitness level.    Expected Outcomes STG attend all scheduled sessions, work with EP on exercise progression working toward improved fit level/ LTG  improves  fitness level and continued exercise progression    Continue Psychosocial Services  Follow up required by staff          Psychosocial Re-Evaluation:  Psychosocial Re-Evaluation     Row Name 01/11/24 1732 01/27/24 1733           Psychosocial Re-Evaluation   Current issues with None Identified None Identified      Comments Patient reports no issues with their current mental states, sleep, stress, depression or anxiety. Will follow up with patient in a few weeks for any changes. Patient reports that he is sleeping well and has no concerns with  stress or mental health. He continues to exercise consistently in cardiac rehab.      Expected Outcomes Short: Continue to exercise regularly to support mental health and notify staff of any changes. Long: maintain mental health and well being through teaching of rehab or prescribed medications independently. Short: continue to attend cardiac rehab for the mental health benefits of exericse. Long: maintian good mental health routine.      Interventions Encouraged to attend Cardiac Rehabilitation for the exercise Encouraged to attend Cardiac Rehabilitation for the exercise      Continue Psychosocial Services  Follow up required by staff Follow up required by staff         Psychosocial Discharge (Final Psychosocial Re-Evaluation):  Psychosocial Re-Evaluation - 01/27/24 1733       Psychosocial Re-Evaluation   Current issues with None Identified    Comments Patient reports that he is sleeping well and has no concerns with stress or mental health. He continues to exercise consistently in cardiac rehab.    Expected Outcomes Short: continue to attend cardiac rehab for the mental health benefits of exericse. Long: maintian good mental health routine.    Interventions Encouraged to attend Cardiac Rehabilitation for the exercise    Continue Psychosocial Services  Follow up required by staff          Vocational Rehabilitation: Provide vocational rehab  assistance to qualifying candidates.   Vocational Rehab Evaluation & Intervention:  Vocational Rehab - 12/17/23 1441       Initial Vocational Rehab Evaluation & Intervention   Assessment shows need for Vocational Rehabilitation No      Vocational Rehab Re-Evaulation   Comments no request for VR at this time          Education: Education Goals: Education classes will be provided on a variety of topics geared toward better understanding of heart health and risk factor modification. Participant will state understanding/return demonstration of topics presented as noted by education test scores.  Learning Barriers/Preferences:  Learning Barriers/Preferences - 12/17/23 1441       Learning Barriers/Preferences   Learning Barriers None    Learning Preferences None          General Cardiac Education Topics:  AED/CPR: - Group verbal and written instruction with the use of models to demonstrate the basic use of the AED with the basic ABC's of resuscitation.   Test and Procedures: - Group verbal and visual presentation and models provide information about basic cardiac anatomy and function. Reviews the testing methods done to diagnose heart disease and the outcomes of the test results. Describes the treatment choices: Medical Management, Angioplasty, or Coronary Bypass Surgery for treating various heart conditions including Myocardial Infarction, Angina, Valve Disease, and Cardiac Arrhythmias. Written material provided at class time. Flowsheet Row Cardiac Rehab from 02/10/2024 in [facility] Cardiac and Pulmonary Rehab  Education need identified 12/21/23    Medication Safety: - Group verbal and visual instruction to review commonly prescribed medications for heart and lung disease. Reviews the medication, class of the drug, and side effects. Includes the steps to properly store meds and maintain the prescription regimen. Written material provided at class time.   Intimacy: - Group verbal  instruction through game format to discuss how heart and lung disease can affect sexual intimacy. Written material provided at class time.   Know Your Numbers and Heart Failure: - Group verbal and visual instruction to discuss disease risk factors for cardiac and pulmonary disease and treatment options.  Reviews associated critical values for  Overweight/Obesity, Hypertension, Cholesterol, and Diabetes.  Discusses basics of heart failure: signs/symptoms and treatments.  Introduces Heart Failure Zone chart for action plan for heart failure. Written material provided at class time.   Infection Prevention: - Provides verbal and written material to individual with discussion of infection control including proper hand washing and proper equipment cleaning during exercise session. Flowsheet Row Cardiac Rehab from 02/10/2024 in [facility] Cardiac and Pulmonary Rehab  Date 12/21/23  Educator MB  Instruction Review Code 1- Verbalizes Understanding    Falls Prevention: - Provides verbal and written material to individual with discussion of falls prevention and safety. Flowsheet Row Cardiac Rehab from 02/10/2024 in [facility] Cardiac and Pulmonary Rehab  Date 12/21/23  Educator MB  Instruction Review Code 1- Verbalizes Understanding    Other: -Provides group and verbal instruction on various topics (see comments)   Knowledge Questionnaire Score:  Knowledge Questionnaire Score - 12/21/23 1201       Knowledge Questionnaire Score   Pre Score 25/26          Core Components/Risk Factors/Patient Goals at Admission:  Personal Goals and Risk Factors at Admission - 12/21/23 1201       Core Components/Risk Factors/Patient Goals on Admission    Weight Management Yes;Weight Maintenance    Intervention Weight Management: Develop a combined nutrition and exercise program designed to reach desired caloric intake, while maintaining appropriate intake of nutrient and fiber, sodium and fats, and appropriate energy  expenditure required for the weight goal.;Weight Management: Provide education and appropriate resources to help participant work on and attain dietary goals.;Weight Management/Obesity: Establish reasonable short term and long term weight goals.    Admit Weight 182 lb 4.8 oz (82.7 kg)    Goal Weight: Short Term 182 lb 4.8 oz (82.7 kg)    Goal Weight: Long Term 182 lb 4.8 oz (82.7 kg)    Expected Outcomes Short Term: Continue to assess and modify interventions until short term weight is achieved;Long Term: Adherence to nutrition and physical activity/exercise program aimed toward attainment of established weight goal;Weight Maintenance: Understanding of the daily nutrition guidelines, which includes 25-35% calories from fat, 7% or less cal from saturated fats, less than 200mg  cholesterol, less than 1.5gm of sodium, & 5 or more servings of fruits and vegetables daily;Understanding recommendations for meals to include 15-35% energy as protein, 25-35% energy from fat, 35-60% energy from carbohydrates, less than 200mg  of dietary cholesterol, 20-35 gm of total fiber daily;Understanding of distribution of calorie intake throughout the day with the consumption of 4-5 meals/snacks    Hypertension Yes    Intervention Provide education on lifestyle modifcations including regular physical activity/exercise, weight management, moderate sodium restriction and increased consumption of fresh fruit, vegetables, and low fat dairy, alcohol moderation, and smoking cessation.;Monitor prescription use compliance.    Expected Outcomes Short Term: Continued assessment and intervention until BP is < 140/12mm HG in hypertensive participants. < 130/80mm HG in hypertensive participants with diabetes, heart failure or chronic kidney disease.;Long Term: Maintenance of blood pressure at goal levels.    Lipids Yes    Intervention Provide education and support for participant on nutrition & aerobic/resistive exercise along with prescribed  medications to achieve LDL 70mg , HDL >40mg .    Expected Outcomes Short Term: Participant states understanding of desired cholesterol values and is compliant with medications prescribed. Participant is following exercise prescription and nutrition guidelines.;Long Term: Cholesterol controlled with medications as prescribed, with individualized exercise RX and with personalized nutrition plan. Value goals: LDL < 70mg , HDL > 40  mg.          Education:Diabetes - Individual verbal and written instruction to review signs/symptoms of diabetes, desired ranges of glucose level fasting, after meals and with exercise. Acknowledge that pre and post exercise glucose checks will be done for 3 sessions at entry of program.   Core Components/Risk Factors/Patient Goals Review:   Goals and Risk Factor Review     Row Name 01/11/24 1729 01/27/24 1724           Core Components/Risk Factors/Patient Goals Review   Personal Goals Review Hypertension;Weight Management/Obesity;Other Weight Management/Obesity;Hypertension;Lipids      Review Zerek has been doing well in rehab. He checks his blood pressure at home and is maintaining his weight. He has been having issues with his sides/lats tensing up alot. It is a sharp pain that he feels when he exercsies. Athen reports that his weight has been steady and that he takes all his medications to help control blood pressure and cholesterol. He follows up with his doctor regularly to monitor risk factors. He continues to check his BP at home. He does continue to have pain in his sides and has discussed this with his doctor. He states that the thought is this pain is due to his continued healing from open heart surgery.      Expected Outcomes Short: ask his doctor about his side pain. Long: continue to improve side pain. Short: Continue to check BP at home and monitor side pain and inform doctor if this gets worse. Long: become pain free as healing continues. Continue to  control cardiac risk factors.         Core Components/Risk Factors/Patient Goals at Discharge (Final Review):   Goals and Risk Factor Review - 01/27/24 1724       Core Components/Risk Factors/Patient Goals Review   Personal Goals Review Weight Management/Obesity;Hypertension;Lipids    Review Barney reports that his weight has been steady and that he takes all his medications to help control blood pressure and cholesterol. He follows up with his doctor regularly to monitor risk factors. He continues to check his BP at home. He does continue to have pain in his sides and has discussed this with his doctor. He states that the thought is this pain is due to his continued healing from open heart surgery.    Expected Outcomes Short: Continue to check BP at home and monitor side pain and inform doctor if this gets worse. Long: become pain free as healing continues. Continue to control cardiac risk factors.          ITP Comments:  ITP Comments     Row Name 12/17/23 1520 12/21/23 1152 12/23/23 1201 12/23/23 1727 01/20/24 0847   ITP Comments Virtual orientation call completed today. he has an appointment on  0 12/21/2023 for EP eval and gym Orientation.  Documentation of diagnosis can be found in [facility] 11/20/2023 . Completed and gym orientation for cardiac rehab. Initial ITP created and sent for review to Dr. Oneil Pinal, Medical Director. 30 Day review completed. Medical Director ITP review done, changes made as directed, and signed approval by Medical Director. New Patient First full day of exercise!  Patient was oriented to gym and equipment including functions, settings, policies, and procedures.  Patient's individual exercise prescription and treatment plan were reviewed.  All starting workloads were established based on the results of the 6 minute walk test done at initial orientation visit.  The plan for exercise progression was also introduced and progression  will be customized based on patient's  performance and goals. 30 Day review completed. Medical Director ITP review done, changes made as directed, and signed approval by Medical Director. New to program.    Row Name 02/17/24 0931           ITP Comments 30 Day review completed. Medical Director ITP review done; changes made as directed and signed approval by Medical Director.          Comments: 30 day review

## 2024-02-17 NOTE — Progress Notes (Signed)
 Daily Session Note  Patient Details  Name: Chris Shelton MRN: [id number] Date of Birth: 1961/01/25 Referring Provider:   Flowsheet Row Cardiac Rehab from 12/21/2023 in [facility] Cardiac and Pulmonary Rehab  Referring Provider Florencio Kava, MD    Encounter Date: 02/17/2024  Check In:  Session Check In - 02/17/24 1720       Check-In   Supervising physician immediately available to respond to emergencies See telemetry face sheet for immediately available ER MD    Location [facility]-Cardiac & Pulmonary Rehab    Staff Present Burnard Hint BS, ACSM CEP, Exercise Physiologist;Clementine Soulliere RN,BSN;Joseph Rolinda RCP,RRT,BSRT    Virtual Visit No    Medication changes reported     No    Fall or balance concerns reported    No    Warm-up and Cool-down Performed on first and last piece of equipment    Resistance Training Performed Yes    VAD Patient? No    PAD/SET Patient? No      Pain Assessment   Currently in Pain? No/denies             Social History   Tobacco Use  Smoking Status Never  Smokeless Tobacco Never    Goals Met:  Independence with exercise equipment Exercise tolerated well No report of concerns or symptoms today Strength training completed today  Goals Unmet:  Not Applicable  Comments: Pt able to follow exercise prescription today without complaint.  Will continue to monitor for progression.    Dr. Oneil Pinal is Medical Director for [facility] Cardiac Rehabilitation.  Dr. Fuad Aleskerov is Medical Director for [facility] Pulmonary Rehabilitation.

## 2024-02-18 ENCOUNTER — Encounter: Admitting: Emergency Medicine

## 2024-02-18 DIAGNOSIS — I214 Non-ST elevation (NSTEMI) myocardial infarction: Secondary | ICD-10-CM

## 2024-02-18 DIAGNOSIS — Z951 Presence of aortocoronary bypass graft: Secondary | ICD-10-CM

## 2024-02-18 NOTE — Progress Notes (Signed)
 Daily Session Note  Patient Details  Name: Chris Shelton MRN: [id number] Date of Birth: Feb 03, 1961 Referring Provider:   Flowsheet Row Cardiac Rehab from 12/21/2023 in [facility] Cardiac and Pulmonary Rehab  Referring Provider Florencio Kava, MD    Encounter Date: 02/18/2024  Check In:  Session Check In - 02/18/24 1705       Check-In   Supervising physician immediately available to respond to emergencies See telemetry face sheet for immediately available ER MD    Location [facility]-Cardiac & Pulmonary Rehab    Staff Present Fairy Plater RCP,RRT,BSRT;Shermaine Rivet RN,BSN;Maxon Conetta BS, Exercise Physiologist    Virtual Visit No    Medication changes reported     No    Fall or balance concerns reported    No    Warm-up and Cool-down Performed on first and last piece of equipment    Resistance Training Performed Yes    VAD Patient? No    PAD/SET Patient? No      Pain Assessment   Currently in Pain? No/denies             Social History   Tobacco Use  Smoking Status Never  Smokeless Tobacco Never    Goals Met:  Independence with exercise equipment Exercise tolerated well No report of concerns or symptoms today Strength training completed today  Goals Unmet:  Not Applicable  Comments: Pt able to follow exercise prescription today without complaint.  Will continue to monitor for progression.    Dr. Oneil Pinal is Medical Director for [facility] Cardiac Rehabilitation.  Dr. Fuad Aleskerov is Medical Director for [facility] Pulmonary Rehabilitation.

## 2024-02-24 ENCOUNTER — Encounter: Attending: Internal Medicine | Admitting: Emergency Medicine

## 2024-02-24 DIAGNOSIS — Z951 Presence of aortocoronary bypass graft: Secondary | ICD-10-CM | POA: Insufficient documentation

## 2024-02-24 DIAGNOSIS — I214 Non-ST elevation (NSTEMI) myocardial infarction: Secondary | ICD-10-CM | POA: Diagnosis present

## 2024-02-24 NOTE — Progress Notes (Signed)
 Daily Session Note  Patient Details  Name: Chris Shelton MRN: [id number] Date of Birth: [birth year] Referring Provider:   Flowsheet Row Cardiac Rehab from 12/21/2023 in [facility] Cardiac and Pulmonary Rehab  Referring Provider Florencio Kava, MD    Encounter Date: 02/24/2024  Check In:  Session Check In - 02/24/24 1729       Check-In   Supervising physician immediately available to respond to emergencies See telemetry face sheet for immediately available ER MD    Location [facility]-Cardiac & Pulmonary Rehab    Staff Present Fairy Plater RCP,RRT,BSRT;Harmonee Tozer RN,BSN;Kelly Dyane BS, ACSM CEP, Exercise Physiologist    Virtual Visit No    Medication changes reported     No    Fall or balance concerns reported    No    Warm-up and Cool-down Performed on first and last piece of equipment    Resistance Training Performed Yes    VAD Patient? No    PAD/SET Patient? No      Pain Assessment   Currently in Pain? No/denies             Social History   Tobacco Use  Smoking Status Never  Smokeless Tobacco Never    Goals Met:  Independence with exercise equipment Exercise tolerated well No report of concerns or symptoms today Strength training completed today  Goals Unmet:  Not Applicable  Comments: Pt able to follow exercise prescription today without complaint.  Will continue to monitor for progression.    Dr. Oneil Pinal is Medical Director for [facility] Cardiac Rehabilitation.  Dr. Fuad Aleskerov is Medical Director for [facility] Pulmonary Rehabilitation.

## 2024-02-25 ENCOUNTER — Encounter: Admitting: Emergency Medicine

## 2024-02-25 VITALS — Ht 69.5 in | Wt 178.3 lb

## 2024-02-25 DIAGNOSIS — I214 Non-ST elevation (NSTEMI) myocardial infarction: Secondary | ICD-10-CM

## 2024-02-25 DIAGNOSIS — Z951 Presence of aortocoronary bypass graft: Secondary | ICD-10-CM

## 2024-02-25 NOTE — Progress Notes (Signed)
 Daily Session Note  Patient Details  Name: Chris Shelton MRN: [id number] Date of Birth: 01/08/1961 Referring Provider:   Flowsheet Row Cardiac Rehab from 12/21/2023 in [facility] Cardiac and Pulmonary Rehab  Referring Provider Florencio Kava, MD    Encounter Date: 02/25/2024  Check In:  Session Check In - 02/25/24 1717       Check-In   Supervising physician immediately available to respond to emergencies See telemetry face sheet for immediately available ER MD    Location [facility]-Cardiac & Pulmonary Rehab    Staff Present Rollene Paterson, MS, Exercise Physiologist;Joseph Rolinda RCP,RRT,BSRT;Aleph Nickson RN,BSN    Virtual Visit No    Medication changes reported     No    Fall or balance concerns reported    No    Warm-up and Cool-down Performed on first and last piece of equipment    Resistance Training Performed Yes    VAD Patient? No    PAD/SET Patient? No      Pain Assessment   Currently in Pain? No/denies             Social History   Tobacco Use  Smoking Status Never  Smokeless Tobacco Never    Goals Met:  Independence with exercise equipment Exercise tolerated well No report of concerns or symptoms today Strength training completed today  Goals Unmet:  Not Applicable  Comments: Pt able to follow exercise prescription today without complaint.  Will continue to monitor for progression.    Dr. Oneil Pinal is Medical Director for [facility] Cardiac Rehabilitation.  Dr. Fuad Aleskerov is Medical Director for [facility] Pulmonary Rehabilitation.

## 2024-02-25 NOTE — Patient Instructions (Signed)
 Discharge Patient Instructions  Patient Details  Name: Chris Shelton MRN: [id number] Date of Birth: 06-May-1961 Referring Provider:  Rudolpho Norleen BIRCH, MD   Number of Visits: 36  Reason for Discharge:  Patient reached a stable level of exercise. Patient independent in their exercise. Patient has met program and personal goals.  Diagnosis:  NSTEMI (non-ST elevation myocardial infarction) (HCC)  S/P CABG x 3  Initial Exercise Prescription:  Initial Exercise Prescription - 12/21/23 1100       Date of Initial Exercise RX and Referring Provider   Date 12/21/23    Referring Provider Florencio Kava, MD      Oxygen   Maintain Oxygen Saturation 88% or higher      Treadmill   MPH 2.8    Grade 0    Minutes 15    METs 3.14      NuStep   Level 3   T6   SPM 80    Minutes 15    METs 3.83      Elliptical   Level 1    Speed 3    Minutes 15    METs 3.83      REL-XR   Level 3    Speed 50    Minutes 15    METs 3.83      Rower   Level 3    Watts 25    Minutes 15    METs 3.83      Prescription Details   Frequency (times per week) 3    Duration Progress to 30 minutes of continuous aerobic without signs/symptoms of physical distress      Intensity   THRR 40-80% of Max Heartrate 95-136    Ratings of Perceived Exertion 11-13    Perceived Dyspnea 0-4      Progression   Progression Continue to progress workloads to maintain intensity without signs/symptoms of physical distress.      Resistance Training   Training Prescription Yes    Weight 7 lb    Reps 10-15          Discharge Exercise Prescription (Final Exercise Prescription Changes):  Exercise Prescription Changes - 02/17/24 1300       Response to Exercise   Blood Pressure (Admit) 122/60    Blood Pressure (Exit) 102/58    Heart Rate (Admit) 58 bpm    Heart Rate (Exercise) 108 bpm    Heart Rate (Exit) 81 bpm    Rating of Perceived Exertion (Exercise) 14    Symptoms none    Duration Continue with 30  min of aerobic exercise without signs/symptoms of physical distress.    Intensity THRR unchanged      Progression   Progression Continue to progress workloads to maintain intensity without signs/symptoms of physical distress.    Average METs 4.6      Resistance Training   Training Prescription Yes    Weight 10 lb    Reps 10-15      Interval Training   Interval Training No      Treadmill   MPH 3.3    Grade 3    Minutes 15    METs 4.89      NuStep   Level 5   T6   Minutes 15    METs 4.4      Elliptical   Level 4    Speed 4    Minutes 15    METs 6.5      REL-XR   Level 7  Minutes 15      Home Exercise Plan   Plans to continue exercise at Home (comment)   walk, total gym equipment, and yoga   Frequency Add 2 additional days to program exercise sessions.    Initial Home Exercises Provided 01/25/24      Oxygen   Maintain Oxygen Saturation 88% or higher          Functional Capacity:  6 Minute Walk     Row Name 12/21/23 1152 02/25/24 1738       6 Minute Walk   Phase Initial Discharge    Distance 1490 feet 1960 feet    Distance % Change -- 31.54 %    Distance Feet Change -- 470 ft    Walk Time 6 minutes 6 minutes    # of Rest Breaks 0 0    MPH 2.82 3.71    METS 3.83 5.19    RPE 11 14    Perceived Dyspnea  0 0    VO2 Peak 13.4 18.18    Symptoms No No    Resting HR 54 bpm 66 bpm    Resting BP 126/70 120/60    Resting Oxygen Saturation  99 % 98 %    Exercise Oxygen Saturation  during 6 min walk 99 % 98 %    Max Ex. HR 94 bpm 121 bpm    Max Ex. BP 156/82 178/68    2 Minute Post BP 142/80 166/60      Nutrition & Weight - Outcomes:  Pre Biometrics - 12/21/23 1158       Pre Biometrics   Height 5' 9.5 (1.765 m)    Weight 182 lb 4.8 oz (82.7 kg)    Waist Circumference 37.5 inches    Hip Circumference 41 inches    Waist to Hip Ratio 0.91 %    BMI (Calculated) 26.54    Single Leg Stand 30 seconds          Post Biometrics - 02/25/24 1740         Post  Biometrics   Height 5' 9.5 (1.765 m)    Weight 178 lb 4.8 oz (80.9 kg)    Waist Circumference 35.8 inches    Hip Circumference 38 inches    Waist to Hip Ratio 0.94 %    BMI (Calculated) 25.96    Single Leg Stand 30 seconds

## 2024-02-29 ENCOUNTER — Encounter

## 2024-02-29 ENCOUNTER — Encounter: Admitting: Emergency Medicine

## 2024-02-29 DIAGNOSIS — I214 Non-ST elevation (NSTEMI) myocardial infarction: Secondary | ICD-10-CM

## 2024-02-29 DIAGNOSIS — Z951 Presence of aortocoronary bypass graft: Secondary | ICD-10-CM

## 2024-02-29 NOTE — Progress Notes (Signed)
 Daily Session Note  Patient Details  Name: Chris Shelton MRN: [id number] Date of Birth: 1960/06/29 Referring Provider:   Flowsheet Row Cardiac Rehab from 12/21/2023 in [facility] Cardiac and Pulmonary Rehab  Referring Provider Florencio Kava, MD    Encounter Date: 02/29/2024  Check In:  Session Check In - 02/29/24 1731       Check-In   Supervising physician immediately available to respond to emergencies See telemetry face sheet for immediately available ER MD    Location [facility]-Cardiac & Pulmonary Rehab    Staff Present Burnard Hint BS, ACSM CEP, Exercise Physiologist;Joseph Rolinda RCP,RRT,BSRT;Danney Bungert RN,BSN    Virtual Visit No    Medication changes reported     No    Fall or balance concerns reported    No    Warm-up and Cool-down Performed on first and last piece of equipment    Resistance Training Performed Yes    VAD Patient? No    PAD/SET Patient? No      Pain Assessment   Currently in Pain? No/denies             Social History   Tobacco Use  Smoking Status Never  Smokeless Tobacco Never    Goals Met:  Independence with exercise equipment Exercise tolerated well No report of concerns or symptoms today Strength training completed today  Goals Unmet:  Not Applicable  Comments: Pt able to follow exercise prescription today without complaint.  Will continue to monitor for progression.    Dr. Oneil Pinal is Medical Director for [facility] Cardiac Rehabilitation.  Dr. Fuad Aleskerov is Medical Director for [facility] Pulmonary Rehabilitation.

## 2024-02-29 NOTE — Progress Notes (Signed)
 Assessment start time: 4:16 PM  Digestive issues/concerns: no known food allergies   24-hours Recall: B: egg white, spinach, veggie sausage, mutli grain toast  Snack: un salted nuts L: frozen plant based meal D: vegetarian meals   Beverages water  (80-120oz)  Education r/t nutrition plan: Patient drinking mostly water , getting 80-120oz daily. He eats 3 meals per day, sometimes a snack in better a meal. He rarely does meat, almost never red meat. Reviewed Mediterranean diet handout. Educated on types of fats, sources, and how to read facts labels. Brainstormed meals and snack ideas that he likes and will eat, focusing on keeping sodium and saturated fat controlled.     Goal 1: Read labels and reduce sodium intake to below 2300mg . Ideally 1500mg  per day.  Goal 2: Reduce saturated fat, less than 12g per day. Replace bad fats for more heart healthy fats. Goal 3: Eat 15-30gProtein and 30-60gCarbs at each meal.  End time 4:48 PM

## 2024-03-02 ENCOUNTER — Encounter

## 2024-03-02 DIAGNOSIS — I214 Non-ST elevation (NSTEMI) myocardial infarction: Secondary | ICD-10-CM | POA: Diagnosis not present

## 2024-03-02 DIAGNOSIS — Z951 Presence of aortocoronary bypass graft: Secondary | ICD-10-CM

## 2024-03-02 NOTE — Progress Notes (Signed)
 Daily Session Note  Patient Details  Name: Chris Shelton MRN: [id number] Date of Birth: 04/05/1961 Referring Provider:   Flowsheet Row Cardiac Rehab from 12/21/2023 in [facility] Cardiac and Pulmonary Rehab  Referring Provider Florencio Kava, MD    Encounter Date: 03/02/2024  Check In:  Session Check In - 03/02/24 1705       Check-In   Supervising physician immediately available to respond to emergencies See telemetry face sheet for immediately available ER MD    Location [facility]-Cardiac & Pulmonary Rehab    Staff Present Burnard Davenport RN,BSN,MPA;Joseph [facility] Dyane BS, ACSM CEP, Exercise Physiologist    Virtual Visit No    Medication changes reported     No    Fall or balance concerns reported    No    Warm-up and Cool-down Performed on first and last piece of equipment    Resistance Training Performed Yes    VAD Patient? No    PAD/SET Patient? No      Pain Assessment   Currently in Pain? No/denies             Social History   Tobacco Use  Smoking Status Never  Smokeless Tobacco Never    Goals Met:  Independence with exercise equipment Exercise tolerated well No report of concerns or symptoms today Strength training completed today  Goals Unmet:  Not Applicable  Comments: Pt able to follow exercise prescription today without complaint.  Will continue to monitor for progression.    Dr. Oneil Pinal is Medical Director for [facility] Cardiac Rehabilitation.  Dr. Fuad Aleskerov is Medical Director for [facility] Pulmonary Rehabilitation.

## 2024-03-03 ENCOUNTER — Encounter: Admitting: Emergency Medicine

## 2024-03-03 DIAGNOSIS — I214 Non-ST elevation (NSTEMI) myocardial infarction: Secondary | ICD-10-CM

## 2024-03-03 DIAGNOSIS — Z951 Presence of aortocoronary bypass graft: Secondary | ICD-10-CM

## 2024-03-03 NOTE — Progress Notes (Signed)
 Daily Session Note  Patient Details  Name: Chris Shelton MRN: [id number] Date of Birth: 08-14-1960 Referring Provider:   Flowsheet Row Cardiac Rehab from 12/21/2023 in [facility] Cardiac and Pulmonary Rehab  Referring Provider Florencio Kava, MD    Encounter Date: 03/03/2024  Check In:  Session Check In - 03/03/24 1714       Check-In   Supervising physician immediately available to respond to emergencies See telemetry face sheet for immediately available ER MD    Staff Present Rollene Paterson, MS, Exercise Physiologist;Whittaker Lenis RN,BSN;Joseph Rolinda RCP,RRT,BSRT    Virtual Visit No    Medication changes reported     No    Fall or balance concerns reported    No    Warm-up and Cool-down Performed on first and last piece of equipment    Resistance Training Performed Yes    VAD Patient? No    PAD/SET Patient? No      Pain Assessment   Currently in Pain? No/denies             Social History   Tobacco Use  Smoking Status Never  Smokeless Tobacco Never    Goals Met:  Independence with exercise equipment Exercise tolerated well No report of concerns or symptoms today Strength training completed today  Goals Unmet:  Not Applicable  Comments: Pt able to follow exercise prescription today without complaint.  Will continue to monitor for progression.    Dr. Oneil Pinal is Medical Director for [facility] Cardiac Rehabilitation.  Dr. Fuad Aleskerov is Medical Director for [facility] Pulmonary Rehabilitation.

## 2024-03-07 ENCOUNTER — Encounter: Admitting: Emergency Medicine

## 2024-03-07 DIAGNOSIS — I214 Non-ST elevation (NSTEMI) myocardial infarction: Secondary | ICD-10-CM

## 2024-03-07 DIAGNOSIS — Z951 Presence of aortocoronary bypass graft: Secondary | ICD-10-CM

## 2024-03-07 NOTE — Progress Notes (Signed)
 Cardiac Individual Treatment Plan  Patient Details  Name: DEMONT LINFORD MRN: [id number] Date of Birth: 04-25-1961 Referring Provider:   Flowsheet Row Cardiac Rehab from 12/21/2023 in [facility] Cardiac and Pulmonary Rehab  Referring Provider Florencio Kava, MD    Initial Encounter Date:  Flowsheet Row Cardiac Rehab from 12/21/2023 in [facility] Cardiac and Pulmonary Rehab  Date 12/21/23    Visit Diagnosis: NSTEMI (non-ST elevation myocardial infarction) (HCC)  S/P CABG x 3  Patient's Home Medications on Admission:  Current Outpatient Medications:    aspirin  EC 81 MG tablet, Take 1 tablet (81 mg total) by mouth daily. Swallow whole., Disp: 30 tablet, Rfl: 12   atorvastatin  (LIPITOR ) 80 MG tablet, Take 80 mg by mouth at bedtime., Disp: , Rfl:    brimonidine  (ALPHAGAN ) 0.2 % ophthalmic solution, Place 1 drop into both eyes 2 (two) times daily., Disp: , Rfl:    cholecalciferol (VITAMIN D3) 25 MCG (1000 UNIT) tablet, Take 1,000 Units by mouth daily as needed., Disp: , Rfl:    Coenzyme Q10 (CO Q 10 PO), Take 1 capsule by mouth daily as needed., Disp: , Rfl:    furosemide (LASIX) 40 MG tablet, Take 40 mg by mouth daily., Disp: , Rfl:    isosorbide  mononitrate (IMDUR ) 30 MG 24 hr tablet, Take 1 tablet (30 mg total) by mouth daily. (Patient not taking: Reported on 12/17/2023), Disp: 30 tablet, Rfl: 1   metoprolol  tartrate (LOPRESSOR ) 25 MG tablet, Take 12.5 mg by mouth daily., Disp: , Rfl:    Multiple Vitamins-Minerals (MULTIVITAMIN ADULT) CHEW, Chew 1 tablet by mouth daily as needed., Disp: , Rfl:    nitroGLYCERIN  (NITROSTAT ) 0.4 MG SL tablet, Place 1 tablet (0.4 mg total) under the tongue every 5 (five) minutes as needed for chest pain., Disp: 30 tablet, Rfl: 1   Omega-3 Fatty Acids (FISH OIL PO), Take 1 capsule by mouth daily as needed., Disp: , Rfl:    omeprazole  (PRILOSEC ) 20 MG capsule, Take 20 mg by mouth daily., Disp: , Rfl:    potassium chloride SA (KLOR-CON M) 20 MEQ tablet, Take 20 mEq by mouth  daily., Disp: , Rfl:   Past Medical History: Past Medical History:  Diagnosis Date   Cataracts, bilateral 2019   Detached retina    Palpitations    Sinus trouble 2019   Stroke [facility])    TIA (transient ischemic attack)     Tobacco Use: Social History   Tobacco Use  Smoking Status Never  Smokeless Tobacco Never    Labs: Review Flowsheet       Latest Ref Rng & Units 07/10/2021 11/04/2021 12/19/2023  Labs for ITP Cardiac and Pulmonary Rehab  Cholestrol 0 - 200 mg/dL 778  784  94   LDL (calc) 0 - 99 mg/dL 849  856  34   HDL-C >59 mg/dL 39  43  31   Trlycerides <150 mg/dL 840  853  852   Hemoglobin A1c 4.8 - 5.6 % 5.6  - -     Exercise Target Goals: Exercise Program Goal: Individual exercise prescription set using results from initial 6 min walk test and THRR while considering  patient's activity barriers and safety.   Exercise Prescription Goal: Initial exercise prescription builds to 30-45 minutes a day of aerobic activity, 2-3 days per week.  Home exercise guidelines will be given to patient during program as part of exercise prescription that the participant will acknowledge.   Education: Aerobic Exercise: - Group verbal and visual presentation on the components of  exercise prescription. Introduces F.I.T.T principle from ACSM for exercise prescriptions.  Reviews F.I.T.T. principles of aerobic exercise including progression. Written material provided at class time.   Education: Resistance Exercise: - Group verbal and visual presentation on the components of exercise prescription. Introduces F.I.T.T principle from ACSM for exercise prescriptions  Reviews F.I.T.T. principles of resistance exercise including progression. Written material provided at class time.    Education: Exercise & Equipment Safety: - Individual verbal instruction and demonstration of equipment use and safety with use of the equipment. Flowsheet Row Cardiac Rehab from 03/02/2024 in [facility] Cardiac and  Pulmonary Rehab  Date 12/21/23  Educator MB  Instruction Review Code 1- Verbalizes Understanding    Education: Exercise Physiology & General Exercise Guidelines: - Group verbal and written instruction with models to review the exercise physiology of the cardiovascular system and associated critical values. Provides general exercise guidelines with specific guidelines to those with heart or lung disease. Written material provided at class time.   Education: Flexibility, Balance, Mind/Body Relaxation: - Group verbal and visual presentation with interactive activity on the components of exercise prescription. Introduces F.I.T.T principle from ACSM for exercise prescriptions. Reviews F.I.T.T. principles of flexibility and balance exercise training including progression. Also discusses the mind body connection.  Reviews various relaxation techniques to help reduce and manage stress (i.e. Deep breathing, progressive muscle relaxation, and visualization). Balance handout provided to take home. Written material provided at class time.   Activity Barriers & Risk Stratification:  Activity Barriers & Cardiac Risk Stratification - 12/21/23 1153       Activity Barriers & Cardiac Risk Stratification   Activity Barriers None    Cardiac Risk Stratification High          6 Minute Walk:  6 Minute Walk     Row Name 12/21/23 1152 02/25/24 1738       6 Minute Walk   Phase Initial Discharge    Distance 1490 feet 1960 feet    Distance % Change -- 31.54 %    Distance Feet Change -- 470 ft    Walk Time 6 minutes 6 minutes    # of Rest Breaks 0 0    MPH 2.82 3.71    METS 3.83 5.19    RPE 11 14    Perceived Dyspnea  0 0    VO2 Peak 13.4 18.18    Symptoms No No    Resting HR 54 bpm 66 bpm    Resting BP 126/70 120/60    Resting Oxygen Saturation  99 % 98 %    Exercise Oxygen Saturation  during 6 min walk 99 % 98 %    Max Ex. HR 94 bpm 121 bpm    Max Ex. BP 156/82 178/68    2 Minute Post BP  142/80 166/60       Oxygen Initial Assessment:   Oxygen Re-Evaluation:   Oxygen Discharge (Final Oxygen Re-Evaluation):   Initial Exercise Prescription:  Initial Exercise Prescription - 12/21/23 1100       Date of Initial Exercise RX and Referring Provider   Date 12/21/23    Referring Provider Florencio Kava, MD      Oxygen   Maintain Oxygen Saturation 88% or higher      Treadmill   MPH 2.8    Grade 0    Minutes 15    METs 3.14      NuStep   Level 3   T6   SPM 80    Minutes 15  METs 3.83      Elliptical   Level 1    Speed 3    Minutes 15    METs 3.83      REL-XR   Level 3    Speed 50    Minutes 15    METs 3.83      Rower   Level 3    Watts 25    Minutes 15    METs 3.83      Prescription Details   Frequency (times per week) 3    Duration Progress to 30 minutes of continuous aerobic without signs/symptoms of physical distress      Intensity   THRR 40-80% of Max Heartrate 95-136    Ratings of Perceived Exertion 11-13    Perceived Dyspnea 0-4      Progression   Progression Continue to progress workloads to maintain intensity without signs/symptoms of physical distress.      Resistance Training   Training Prescription Yes    Weight 7 lb    Reps 10-15          Perform Capillary Blood Glucose checks as needed.  Exercise Prescription Changes:   Exercise Prescription Changes     Row Name 12/21/23 1100 01/07/24 1100 01/19/24 1500 01/25/24 1700 02/02/24 1500     Response to Exercise   Blood Pressure (Admit) 126/70 122/60 112/54 -- 102/56   Blood Pressure (Exercise) 156/82 158/80 156/72 -- 144/70   Blood Pressure (Exit) 142/80 112/56 106/54 -- 106/60   Heart Rate (Admit) 54 bpm 58 bpm 76 bpm -- 75 bpm   Heart Rate (Exercise) 94 bpm 113 bpm 120 bpm -- 128 bpm   Heart Rate (Exit) 54 bpm 65 bpm 61 bpm -- 78 bpm   Oxygen Saturation (Admit) 99 % -- -- -- --   Oxygen Saturation (Exercise) 99 % -- -- -- --   Oxygen Saturation (Exit) 97 % --  -- -- --   Rating of Perceived Exertion (Exercise) 11 13 15 15 15    Perceived Dyspnea (Exercise) 0 -- -- -- --   Symptoms none none none none none   Comments results 1st 2 weeks of exercise sessions -- -- --   Duration -- Continue with 30 min of aerobic exercise without signs/symptoms of physical distress. Continue with 30 min of aerobic exercise without signs/symptoms of physical distress. Continue with 30 min of aerobic exercise without signs/symptoms of physical distress. Continue with 30 min of aerobic exercise without signs/symptoms of physical distress.   Intensity THRR New THRR unchanged THRR unchanged THRR unchanged THRR unchanged     Progression   Progression -- Continue to progress workloads to maintain intensity without signs/symptoms of physical distress. Continue to progress workloads to maintain intensity without signs/symptoms of physical distress. Continue to progress workloads to maintain intensity without signs/symptoms of physical distress. Continue to progress workloads to maintain intensity without signs/symptoms of physical distress.   Average METs 3.83 3.13 4 4  4.23     Resistance Training   Training Prescription -- Yes Yes Yes Yes   Weight -- 10 lb 10 lb 10 lb 10 lb   Reps -- 10-15 10-15 10-15 10-15     Interval Training   Interval Training -- No No No No     Treadmill   MPH -- 3 2.8 2.8 3   Grade -- 0 2 2 2    Minutes -- 15 15 15 15    METs -- 3.3 3.91 3.91 4.12     NuStep  Level -- 5  T6 5  T6 5  T6 --   Minutes -- 15 15 15  --   METs -- 3 4 4  --     Elliptical   Level -- 1 3 3 4    Speed -- 1 4 4 1    Minutes -- 15 15 15 15    METs -- 4.4 5.5 5.5 6.3     REL-XR   Level -- 6 6 6 4    Minutes -- 15 15 15 15    METs -- 3.4 4.4 4.4 4.7     Home Exercise Plan   Plans to continue exercise at -- -- -- Home (comment)  walk, total gym equipment, and yoga Home (comment)  walk, total gym equipment, and yoga   Frequency -- -- -- Add 2 additional days to  program exercise sessions. Add 2 additional days to program exercise sessions.   Initial Home Exercises Provided -- -- -- 01/25/24 01/25/24     Oxygen   Maintain Oxygen Saturation -- 88% or higher 88% or higher 88% or higher 88% or higher    Row Name 02/17/24 1300 03/03/24 1800           Response to Exercise   Blood Pressure (Admit) 122/60 120/60      Blood Pressure (Exit) 102/58 120/60      Heart Rate (Admit) 58 bpm 64 bpm      Heart Rate (Exercise) 108 bpm 105 bpm      Heart Rate (Exit) 81 bpm 79 bpm      Rating of Perceived Exertion (Exercise) 14 14      Symptoms none none      Duration Continue with 30 min of aerobic exercise without signs/symptoms of physical distress. Continue with 30 min of aerobic exercise without signs/symptoms of physical distress.      Intensity THRR unchanged THRR unchanged        Progression   Progression Continue to progress workloads to maintain intensity without signs/symptoms of physical distress. Continue to progress workloads to maintain intensity without signs/symptoms of physical distress.      Average METs 4.6 5.03        Resistance Training   Training Prescription Yes Yes      Weight 10 lb 10 lb      Reps 10-15 10-15        Interval Training   Interval Training No No        Treadmill   MPH 3.3 3      Grade 3 3      Minutes 15 15      METs 4.89 4.54        NuStep   Level 5  T6 5  T6      Minutes 15 15      METs 4.4 7.3        Elliptical   Level 4 5      Speed 4 4      Minutes 15 15      METs 6.5 6.5        REL-XR   Level 7 7      Minutes 15 15        Home Exercise Plan   Plans to continue exercise at Home (comment)  walk, total gym equipment, and yoga Home (comment)  walk, total gym equipment, and yoga      Frequency Add 2 additional days to program exercise sessions. Add 2 additional days to program exercise sessions.  Initial Home Exercises Provided 01/25/24 01/25/24        Oxygen   Maintain Oxygen Saturation  88% or higher 88% or higher         Exercise Comments:   Exercise Comments     Row Name 12/23/23 1727           Exercise Comments First full day of exercise!  Patient was oriented to gym and equipment including functions, settings, policies, and procedures.  Patient's individual exercise prescription and treatment plan were reviewed.  All starting workloads were established based on the results of the 6 minute walk test done at initial orientation visit.  The plan for exercise progression was also introduced and progression will be customized based on patient's performance and goals.          Exercise Goals and Review:   Exercise Goals     Row Name 12/21/23 1158             Exercise Goals   Increase Physical Activity Yes       Intervention Provide advice, education, support and counseling about physical activity/exercise needs.;Develop an individualized exercise prescription for aerobic and resistive training based on initial evaluation findings, risk stratification, comorbidities and participant's personal goals.       Expected Outcomes Short Term: Attend rehab on a regular basis to increase amount of physical activity.;Long Term: Add in home exercise to make exercise part of routine and to increase amount of physical activity.;Long Term: Exercising regularly at least 3-5 days a week.       Increase Strength and Stamina Yes       Intervention Provide advice, education, support and counseling about physical activity/exercise needs.;Develop an individualized exercise prescription for aerobic and resistive training based on initial evaluation findings, risk stratification, comorbidities and participant's personal goals.       Expected Outcomes Short Term: Increase workloads from initial exercise prescription for resistance, speed, and METs.;Short Term: Perform resistance training exercises routinely during rehab and add in resistance training at home;Long Term: Improve  cardiorespiratory fitness, muscular endurance and strength as measured by increased METs and functional capacity ( )       Able to understand and use rate of perceived exertion (RPE) scale Yes       Intervention Provide education and explanation on how to use RPE scale       Expected Outcomes Short Term: Able to use RPE daily in rehab to express subjective intensity level;Long Term:  Able to use RPE to guide intensity level when exercising independently       Able to understand and use Dyspnea scale Yes       Intervention Provide education and explanation on how to use Dyspnea scale       Expected Outcomes Short Term: Able to use Dyspnea scale daily in rehab to express subjective sense of shortness of breath during exertion;Long Term: Able to use Dyspnea scale to guide intensity level when exercising independently       Knowledge and understanding of Target Heart Rate Range (THRR) Yes       Intervention Provide education and explanation of THRR including how the numbers were predicted and where they are located for reference       Expected Outcomes Short Term: Able to state/look up THRR;Short Term: Able to use daily as guideline for intensity in rehab;Long Term: Able to use THRR to govern intensity when exercising independently       Able to check pulse independently Yes  Intervention Provide education and demonstration on how to check pulse in carotid and radial arteries.;Review the importance of being able to check your own pulse for safety during independent exercise       Expected Outcomes Short Term: Able to explain why pulse checking is important during independent exercise;Long Term: Able to check pulse independently and accurately       Understanding of Exercise Prescription Yes       Intervention Provide education, explanation, and written materials on patient's individual exercise prescription       Expected Outcomes Short Term: Able to explain program exercise prescription;Long  Term: Able to explain home exercise prescription to exercise independently          Exercise Goals Re-Evaluation :  Exercise Goals Re-Evaluation     Row Name 12/23/23 1727 01/07/24 1114 01/19/24 1510 01/25/24 1734 02/02/24 1511     Exercise Goal Re-Evaluation   Exercise Goals Review Increase Physical Activity;Able to understand and use rate of perceived exertion (RPE) scale;Knowledge and understanding of Target Heart Rate Range (THRR);Understanding of Exercise Prescription;Increase Strength and Stamina;Able to understand and use Dyspnea scale;Able to check pulse independently Increase Physical Activity;Understanding of Exercise Prescription;Increase Strength and Stamina Increase Physical Activity;Understanding of Exercise Prescription;Increase Strength and Stamina Increase Physical Activity;Able to understand and use rate of perceived exertion (RPE) scale;Knowledge and understanding of Target Heart Rate Range (THRR);Understanding of Exercise Prescription;Increase Strength and Stamina;Able to understand and use Dyspnea scale;Able to check pulse independently Increase Physical Activity;Understanding of Exercise Prescription;Increase Strength and Stamina   Comments Reviewed RPE and dyspnea scale, THR and program prescription with pt today.  Pt voiced understanding and was given a copy of goals to take home. Eliberto is off to a good start in the program. He tolerated his exercise prescription well and increased his workloads already. He increased his workload on the treadmill to a speed of 3 mph with no incline. He increased to level 5 on the T6 nustep and level 6 on the XR. He worked at level 1 on the elliptical. He also increased his handweights to 10lbs. We will continue to monitor his progress in the program. Maksymilian is doing well in rehab. He increased his workload on the treadmill to a speed of 2.8 mph and incline of 2%. He increased to level 3 on the elliptical with a speed of 4 mph. He maintained level 6  on the XR and level 5 on the T6 nustep. We will continue to monitor his progress in the program. Reviewed home exercise with pt today.  Pt plans to walk, use his total gym equipment, and do yoga for exercise.  Reviewed THR, pulse, RPE, sign and symptoms, pulse oximetery and when to call 911 or MD.  Also discussed weather considerations and indoor options.  Pt voiced understanding. Ural continues to do well in rehab. He increased his workload on the treadmill to a speed of 3 mph with a 2% incline. He increased to level 4 on the elliptical. He worked at level 4 on the XR in this review. We will continue to monitor his progress in the program.   Expected Outcomes Short: Use RPE daily to regulate intensity. Long: Follow program prescription in THR. Short: Continue to follow current exercise prescription. Long: Continue exercise to improve strength and stamina. Short: Continue to progressively increase treadmill and XR workloads. Long: Continue exercise to improve strength and stamina. Short: add 1-2 days a week of exercise on off days of cardiac rehab. Long: maintain independent exercise routine  upon graduation from cardiac rehab. Short: Continue to progressively increase elliptical workload. Long: Continue exercise to improve strength and stamina.    Row Name 02/17/24 1355 02/29/24 1756 03/03/24 1822         Exercise Goal Re-Evaluation   Exercise Goals Review Increase Physical Activity;Understanding of Exercise Prescription;Increase Strength and Stamina Increase Physical Activity;Increase Strength and Stamina;Understanding of Exercise Prescription Increase Physical Activity;Increase Strength and Stamina;Understanding of Exercise Prescription     Comments Hashem is doing well in rehab. He was recently able to increase his treadmill workload to a speed of 3. and 3% incline. He was also able to increase his from level 4 to 7 on the XR. We will continue to monitor his progress in the program. Francis improved on  his 6 MWT by 470 ft. He will graduate soon from cardiac rehab and plans to join [employer] to continue with his exercise routine upon graduation. Patton continues to do well in rehab and will graduate soon. He improved on his post by 31.54%. He also increased to level 5 on the elliptical with a speed of 4 mph. We will continue to monitor his progress in the program until graduation.     Expected Outcomes Short: Continue to progressively increase treadmill workload. Long: Continue exercise to improve strength and stamina. Short: graduate from cardiac rehab. Long: maintain independent exercise routine. Short: Graduate. Long: Continue to exercise independently.        Discharge Exercise Prescription (Final Exercise Prescription Changes):  Exercise Prescription Changes - 03/03/24 1800       Response to Exercise   Blood Pressure (Admit) 120/60    Blood Pressure (Exit) 120/60    Heart Rate (Admit) 64 bpm    Heart Rate (Exercise) 105 bpm    Heart Rate (Exit) 79 bpm    Rating of Perceived Exertion (Exercise) 14    Symptoms none    Duration Continue with 30 min of aerobic exercise without signs/symptoms of physical distress.    Intensity THRR unchanged      Progression   Progression Continue to progress workloads to maintain intensity without signs/symptoms of physical distress.    Average METs 5.03      Resistance Training   Training Prescription Yes    Weight 10 lb    Reps 10-15      Interval Training   Interval Training No      Treadmill   MPH 3    Grade 3    Minutes 15    METs 4.54      NuStep   Level 5   T6   Minutes 15    METs 7.3      Elliptical   Level 5    Speed 4    Minutes 15    METs 6.5      REL-XR   Level 7    Minutes 15      Home Exercise Plan   Plans to continue exercise at Home (comment)   walk, total gym equipment, and yoga   Frequency Add 2 additional days to program exercise sessions.    Initial Home Exercises Provided 01/25/24      Oxygen    Maintain Oxygen Saturation 88% or higher          Nutrition:  Target Goals: Understanding of nutrition guidelines, daily intake of sodium 1500mg , cholesterol 200mg , calories 30% from fat and 7% or less from saturated fats, daily to have 5 or more servings of fruits and vegetables.  Education: Nutrition 1 -Group instruction provided by verbal, written material, interactive activities, discussions, models, and posters to present general guidelines for heart healthy nutrition including macronutrients, label reading, and promoting whole foods over processed counterparts. Education serves as Pensions consultant of discussion of heart healthy eating for all. Written material provided at class time.    Education: Nutrition 2 -Group instruction provided by verbal, written material, interactive activities, discussions, models, and posters to present general guidelines for heart healthy nutrition including sodium, cholesterol, and saturated fat. Providing guidance of habit forming to improve blood pressure, cholesterol, and body weight. Written material provided at class time. Flowsheet Row Cardiac Rehab from 03/02/2024 in [facility] Cardiac and Pulmonary Rehab  Date 02/10/24  Educator jg  Instruction Review Code 1- Verbalizes Understanding      Biometrics:  Pre Biometrics - 12/21/23 1158       Pre Biometrics   Height 5' 9.5 (1.765 m)    Weight 182 lb 4.8 oz (82.7 kg)    Waist Circumference 37.5 inches    Hip Circumference 41 inches    Waist to Hip Ratio 0.91 %    BMI (Calculated) 26.54    Single Leg Stand 30 seconds          Post Biometrics - 02/25/24 1740        Post  Biometrics   Height 5' 9.5 (1.765 m)    Weight 178 lb 4.8 oz (80.9 kg)    Waist Circumference 35.8 inches    Hip Circumference 38 inches    Waist to Hip Ratio 0.94 %    BMI (Calculated) 25.96    Single Leg Stand 30 seconds          Nutrition Therapy Plan and Nutrition Goals:  Nutrition Therapy & Goals - 02/29/24  1649       Nutrition Therapy   Diet Cardiac    Protein (specify units) 80    Fiber 30 grams    Whole Grain Foods 3 servings    Saturated Fats 15 max. grams    Fruits and Vegetables 5 servings/day    Sodium 2 grams      Personal Nutrition Goals   Nutrition Goal Read labels and reduce sodium intake to below 2300mg . Ideally 1500mg  per day.    Personal Goal #2 Reduce saturated fat, less than 12g per day. Replace bad fats for more heart healthy fats.    Personal Goal #3 Eat 15-30gProtein and 30-60gCarbs at each meal.    Comments Patient drinking mostly water , getting 80-120oz daily. He eats 3 meals per day, sometimes a snack in better a meal. He rarely does meat, almost never red meat. Reviewed Mediterranean diet handout. Educated on types of fats, sources, and how to read facts labels. Brainstormed meals and snack ideas that he likes and will eat, focusing on keeping sodium and saturated fat controlled.      Intervention Plan   Intervention Prescribe, educate and counsel regarding individualized specific dietary modifications aiming towards targeted core components such as weight, hypertension, lipid management, diabetes, heart failure and other comorbidities.;Nutrition handout(s) given to patient.    Expected Outcomes Short Term Goal: Understand basic principles of dietary content, such as calories, fat, sodium, cholesterol and nutrients.;Short Term Goal: A plan has been developed with personal nutrition goals set during dietitian appointment.;Long Term Goal: Adherence to prescribed nutrition plan.          Nutrition Assessments:  MEDIFICTS Score Key: >=70 Need to make dietary changes  40-70 Heart Healthy Diet <= 40  Therapeutic Level Cholesterol Diet  Flowsheet Row Cardiac Rehab from 12/21/2023 in [facility] Cardiac and Pulmonary Rehab  Picture Your Plate Total Score on Admission 83   Picture Your Plate Scores: <59 Unhealthy dietary pattern with much room for improvement. 41-50 Dietary  pattern unlikely to meet recommendations for good health and room for improvement. 51-60 More healthful dietary pattern, with some room for improvement.  >60 Healthy dietary pattern, although there may be some specific behaviors that could be improved.    Nutrition Goals Re-Evaluation:  Nutrition Goals Re-Evaluation     Row Name 01/11/24 1732 01/27/24 1733           Goals   Nutrition Goal RD appointment deferred. RD appointment deferred.         Nutrition Goals Discharge (Final Nutrition Goals Re-Evaluation):  Nutrition Goals Re-Evaluation - 01/27/24 1733       Goals   Nutrition Goal RD appointment deferred.          Psychosocial: Target Goals: Acknowledge presence or absence of significant depression and/or stress, maximize coping skills, provide positive support system. Participant is able to verbalize types and ability to use techniques and skills needed for reducing stress and depression.   Education: Stress, Anxiety, and Depression - Group verbal and visual presentation to define topics covered.  Reviews how body is impacted by stress, anxiety, and depression.  Also discusses healthy ways to reduce stress and to treat/manage anxiety and depression. Written material provided at class time.   Education: Sleep Hygiene -Provides group verbal and written instruction about how sleep can affect your health.  Define sleep hygiene, discuss sleep cycles and impact of sleep habits. Review good sleep hygiene tips.   Initial Review & Psychosocial Screening:  Initial Psych Review & Screening - 12/17/23 1440       Initial Review   Current issues with None Identified      Family Dynamics   Good Support System? Yes   wife, family     Barriers   Psychosocial barriers to participate in program There are no identifiable barriers or psychosocial needs.      Screening Interventions   Interventions To provide support and resources with identified psychosocial needs;Provide  feedback about the scores to participant;Encouraged to exercise    Expected Outcomes Short Term goal: Utilizing psychosocial counselor, staff and physician to assist with identification of specific Stressors or current issues interfering with healing process. Setting desired goal for each stressor or current issue identified.;Long Term Goal: Stressors or current issues are controlled or eliminated.;Short Term goal: Identification and review with participant of any Quality of Life or Depression concerns found by scoring the questionnaire.;Long Term goal: The participant improves quality of Life and PHQ9 Scores as seen by post scores and/or verbalization of changes          Quality of Life Scores:   Quality of Life - 12/21/23 1159       Quality of Life   Select Quality of Life      Quality of Life Scores   Health/Function Pre 24.4 %    Socioeconomic Pre 25.36 %    Psych/Spiritual Pre 24.93 %    Family Pre 28.8 %    GLOBAL Pre 25.35 %         Scores of 19 and below usually indicate a poorer quality of life in these areas.  A difference of  2-3 points is a clinically meaningful difference.  A difference of 2-3 points in the total score of the  Quality of Life Index has been associated with significant improvement in overall quality of life, self-image, physical symptoms, and general health in studies assessing change in quality of life.  PHQ-9: Review Flowsheet       12/21/2023  Depression screen PHQ 2/9  Decreased Interest 0  Down, Depressed, Hopeless 0  PHQ - 2 Score 0  Altered sleeping 0  Tired, decreased energy 0  Change in appetite 0  Feeling bad or failure about yourself  0  Trouble concentrating 0  Moving slowly or fidgety/restless 0  Suicidal thoughts 0  PHQ-9 Score 0   Interpretation of Total Score  Total Score Depression Severity:  1-4 = Minimal depression, 5-9 = Mild depression, 10-14 = Moderate depression, 15-19 = Moderately severe depression, 20-27 = Severe  depression   Psychosocial Evaluation and Intervention:  Psychosocial Evaluation - 12/17/23 1521       Psychosocial Evaluation & Interventions   Interventions Encouraged to exercise with the program and follow exercise prescription    Comments There are no barriers to attending the program.  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 He has support from his wife.  He is ready to start the program and complete it with an improved fitness level.    Expected Outcomes STG attend all scheduled sessions, work with EP on exercise progression working toward improved fit level/ LTG  improves fitness level and continued exercise progression    Continue Psychosocial Services  Follow up required by staff          Psychosocial Re-Evaluation:  Psychosocial Re-Evaluation     Row Name 01/11/24 1732 01/27/24 1733 03/02/24 1737         Psychosocial Re-Evaluation   Current issues with None Identified None Identified None Identified     Comments Patient reports no issues with their current mental states, sleep, stress, depression or anxiety. Will follow up with patient in a few weeks for any changes. Patient reports that he is sleeping well and has no concerns with stress or mental health. He continues to exercise consistently in cardiac rehab. Patient reports that he is sleeping well and has no concerns with stress or mental health. He continues to exercise consistently in cardiac rehab.     Expected Outcomes Short: Continue to exercise regularly to support mental health and notify staff of any changes. Long: maintain mental health and well being through teaching of rehab or prescribed medications independently. Short: continue to attend cardiac rehab for the mental health benefits of exericse. Long: maintian good mental health routine. Short: continue to attend cardiac rehab for the mental health benefits of exericse. Long: maintian good mental health routine.     Interventions  Encouraged to attend Cardiac Rehabilitation for the exercise Encouraged to attend Cardiac Rehabilitation for the exercise Encouraged to attend Cardiac Rehabilitation for the exercise     Continue Psychosocial Services  Follow up required by staff Follow up required by staff Follow up required by staff        Psychosocial Discharge (Final Psychosocial Re-Evaluation):  Psychosocial Re-Evaluation - 03/02/24 1737       Psychosocial Re-Evaluation   Current issues with None Identified    Comments Patient reports that he is sleeping well and has no concerns with stress or mental health. He continues to exercise consistently in cardiac rehab.    Expected Outcomes Short: continue to attend cardiac rehab for the mental health benefits of exericse. Long: maintian good mental health routine.  Interventions Encouraged to attend Cardiac Rehabilitation for the exercise    Continue Psychosocial Services  Follow up required by staff          Vocational Rehabilitation: Provide vocational rehab assistance to qualifying candidates.   Vocational Rehab Evaluation & Intervention:  Vocational Rehab - 12/17/23 1441       Initial Vocational Rehab Evaluation & Intervention   Assessment shows need for Vocational Rehabilitation No      Vocational Rehab Re-Evaulation   Comments no request for VR at this time          Education: Education Goals: Education classes will be provided on a variety of topics geared toward better understanding of heart health and risk factor modification. Participant will state understanding/return demonstration of topics presented as noted by education test scores.  Learning Barriers/Preferences:  Learning Barriers/Preferences - 12/17/23 1441       Learning Barriers/Preferences   Learning Barriers None    Learning Preferences None          General Cardiac Education Topics:  AED/CPR: - Group verbal and written instruction with the use of models to demonstrate the  basic use of the AED with the basic ABC's of resuscitation.   Test and Procedures: - Group verbal and visual presentation and models provide information about basic cardiac anatomy and function. Reviews the testing methods done to diagnose heart disease and the outcomes of the test results. Describes the treatment choices: Medical Management, Angioplasty, or Coronary Bypass Surgery for treating various heart conditions including Myocardial Infarction, Angina, Valve Disease, and Cardiac Arrhythmias. Written material provided at class time. Flowsheet Row Cardiac Rehab from 03/02/2024 in [facility] Cardiac and Pulmonary Rehab  Education need identified 12/21/23    Medication Safety: - Group verbal and visual instruction to review commonly prescribed medications for heart and lung disease. Reviews the medication, class of the drug, and side effects. Includes the steps to properly store meds and maintain the prescription regimen. Written material provided at class time. Flowsheet Row Cardiac Rehab from 03/02/2024 in [facility] Cardiac and Pulmonary Rehab  Date 02/24/24  Educator [facility]  Instruction Review Code 1- Verbalizes Understanding    Intimacy: - Group verbal instruction through game format to discuss how heart and lung disease can affect sexual intimacy. Written material provided at class time.   Know Your Numbers and Heart Failure: - Group verbal and visual instruction to discuss disease risk factors for cardiac and pulmonary disease and treatment options.  Reviews associated critical values for Overweight/Obesity, Hypertension, Cholesterol, and Diabetes.  Discusses basics of heart failure: signs/symptoms and treatments.  Introduces Heart Failure Zone chart for action plan for heart failure. Written material provided at class time. Flowsheet Row Cardiac Rehab from 03/02/2024 in [facility] Cardiac and Pulmonary Rehab  Date 03/02/24  Educator mc  Instruction Review Code 1- Verbalizes Understanding    Infection  Prevention: - Provides verbal and written material to individual with discussion of infection control including proper hand washing and proper equipment cleaning during exercise session. Flowsheet Row Cardiac Rehab from 03/02/2024 in [facility] Cardiac and Pulmonary Rehab  Date 12/21/23  Educator MB  Instruction Review Code 1- Verbalizes Understanding    Falls Prevention: - Provides verbal and written material to individual with discussion of falls prevention and safety. Flowsheet Row Cardiac Rehab from 03/02/2024 in [facility] Cardiac and Pulmonary Rehab  Date 12/21/23  Educator MB  Instruction Review Code 1- Verbalizes Understanding    Other: -Provides group and verbal instruction on various topics (see comments)  Knowledge Questionnaire Score:  Knowledge Questionnaire Score - 12/21/23 1201       Knowledge Questionnaire Score   Pre Score 25/26          Core Components/Risk Factors/Patient Goals at Admission:  Personal Goals and Risk Factors at Admission - 12/21/23 1201       Core Components/Risk Factors/Patient Goals on Admission    Weight Management Yes;Weight Maintenance    Intervention Weight Management: Develop a combined nutrition and exercise program designed to reach desired caloric intake, while maintaining appropriate intake of nutrient and fiber, sodium and fats, and appropriate energy expenditure required for the weight goal.;Weight Management: Provide education and appropriate resources to help participant work on and attain dietary goals.;Weight Management/Obesity: Establish reasonable short term and long term weight goals.    Admit Weight 182 lb 4.8 oz (82.7 kg)    Goal Weight: Short Term 182 lb 4.8 oz (82.7 kg)    Goal Weight: Long Term 182 lb 4.8 oz (82.7 kg)    Expected Outcomes Short Term: Continue to assess and modify interventions until short term weight is achieved;Long Term: Adherence to nutrition and physical activity/exercise program aimed toward attainment of  established weight goal;Weight Maintenance: Understanding of the daily nutrition guidelines, which includes 25-35% calories from fat, 7% or less cal from saturated fats, less than 200mg  cholesterol, less than 1.5gm of sodium, & 5 or more servings of fruits and vegetables daily;Understanding recommendations for meals to include 15-35% energy as protein, 25-35% energy from fat, 35-60% energy from carbohydrates, less than 200mg  of dietary cholesterol, 20-35 gm of total fiber daily;Understanding of distribution of calorie intake throughout the day with the consumption of 4-5 meals/snacks    Hypertension Yes    Intervention Provide education on lifestyle modifcations including regular physical activity/exercise, weight management, moderate sodium restriction and increased consumption of fresh fruit, vegetables, and low fat dairy, alcohol moderation, and smoking cessation.;Monitor prescription use compliance.    Expected Outcomes Short Term: Continued assessment and intervention until BP is < 140/36mm HG in hypertensive participants. < 130/44mm HG in hypertensive participants with diabetes, heart failure or chronic kidney disease.;Long Term: Maintenance of blood pressure at goal levels.    Lipids Yes    Intervention Provide education and support for participant on nutrition & aerobic/resistive exercise along with prescribed medications to achieve LDL 70mg , HDL >40mg .    Expected Outcomes Short Term: Participant states understanding of desired cholesterol values and is compliant with medications prescribed. Participant is following exercise prescription and nutrition guidelines.;Long Term: Cholesterol controlled with medications as prescribed, with individualized exercise RX and with personalized nutrition plan. Value goals: LDL < 70mg , HDL > 40 mg.          Education:Diabetes - Individual verbal and written instruction to review signs/symptoms of diabetes, desired ranges of glucose level fasting, after meals  and with exercise. Acknowledge that pre and post exercise glucose checks will be done for 3 sessions at entry of program.   Core Components/Risk Factors/Patient Goals Review:   Goals and Risk Factor Review     Row Name 01/11/24 1729 01/27/24 1724 03/02/24 1738         Core Components/Risk Factors/Patient Goals Review   Personal Goals Review Hypertension;Weight Management/Obesity;Other Weight Management/Obesity;Hypertension;Lipids Other     Review Halen has been doing well in rehab. He checks his blood pressure at home and is maintaining his weight. He has been having issues with his sides/lats tensing up alot. It is a sharp pain that he feels when he exercsies.  Ranell reports that his weight has been steady and that he takes all his medications to help control blood pressure and cholesterol. He follows up with his doctor regularly to monitor risk factors. He continues to check his BP at home. He does continue to have pain in his sides and has discussed this with his doctor. He states that the thought is this pain is due to his continued healing from open heart surgery. Wyndham did well in the program and is going to continue exercising at home and [employer]. He has no questions or concerns about his medications.     Expected Outcomes Short: ask his doctor about his side pain. Long: continue to improve side pain. Short: Continue to check BP at home and monitor side pain and inform doctor if this gets worse. Long: become pain free as healing continues. Continue to control cardiac risk factors. Short: Educational psychologist. Long: continue to maintain exercise independently.        Core Components/Risk Factors/Patient Goals at Discharge (Final Review):   Goals and Risk Factor Review - 03/02/24 1738       Core Components/Risk Factors/Patient Goals Review   Personal Goals Review Other    Review Leodan did well in the program and is going to continue exercising at home and [employer]. He has no  questions or concerns about his medications.    Expected Outcomes Short: Graduate [employer]. Long: continue to maintain exercise independently.          ITP Comments:  ITP Comments     Row Name 12/17/23 1520 12/21/23 1152 12/23/23 1201 12/23/23 1727 01/20/24 0847   ITP Comments Virtual orientation call completed today. he has an appointment on  0 12/21/2023 for EP eval and gym Orientation.  Documentation of diagnosis can be found in [facility] 11/20/2023 . Completed and gym orientation for cardiac rehab. Initial ITP created and sent for review to Dr. Oneil Pinal, Medical Director. 30 Day review completed. Medical Director ITP review done, changes made as directed, and signed approval by Medical Director. New Patient First full day of exercise!  Patient was oriented to gym and equipment including functions, settings, policies, and procedures.  Patient's individual exercise prescription and treatment plan were reviewed.  All starting workloads were established based on the results of the 6 minute walk test done at initial orientation visit.  The plan for exercise progression was also introduced and progression will be customized based on patient's performance and goals. 30 Day review completed. Medical Director ITP review done, changes made as directed, and signed approval by Medical Director. New to program.    Row Name 02/17/24 0931           ITP Comments 30 Day review completed. Medical Director ITP review done; changes made as directed and signed approval by Medical Director.          Comments: Discharge ITP

## 2024-03-07 NOTE — Progress Notes (Signed)
 Discharge Summary   Chris Shelton  DOB 06-03-1961   Chris Shelton graduated today from  rehab with 36 sessions completed.  Details of the patient's exercise prescription and what He needs to do in order to continue the prescription and progress were discussed with patient.  Patient was given a copy of prescription and goals.  Patient verbalized understanding. Chris Shelton plans to continue to exercise by alking, using his total gym and doing yoga.   6 Minute Walk     Row Name 12/21/23 1152 02/25/24 1738       6 Minute Walk   Phase Initial Discharge    Distance 1490 feet 1960 feet    Distance % Change -- 31.54 %    Distance Feet Change -- 470 ft    Walk Time 6 minutes 6 minutes    # of Rest Breaks 0 0    MPH 2.82 3.71    METS 3.83 5.19    RPE 11 14    Perceived Dyspnea  0 0    VO2 Peak 13.4 18.18    Symptoms No No    Resting HR 54 bpm 66 bpm    Resting BP 126/70 120/60    Resting Oxygen Saturation  99 % 98 %    Exercise Oxygen Saturation  during 6 min walk 99 % 98 %    Max Ex. HR 94 bpm 121 bpm    Max Ex. BP 156/82 178/68    2 Minute Post BP 142/80 166/60

## 2024-03-07 NOTE — Progress Notes (Signed)
 Daily Session Note  Patient Details  Name: DOSS CYBULSKI MRN: [id number] Date of Birth: 11/18/1960 Referring Provider:   Flowsheet Row Cardiac Rehab from 12/21/2023 in [facility] Cardiac and Pulmonary Rehab  Referring Provider Florencio Kava, MD    Encounter Date: 03/07/2024  Check In:  Session Check In - 03/07/24 1725       Check-In   Supervising physician immediately available to respond to emergencies See telemetry face sheet for immediately available ER MD    Location [facility]-Cardiac & Pulmonary Rehab    Staff Present Burnard Hint BS, ACSM CEP, Exercise Physiologist;Joseph Rolinda RCP,RRT,BSRT;Vinod Mikesell RN,BSN    Virtual Visit No    Medication changes reported     No    Fall or balance concerns reported    No    Warm-up and Cool-down Performed on first and last piece of equipment    Resistance Training Performed Yes    VAD Patient? No    PAD/SET Patient? No      Pain Assessment   Currently in Pain? No/denies             Social History   Tobacco Use  Smoking Status Never  Smokeless Tobacco Never    Goals Met:  Independence with exercise equipment Exercise tolerated well No report of concerns or symptoms today Strength training completed today  Goals Unmet:  Not Applicable  Comments:  Martine graduated today from  rehab with 36 sessions completed.  Details of the patient's exercise prescription and what He needs to do in order to continue the prescription and progress were discussed with patient.  Patient was given a copy of prescription and goals.  Patient verbalized understanding. Dmauri plans to continue to exercise by walking, using his total gym and doing yoga.    Dr. Oneil Pinal is Medical Director for [facility] Cardiac Rehabilitation.  Dr. Fuad Aleskerov is Medical Director for [facility] Pulmonary Rehabilitation.

## 2024-03-09 ENCOUNTER — Ambulatory Visit

## 2024-03-10 ENCOUNTER — Ambulatory Visit

## 2024-03-14 ENCOUNTER — Encounter

## 2024-03-16 ENCOUNTER — Encounter

## 2024-03-17 ENCOUNTER — Ambulatory Visit

## 2024-03-21 ENCOUNTER — Ambulatory Visit
